# Patient Record
Sex: Male | Born: 1960 | Race: White | Hispanic: No | Smoking: Former smoker
Health system: Southern US, Community
[De-identification: ages and names within clinical notes are randomized; demographics above are authoritative.]

## PROBLEM LIST (undated history)

## (undated) DIAGNOSIS — N390 Urinary tract infection, site not specified: Secondary | ICD-10-CM

## (undated) DIAGNOSIS — I10 Essential (primary) hypertension: Secondary | ICD-10-CM

## (undated) DIAGNOSIS — E119 Type 2 diabetes mellitus without complications: Secondary | ICD-10-CM

## (undated) HISTORY — PX: FOOT AMPUTATION: SHX951

## (undated) HISTORY — PX: CORONARY ANGIOPLASTY WITH STENT PLACEMENT: SHX49

## (undated) HISTORY — PX: HERNIA REPAIR: SHX51

---

## 2007-07-24 ENCOUNTER — Emergency Department: Payer: Self-pay | Admitting: Emergency Medicine

## 2007-08-26 ENCOUNTER — Emergency Department: Payer: Self-pay | Admitting: Emergency Medicine

## 2008-01-19 ENCOUNTER — Emergency Department: Payer: Self-pay | Admitting: Emergency Medicine

## 2008-03-15 ENCOUNTER — Emergency Department: Payer: Self-pay | Admitting: Emergency Medicine

## 2009-04-25 ENCOUNTER — Emergency Department: Payer: Self-pay | Admitting: Emergency Medicine

## 2009-09-09 ENCOUNTER — Emergency Department: Payer: Self-pay

## 2010-02-23 ENCOUNTER — Emergency Department: Payer: Self-pay | Admitting: Emergency Medicine

## 2013-10-01 ENCOUNTER — Emergency Department: Payer: Self-pay | Admitting: Emergency Medicine

## 2013-10-01 LAB — URINALYSIS, COMPLETE
Bilirubin,UR: NEGATIVE
Glucose,UR: >=500 mg/dL (ref 0–75)
KETONE: NEGATIVE
Leukocyte Esterase: NEGATIVE
Nitrite: NEGATIVE
Ph: 5 (ref 4.5–8.0)
Protein: NEGATIVE
Specific Gravity: 1.032 (ref 1.003–1.030)
Squamous Epithelial: 1
WBC UR: <1 /HPF (ref 0–5)

## 2013-10-01 LAB — CBC WITH DIFFERENTIAL/PLATELET
BASOS ABS: 0 10*3/uL (ref 0.0–0.1)
Basophil %: 0.4 %
EOS PCT: 0.9 %
Eosinophil #: 0.1 10*3/uL (ref 0.0–0.7)
HCT: 43.9 % (ref 40.0–52.0)
HGB: 14.9 g/dL (ref 13.0–18.0)
Lymphocyte #: 1.9 10*3/uL (ref 1.0–3.6)
Lymphocyte %: 21 %
MCH: 33.1 pg (ref 26.0–34.0)
MCHC: 34.1 g/dL (ref 32.0–36.0)
MCV: 97 fL (ref 80–100)
MONO ABS: 0.6 x10 3/mm (ref 0.2–1.0)
Monocyte %: 7.1 %
Neutrophil #: 6.2 10*3/uL (ref 1.4–6.5)
Neutrophil %: 70.6 %
PLATELETS: 306 10*3/uL (ref 150–440)
RBC: 4.52 10*6/uL (ref 4.40–5.90)
RDW: 12.8 % (ref 11.5–14.5)
WBC: 8.8 10*3/uL (ref 3.8–10.6)

## 2013-10-01 LAB — COMPREHENSIVE METABOLIC PANEL
ALT: 23 U/L (ref 12–78)
AST: 14 U/L — AB (ref 15–37)
Albumin: 3.3 g/dL — ABNORMAL LOW (ref 3.4–5.0)
Alkaline Phosphatase: 129 U/L — ABNORMAL HIGH
Anion Gap: 4 — ABNORMAL LOW (ref 7–16)
BILIRUBIN TOTAL: 0.4 mg/dL (ref 0.2–1.0)
BUN: 13 mg/dL (ref 7–18)
Calcium, Total: 8.3 mg/dL — ABNORMAL LOW (ref 8.5–10.1)
Chloride: 100 mmol/L (ref 98–107)
Co2: 30 mmol/L (ref 21–32)
Creatinine: 0.84 mg/dL (ref 0.60–1.30)
EGFR (African American): >60
GLUCOSE: 490 mg/dL — AB (ref 65–99)
Osmolality: 290 (ref 275–301)
Potassium: 4 mmol/L (ref 3.5–5.1)
SODIUM: 134 mmol/L — AB (ref 136–145)
TOTAL PROTEIN: 7.3 g/dL (ref 6.4–8.2)

## 2013-10-01 LAB — D-DIMER(ARMC): D-Dimer: 231 ng/ml

## 2013-10-01 LAB — TROPONIN I: Troponin-I: <0.02 ng/mL

## 2019-07-16 ENCOUNTER — Encounter: Payer: Self-pay | Admitting: Emergency Medicine

## 2019-07-16 ENCOUNTER — Emergency Department
Admission: EM | Admit: 2019-07-16 | Discharge: 2019-07-16 | Disposition: A | Discharge status: Home / Self Care | Payer: Medicaid Other | Attending: Student | Admitting: Student

## 2019-07-16 ENCOUNTER — Other Ambulatory Visit: Payer: Self-pay

## 2019-07-16 DIAGNOSIS — Z760 Encounter for issue of repeat prescription: Secondary | ICD-10-CM | POA: Diagnosis not present

## 2019-07-16 DIAGNOSIS — Z794 Long term (current) use of insulin: Secondary | ICD-10-CM | POA: Insufficient documentation

## 2019-07-16 DIAGNOSIS — Z79899 Other long term (current) drug therapy: Secondary | ICD-10-CM | POA: Insufficient documentation

## 2019-07-16 DIAGNOSIS — Z7901 Long term (current) use of anticoagulants: Secondary | ICD-10-CM | POA: Insufficient documentation

## 2019-07-16 DIAGNOSIS — I10 Essential (primary) hypertension: Secondary | ICD-10-CM | POA: Insufficient documentation

## 2019-07-16 HISTORY — DX: Essential (primary) hypertension: I10

## 2019-07-16 MED ORDER — CLOPIDOGREL BISULFATE 75 MG PO TABS: 75.0000 mg | ORAL_TABLET | Freq: Every day | ORAL | 0 refills | Status: DC

## 2019-07-16 MED ORDER — FAMOTIDINE 20 MG PO TABS: 20.0000 mg | ORAL_TABLET | Freq: Every day | ORAL | 0 refills | Status: DC

## 2019-07-16 MED ORDER — TAMSULOSIN HCL 0.4 MG PO CAPS: 0.4000 mg | ORAL_CAPSULE | Freq: Once | ORAL | 0 refills | Status: AC

## 2019-07-16 NOTE — ED Notes (Signed)
See triage note  States he just moved he recently moved here   States he needs refills on couple of his meds   Did not bring the bottles  States he is on Flomax and Plavix

## 2019-07-16 NOTE — ED Provider Notes (Signed)
[facility] Emergency Department Provider Note  ____________________________________________  Time seen: Approximately 3:30 PM  I have reviewed the triage vital signs and the nursing notes.   HISTORY  Chief Complaint Medication Refill   HPI Samuel [name] is a 59 y.o. male presents to the emergency department for medication refill. He has been out of Plavix, Flomax, and stomach medicine for the past 2 weeks. He moved here from [place] and does not have a PCP. He denies new complaints today.   Past Medical History:  Diagnosis Date  . Hypertension     There are no problems to display for this patient.   History reviewed. No pertinent surgical history.  Prior to Admission medications   Medication Sig Start Date End Date Taking? Authorizing Provider  insulin aspart protamine- aspart (NOVOLOG MIX 70/30) (70-30) 100 UNIT/ML injection Inject into the skin.   Yes [provider]  clopidogrel (PLAVIX) 75 MG tablet Take 1 tablet (75 mg total) by mouth daily. 07/16/19   Batu Cassin, Rulon Eisenmenger B, FNP  famotidine (PEPCID) 20 MG tablet Take 1 tablet (20 mg total) by mouth daily. 07/16/19 07/15/20  Quentin Strebel, Rulon Eisenmenger B, FNP  tamsulosin (FLOMAX) 0.4 MG CAPS capsule Take 1 capsule (0.4 mg total) by mouth once for 1 dose. 07/16/19 07/16/19  Chinita Pester, FNP    Allergies Patient has no allergy information on record.  No family history on file.  Social History Social History   Tobacco Use  . Smoking status: Not on file  Substance Use Topics  . Alcohol use: Not on file  . Drug use: Not on file    Review of Systems Constitutional: Negative for fever. ENT: Negative for sore throat. Respiratory: Negative for shortness of breath. Gastrointestinal: No abdominal pain.  No nausea, no vomiting.  No diarrhea.  Musculoskeletal: Negative for generalized body aches. Skin: negative for rash/lesion/wound. Neurological: Negative for headaches, focal weakness or  numbness.  ____________________________________________   PHYSICAL EXAM:  VITAL SIGNS: ED Triage Vitals  Enc Vitals Group     BP 07/16/19 1518 (!) 142/94     Pulse Rate 07/16/19 1518 (!) 108     Resp 07/16/19 1518 20     Temp 07/16/19 1518 98.2 F (36.8 C)     Temp Source 07/16/19 1518 Oral     SpO2 07/16/19 1518 98 %     Weight 07/16/19 1516 205 lb (93 kg)     Height 07/16/19 1516 5\' 10"  (1.778 m)     Head Circumference --      Peak Flow --      Pain Score 07/16/19 1516 0     Pain Loc --      Pain Edu? --      Excl. in [place]? --     Constitutional: Alert and oriented. Well appearing and in no acute distress. Eyes: Conjunctivae are normal. PERRL. EOMI. Head: Atraumatic. Nose: No congestion/rhinnorhea. Mouth/Throat: Mucous membranes are moist. Neck: No stridor.  Cardiovascular: Normal rate, regular rhythm. Good peripheral circulation. Respiratory: Normal respiratory effort. Musculoskeletal: Full ROM throughout.  Neurologic:  Normal speech and language. No gross focal neurologic deficits are appreciated. Speech is normal. No gait instability. Skin:  Skin is warm, dry and intact. No rash noted. Psychiatric: Mood and affect are normal. Speech and behavior are normal.  ____________________________________________   LABS (all labs ordered are listed, but only abnormal results are displayed)  Labs Reviewed - No data to display ____________________________________________  EKG  Not indicated. ____________________________________________  RADIOLOGY  Not  indicated. ____________________________________________   PROCEDURES  None ____________________________________________   INITIAL IMPRESSION / ASSESSMENT AND PLAN / ED COURSE   Prescriptions submitted. Patient was provided information on community resources and medication management. He was advised that he will need to establish primary care as soon as possible.   Pertinent labs & imaging results that were  available during my care of the patient were reviewed by me and considered in my medical decision making (see chart for details).  ____________________________________________   FINAL CLINICAL IMPRESSION(S) / ED DIAGNOSES  Final diagnoses:  Encounter for medication refill       Victorino Dike, FNP 07/16/19 2005    Lilia Pro., MD 07/17/19 [phone]

## 2019-07-16 NOTE — ED Triage Notes (Signed)
Pt reports needs 4 meds refilled including topamax, 2 meds for HTN and one for thinning blood.

## 2019-07-16 NOTE — ED Triage Notes (Signed)
Called without answer.

## 2019-09-19 ENCOUNTER — Emergency Department
Admission: EM | Admit: 2019-09-19 | Discharge: 2019-09-19 | Disposition: A | Discharge status: Home / Self Care | Payer: Medicaid Other | Attending: Emergency Medicine | Admitting: Emergency Medicine

## 2019-09-19 ENCOUNTER — Encounter: Payer: Self-pay | Admitting: Emergency Medicine

## 2019-09-19 ENCOUNTER — Other Ambulatory Visit: Payer: Self-pay

## 2019-09-19 DIAGNOSIS — E1165 Type 2 diabetes mellitus with hyperglycemia: Secondary | ICD-10-CM

## 2019-09-19 DIAGNOSIS — I10 Essential (primary) hypertension: Secondary | ICD-10-CM | POA: Diagnosis not present

## 2019-09-19 DIAGNOSIS — Z955 Presence of coronary angioplasty implant and graft: Secondary | ICD-10-CM | POA: Insufficient documentation

## 2019-09-19 DIAGNOSIS — K6289 Other specified diseases of anus and rectum: Secondary | ICD-10-CM

## 2019-09-19 DIAGNOSIS — Z79899 Other long term (current) drug therapy: Secondary | ICD-10-CM | POA: Diagnosis not present

## 2019-09-19 DIAGNOSIS — E119 Type 2 diabetes mellitus without complications: Secondary | ICD-10-CM | POA: Diagnosis not present

## 2019-09-19 HISTORY — DX: Urinary tract infection, site not specified: N39.0

## 2019-09-19 HISTORY — DX: Type 2 diabetes mellitus without complications: E11.9

## 2019-09-19 LAB — GLUCOSE, CAPILLARY: Glucose-Capillary: 543 mg/dL (ref 70–99)

## 2019-09-19 MED ORDER — OXYCODONE-ACETAMINOPHEN 7.5-325 MG PO TABS: 1.0000 | ORAL_TABLET | Freq: Four times a day (QID) | ORAL | 0 refills | Status: DC | PRN

## 2019-09-19 MED ORDER — CIPROFLOXACIN HCL 500 MG PO TABS: 500.0000 mg | ORAL_TABLET | Freq: Two times a day (BID) | ORAL | 0 refills | Status: DC

## 2019-09-19 MED ORDER — METRONIDAZOLE 500 MG PO TABS: 500.0000 mg | ORAL_TABLET | Freq: Two times a day (BID) | ORAL | 0 refills | Status: DC

## 2019-09-19 MED ORDER — OXYCODONE HCL 5 MG PO TABS
5.0000 mg | ORAL_TABLET | Freq: Once | ORAL | Status: AC
  Administered 2019-09-19: 5 mg via ORAL
  Filled 2019-09-19: qty 1

## 2019-09-19 NOTE — ED Notes (Signed)
See triage note  Presents with possible abscess area to buttocks  States he felt a raised area in the "crack" of his butt

## 2019-09-19 NOTE — ED Triage Notes (Signed)
Patient presents to the ED and states he believes he has an abscess near his rectum.  Patient states it is at a place where he is having difficulty seeing it.  Patient states yesterday when he wiped he noticed puss.  Patient reports history of diabetes.

## 2019-09-19 NOTE — Discharge Instructions (Addendum)
Call make an appointment with Dr. Marvis Moeller to get your blood sugar under better control.  Also begin taking the antibiotics that were prescribed for you today.  You may want to obtain a probiotic to avoid diarrhea from the antibiotics.  Also begin using sits baths frequently which will also help with pain and swelling.  Take your insulin when you get home and pay close attention to what you are eating.  If there is any severe worsening of your symptoms return to the emergency department over the weekend.  A general surgeon was also listed on your discharge papers to make an appointment should you wish to have this area completely excised.

## 2019-09-19 NOTE — ED Provider Notes (Signed)
[facility] Emergency Department Provider Note  ____________________________________________   First MD Initiated Contact with Patient 09/19/19 [phone]     (approximate)  I have reviewed the triage vital signs and the nursing notes.   HISTORY  Chief Complaint Rectal Pain   HPI Samuel [name] is a 59 y.o. male presents to the ED with complaint of possible abscess near his rectum.  Patient states that there is a place that has been tender.  He states that he has had it lanced once before years ago.  He denies any fever or chills.  There is been no vomiting, diarrhea, change in stools.  Patient states when he is touching it that his pain is a 10/10.  Also patient is diabetic and states he has not taken his insulin today.  In talking with him he apparently has very little control of his diabetes.  He states is not unusual for his glucose to read 400-500 range.  He states he is seeing his PCP once in the last 3 months.       Past Medical History:  Diagnosis Date  . Diabetes mellitus without complication (HCC)   . Hypertension   . UTI (urinary tract infection)     There are no problems to display for this patient.   Past Surgical History:  Procedure Laterality Date  . CORONARY ANGIOPLASTY WITH STENT PLACEMENT    . FOOT AMPUTATION    . HERNIA REPAIR      Prior to Admission medications   Medication Sig Start Date End Date Taking? Authorizing Provider  amLODipine (NORVASC) 10 MG tablet Take 10 mg by mouth daily.   Yes [provider]  gabapentin (NEURONTIN) 300 MG capsule Take 300 mg by mouth 2 (two) times daily.   Yes [provider]  hydrochlorothiazide (HYDRODIURIL) 25 MG tablet Take 25 mg by mouth daily.   Yes [provider]  ciprofloxacin (CIPRO) 500 MG tablet Take 1 tablet (500 mg total) by mouth 2 (two) times daily. 09/19/19   Tommi Rumps, PA-C  clopidogrel (PLAVIX) 75 MG tablet Take 1 tablet (75 mg total) by mouth  daily. 07/16/19   Triplett, Rulon Eisenmenger B, FNP  famotidine (PEPCID) 20 MG tablet Take 1 tablet (20 mg total) by mouth daily. 07/16/19 07/15/20  Triplett, Rulon Eisenmenger B, FNP  insulin aspart protamine- aspart (NOVOLOG MIX 70/30) (70-30) 100 UNIT/ML injection Inject into the skin.    [provider]  metroNIDAZOLE (FLAGYL) 500 MG tablet Take 1 tablet (500 mg total) by mouth 2 (two) times daily. 09/19/19   Tommi Rumps, PA-C  oxyCODONE-acetaminophen (PERCOCET) 7.5-325 MG tablet Take 1 tablet by mouth every 6 (six) hours as needed for severe pain. 09/19/19 09/18/20  Tommi Rumps, PA-C    Allergies Patient has no known allergies.  No family history on file.  Social History Social History   Tobacco Use  . Smoking status: Never Smoker  . Smokeless tobacco: Never Used  Substance Use Topics  . Alcohol use: Not Currently  . Drug use: Not on file    Review of Systems Constitutional: No fever/chills Cardiovascular: Denies chest pain. Respiratory: Denies shortness of breath. Gastrointestinal: No abdominal pain.  No nausea, no vomiting.  No diarrhea.  No constipation.  Possible rectal abscess.  Positive history of rectal abscesses. Genitourinary: Negative for dysuria. Musculoskeletal: Negative for back pain. Skin: Negative for rash. Neurological: Negative for headaches, focal weakness or numbness. ____________________________________________   PHYSICAL EXAM:  VITAL SIGNS: ED Triage Vitals [09/19/19  0936]  Enc Vitals Group     BP      Pulse      Resp      Temp      Temp src      SpO2      Weight 210 lb (95.3 kg)     Height 5\' 9"  (1.753 m)     Head Circumference      Peak Flow      Pain Score 10     Pain Loc      Pain Edu?      Excl. in [place]?    Constitutional: Alert and oriented. Well appearing and in no acute distress. Eyes: Conjunctivae are normal.  Head: Atraumatic. Neck: No stridor.   Cardiovascular: Normal rate, regular rhythm. Grossly normal heart sounds.  Good peripheral  circulation. Respiratory: Normal respiratory effort.  No retractions. Lungs CTAB. Gastrointestinal: Soft and nontender. No distention.  Examination of the rectal area there is no gross deformity.  Patient points to an area that he states is very tender.  There is no erythema or warmth.  There is some tenderness on palpation at approximately 12 o'clock position but no abscess formation is noted.  Skin is intact. Musculoskeletal: No lower extremity tenderness nor edema.  No joint effusions. Neurologic:  Normal speech and language. No gross focal neurologic deficits are appreciated. No gait instability. Skin:  Skin is warm, dry and intact. No discoloration or warmth in the area. Psychiatric: Mood and affect are normal. Speech and behavior are normal.  ____________________________________________   LABS (all labs ordered are listed, but only abnormal results are displayed)  Labs Reviewed  GLUCOSE, CAPILLARY - Abnormal; Notable for the following components:      Result Value   Glucose-Capillary 543 (*)    All other components within normal limits  CBG MONITORING, ED     PROCEDURES  Procedure(s) performed (including Critical Care):  Procedures   ____________________________________________   INITIAL IMPRESSION / ASSESSMENT AND PLAN / ED COURSE  As part of my medical decision making, I reviewed the following data within the electronic MEDICAL RECORD NUMBER Notes from prior ED visits and [place] Controlled Substance Database  59 year old male presents to the ED with complaint of possible abscess near his rectum.  Patient has a history of the same.  He states that he just recently started with some discomfort.  He denies any fever or chills.  He states he is also diabetic and has not seen his doctor in last 3 months.  He states it is not unusual for his glucose levels to run between 400-500.  After a glucose fingerstick it was discussed that we would need to do a complete panel blood at which time  he said that he would not be able to stay as his right is getting ready to leave.  Patient was encouraged to follow-up with a surgeon especially if this continues to give him problems or return to the emergency department.  Glucose while in the ED fingerstick was 543.  Patient was afebrile.  A prescription for Cipro and Flagyl was sent to his pharmacy along with some pain medication.  He is encouraged to begin doing sitz bath's and warm compresses.  He is to return to the emergency department if any severe worsening of his symptoms or urgent concerns.   ____________________________________________   FINAL CLINICAL IMPRESSION(S) / ED DIAGNOSES  Final diagnoses:  Rectal pain  Hyperglycemia due to diabetes mellitus [facility])     ED Discharge Orders  Ordered    ciprofloxacin (CIPRO) 500 MG tablet  2 times daily     09/19/19 1035    metroNIDAZOLE (FLAGYL) 500 MG tablet  2 times daily     09/19/19 1035    oxyCODONE-acetaminophen (PERCOCET) 7.5-325 MG tablet  Every 6 hours PRN     09/19/19 1035           Note:  This document was prepared using Dragon voice recognition software and may include unintentional dictation errors.    Tommi Rumps, PA-C 09/19/19 1616    Minna Antis, MD 09/20/19 2050

## 2019-09-20 ENCOUNTER — Ambulatory Visit: Payer: Medicaid Other | Admitting: Anesthesiology

## 2019-09-20 ENCOUNTER — Other Ambulatory Visit
Admission: RE | Admit: 2019-09-20 | Discharge: 2019-09-20 | Disposition: A | Discharge status: Home / Self Care | Payer: Medicaid Other | Source: Ambulatory Visit | Attending: General Surgery | Admitting: General Surgery

## 2019-09-20 ENCOUNTER — Other Ambulatory Visit: Payer: Self-pay

## 2019-09-20 ENCOUNTER — Encounter
Admission: RE | Disposition: A | Discharge status: Home / Self Care | Payer: Self-pay | Source: Ambulatory Visit | Attending: General Surgery

## 2019-09-20 ENCOUNTER — Ambulatory Visit
Admission: RE | Admit: 2019-09-20 | Discharge: 2019-09-20 | Disposition: A | Discharge status: Home / Self Care | Payer: Medicaid Other | Source: Ambulatory Visit | Attending: General Surgery | Admitting: General Surgery

## 2019-09-20 ENCOUNTER — Encounter: Payer: Self-pay | Admitting: General Surgery

## 2019-09-20 ENCOUNTER — Ambulatory Visit: Payer: Self-pay | Admitting: General Surgery

## 2019-09-20 DIAGNOSIS — Z833 Family history of diabetes mellitus: Secondary | ICD-10-CM | POA: Diagnosis not present

## 2019-09-20 DIAGNOSIS — Z20822 Contact with and (suspected) exposure to covid-19: Secondary | ICD-10-CM | POA: Insufficient documentation

## 2019-09-20 DIAGNOSIS — Z955 Presence of coronary angioplasty implant and graft: Secondary | ICD-10-CM | POA: Insufficient documentation

## 2019-09-20 DIAGNOSIS — I10 Essential (primary) hypertension: Secondary | ICD-10-CM | POA: Diagnosis not present

## 2019-09-20 DIAGNOSIS — Z79899 Other long term (current) drug therapy: Secondary | ICD-10-CM | POA: Diagnosis not present

## 2019-09-20 DIAGNOSIS — K611 Rectal abscess: Secondary | ICD-10-CM | POA: Insufficient documentation

## 2019-09-20 DIAGNOSIS — Z7982 Long term (current) use of aspirin: Secondary | ICD-10-CM | POA: Insufficient documentation

## 2019-09-20 DIAGNOSIS — Z794 Long term (current) use of insulin: Secondary | ICD-10-CM | POA: Diagnosis not present

## 2019-09-20 DIAGNOSIS — E119 Type 2 diabetes mellitus without complications: Secondary | ICD-10-CM | POA: Diagnosis not present

## 2019-09-20 DIAGNOSIS — Z8249 Family history of ischemic heart disease and other diseases of the circulatory system: Secondary | ICD-10-CM | POA: Insufficient documentation

## 2019-09-20 DIAGNOSIS — Z791 Long term (current) use of non-steroidal anti-inflammatories (NSAID): Secondary | ICD-10-CM | POA: Insufficient documentation

## 2019-09-20 DIAGNOSIS — F17210 Nicotine dependence, cigarettes, uncomplicated: Secondary | ICD-10-CM | POA: Insufficient documentation

## 2019-09-20 DIAGNOSIS — Z7902 Long term (current) use of antithrombotics/antiplatelets: Secondary | ICD-10-CM | POA: Insufficient documentation

## 2019-09-20 HISTORY — PX: INCISION AND DRAINAGE PERIRECTAL ABSCESS: SHX1804

## 2019-09-20 LAB — COMPREHENSIVE METABOLIC PANEL
ALT: 14 U/L (ref 0–44)
AST: 13 U/L — ABNORMAL LOW (ref 15–41)
Albumin: 3.8 g/dL (ref 3.5–5.0)
Alkaline Phosphatase: 124 U/L (ref 38–126)
Anion gap: 9 (ref 5–15)
BUN: 16 mg/dL (ref 6–20)
CO2: 28 mmol/L (ref 22–32)
Calcium: 8.6 mg/dL — ABNORMAL LOW (ref 8.9–10.3)
Chloride: 97 mmol/L — ABNORMAL LOW (ref 98–111)
Creatinine, Ser: 1.07 mg/dL (ref 0.61–1.24)
GFR calc Af Amer: >60 mL/min (ref >60)
GFR calc non Af Amer: >60 mL/min (ref >60)
Glucose, Bld: 269 mg/dL — ABNORMAL HIGH (ref 70–99)
Potassium: 3.7 mmol/L (ref 3.5–5.1)
Sodium: 134 mmol/L — ABNORMAL LOW (ref 135–145)
Total Bilirubin: 0.3 mg/dL (ref 0.3–1.2)
Total Protein: 7.5 g/dL (ref 6.5–8.1)

## 2019-09-20 LAB — CBC WITH DIFFERENTIAL/PLATELET
Abs Immature Granulocytes: 0.04 10*3/uL (ref 0.00–0.07)
Basophils Absolute: 0.1 10*3/uL (ref 0.0–0.1)
Basophils Relative: 0 %
Eosinophils Absolute: 0.3 10*3/uL (ref 0.0–0.5)
Eosinophils Relative: 3 %
HCT: 40.9 % (ref 39.0–52.0)
Hemoglobin: 13.5 g/dL (ref 13.0–17.0)
Immature Granulocytes: 0 %
Lymphocytes Relative: 19 %
Lymphs Abs: 2.4 10*3/uL (ref 0.7–4.0)
MCH: 31.5 pg (ref 26.0–34.0)
MCHC: 33 g/dL (ref 30.0–36.0)
MCV: 95.3 fL (ref 80.0–100.0)
Monocytes Absolute: 0.9 10*3/uL (ref 0.1–1.0)
Monocytes Relative: 7 %
Neutro Abs: 9.1 10*3/uL — ABNORMAL HIGH (ref 1.7–7.7)
Neutrophils Relative %: 71 %
Platelets: 269 10*3/uL (ref 150–400)
RBC: 4.29 MIL/uL (ref 4.22–5.81)
RDW: 12 % (ref 11.5–15.5)
WBC: 12.9 10*3/uL — ABNORMAL HIGH (ref 4.0–10.5)
nRBC: 0 % (ref 0.0–0.2)

## 2019-09-20 LAB — GLUCOSE, CAPILLARY
Glucose-Capillary: 264 mg/dL — ABNORMAL HIGH (ref 70–99)
Glucose-Capillary: 228 mg/dL — ABNORMAL HIGH (ref 70–99)

## 2019-09-20 LAB — RESPIRATORY PANEL BY RT PCR (FLU A&B, COVID)
Influenza A by PCR: NEGATIVE
Influenza B by PCR: NEGATIVE
SARS Coronavirus 2 by RT PCR: NEGATIVE

## 2019-09-20 SURGERY — INCISION AND DRAINAGE, ABSCESS, PERIRECTAL
Anesthesia: General | Site: Rectum

## 2019-09-20 MED ORDER — FENTANYL CITRATE (PF) 100 MCG/2ML IJ SOLN
INTRAMUSCULAR | Status: AC
  Filled 2019-09-20: qty 2

## 2019-09-20 MED ORDER — PHENYLEPHRINE HCL (PRESSORS) 10 MG/ML IV SOLN
INTRAVENOUS | Status: AC
  Filled 2019-09-20: qty 1

## 2019-09-20 MED ORDER — CIPROFLOXACIN IN D5W 400 MG/200ML IV SOLN
INTRAVENOUS | Status: AC
  Filled 2019-09-20: qty 200

## 2019-09-20 MED ORDER — INSULIN ASPART 100 UNIT/ML ~~LOC~~ SOLN
SUBCUTANEOUS | Status: AC
  Administered 2019-09-20: 4 [IU] via SUBCUTANEOUS
  Filled 2019-09-20: qty 1

## 2019-09-20 MED ORDER — HYDROCODONE-ACETAMINOPHEN 5-325 MG PO TABS
1.0000 | ORAL_TABLET | Freq: Once | ORAL | Status: AC
  Administered 2019-09-20: 1 via ORAL

## 2019-09-20 MED ORDER — SODIUM CHLORIDE 0.9 % IV SOLN: INTRAVENOUS | Status: DC

## 2019-09-20 MED ORDER — MIDAZOLAM HCL 2 MG/2ML IJ SOLN
INTRAMUSCULAR | Status: AC
  Filled 2019-09-20: qty 2

## 2019-09-20 MED ORDER — KETOROLAC TROMETHAMINE 30 MG/ML IJ SOLN
INTRAMUSCULAR | Status: DC | PRN
  Administered 2019-09-20: 30 mg via INTRAVENOUS

## 2019-09-20 MED ORDER — FAMOTIDINE 20 MG PO TABS
ORAL_TABLET | ORAL | Status: AC
  Filled 2019-09-20: qty 1

## 2019-09-20 MED ORDER — SUGAMMADEX SODIUM 200 MG/2ML IV SOLN
INTRAVENOUS | Status: DC | PRN
  Administered 2019-09-20: 200 mg via INTRAVENOUS

## 2019-09-20 MED ORDER — BUPIVACAINE HCL (PF) 0.25 % IJ SOLN
INTRAMUSCULAR | Status: AC
  Filled 2019-09-20: qty 30

## 2019-09-20 MED ORDER — BUPIVACAINE-EPINEPHRINE 0.25% -1:200000 IJ SOLN
INTRAMUSCULAR | Status: DC | PRN
  Administered 2019-09-20: 30 mL

## 2019-09-20 MED ORDER — ONDANSETRON HCL 4 MG/2ML IJ SOLN: 4.0000 mg | Freq: Once | INTRAMUSCULAR | Status: AC

## 2019-09-20 MED ORDER — FAMOTIDINE 20 MG PO TABS
20.0000 mg | ORAL_TABLET | Freq: Once | ORAL | Status: AC
  Administered 2019-09-20: 20 mg via ORAL

## 2019-09-20 MED ORDER — PROPOFOL 10 MG/ML IV BOLUS
INTRAVENOUS | Status: DC | PRN
  Administered 2019-09-20: 170 mg via INTRAVENOUS

## 2019-09-20 MED ORDER — MEPERIDINE HCL 50 MG/ML IJ SOLN
INTRAMUSCULAR | Status: AC
  Administered 2019-09-20: 25 mg
  Filled 2019-09-20: qty 1

## 2019-09-20 MED ORDER — EPINEPHRINE PF 1 MG/ML IJ SOLN
INTRAMUSCULAR | Status: AC
  Filled 2019-09-20: qty 1

## 2019-09-20 MED ORDER — FENTANYL CITRATE (PF) 100 MCG/2ML IJ SOLN
25.0000 ug | INTRAMUSCULAR | Status: DC | PRN
  Administered 2019-09-20 (×3): 25 ug via INTRAVENOUS

## 2019-09-20 MED ORDER — ONDANSETRON HCL 4 MG/2ML IJ SOLN: 4.0000 mg | Freq: Once | INTRAMUSCULAR | Status: DC | PRN

## 2019-09-20 MED ORDER — HYDROCODONE-ACETAMINOPHEN 5-325 MG PO TABS
ORAL_TABLET | ORAL | Status: AC
  Filled 2019-09-20: qty 1

## 2019-09-20 MED ORDER — ACETAMINOPHEN 10 MG/ML IV SOLN
INTRAVENOUS | Status: AC
  Filled 2019-09-20: qty 100

## 2019-09-20 MED ORDER — ONDANSETRON HCL 4 MG/2ML IJ SOLN
INTRAMUSCULAR | Status: DC | PRN
  Administered 2019-09-20: 4 mg via INTRAVENOUS

## 2019-09-20 MED ORDER — FENTANYL CITRATE (PF) 100 MCG/2ML IJ SOLN
INTRAMUSCULAR | Status: DC | PRN
  Administered 2019-09-20: 100 ug via INTRAVENOUS

## 2019-09-20 MED ORDER — LIDOCAINE HCL (CARDIAC) PF 100 MG/5ML IV SOSY
PREFILLED_SYRINGE | INTRAVENOUS | Status: DC | PRN
  Administered 2019-09-20: 30 mg via INTRAVENOUS
  Administered 2019-09-20: 70 mg via INTRAVENOUS

## 2019-09-20 MED ORDER — INSULIN ASPART 100 UNIT/ML ~~LOC~~ SOLN: 4.0000 [IU] | Freq: Once | SUBCUTANEOUS | Status: AC

## 2019-09-20 MED ORDER — DEXAMETHASONE SODIUM PHOSPHATE 10 MG/ML IJ SOLN
INTRAMUSCULAR | Status: AC
  Filled 2019-09-20: qty 1

## 2019-09-20 MED ORDER — ONDANSETRON HCL 4 MG/2ML IJ SOLN
INTRAMUSCULAR | Status: AC
  Administered 2019-09-20: 4 mg via INTRAVENOUS
  Filled 2019-09-20: qty 2

## 2019-09-20 MED ORDER — HYDROGEN PEROXIDE 30 % SOLN
Status: DC | PRN
  Administered 2019-09-20: 14:00:00 100 mL

## 2019-09-20 MED ORDER — ACETAMINOPHEN 10 MG/ML IV SOLN
INTRAVENOUS | Status: DC | PRN
  Administered 2019-09-20: 1000 mg via INTRAVENOUS

## 2019-09-20 MED ORDER — KETOROLAC TROMETHAMINE 30 MG/ML IJ SOLN
INTRAMUSCULAR | Status: AC
  Filled 2019-09-20: qty 1

## 2019-09-20 MED ORDER — PROPOFOL 10 MG/ML IV BOLUS
INTRAVENOUS | Status: AC
  Filled 2019-09-20: qty 20

## 2019-09-20 MED ORDER — ONDANSETRON HCL 4 MG/2ML IJ SOLN
INTRAMUSCULAR | Status: AC
  Filled 2019-09-20: qty 2

## 2019-09-20 MED ORDER — METRONIDAZOLE IN NACL 5-0.79 MG/ML-% IV SOLN
500.0000 mg | INTRAVENOUS | Status: AC
  Administered 2019-09-20: 500 mg via INTRAVENOUS
  Filled 2019-09-20 (×2): qty 100

## 2019-09-20 MED ORDER — MEPERIDINE HCL 25 MG/ML IJ SOLN: 25.0000 mg | Freq: Once | INTRAMUSCULAR | Status: AC

## 2019-09-20 MED ORDER — MIDAZOLAM HCL 2 MG/2ML IJ SOLN
INTRAMUSCULAR | Status: DC | PRN
  Administered 2019-09-20: 2 mg via INTRAVENOUS

## 2019-09-20 MED ORDER — DEXAMETHASONE SODIUM PHOSPHATE 10 MG/ML IJ SOLN
INTRAMUSCULAR | Status: DC | PRN
  Administered 2019-09-20: 5 mg via INTRAVENOUS

## 2019-09-20 MED ORDER — PHENYLEPHRINE HCL (PRESSORS) 10 MG/ML IV SOLN
INTRAVENOUS | Status: DC | PRN
  Administered 2019-09-20: 100 ug via INTRAVENOUS

## 2019-09-20 MED ORDER — FENTANYL CITRATE (PF) 100 MCG/2ML IJ SOLN
INTRAMUSCULAR | Status: AC
  Administered 2019-09-20: 25 ug via INTRAVENOUS
  Filled 2019-09-20: qty 2

## 2019-09-20 MED ORDER — ROCURONIUM BROMIDE 100 MG/10ML IV SOLN
INTRAVENOUS | Status: DC | PRN
  Administered 2019-09-20: 40 mg via INTRAVENOUS

## 2019-09-20 MED ORDER — HYDROCODONE-ACETAMINOPHEN 5-325 MG PO TABS: 1.0000 | ORAL_TABLET | ORAL | 0 refills | Status: AC | PRN

## 2019-09-20 MED ORDER — CIPROFLOXACIN IN D5W 400 MG/200ML IV SOLN
400.0000 mg | INTRAVENOUS | Status: AC
  Administered 2019-09-20: 400 mg via INTRAVENOUS

## 2019-09-20 SURGICAL SUPPLY — 42 items
BLADE SURG SZ11 CARB STEEL (BLADE) ×2 IMPLANT
BRIEF STRETCH MATERNITY 2XLG (MISCELLANEOUS) ×2 IMPLANT
CANISTER SUCT 1200ML W/VALVE (MISCELLANEOUS) ×2 IMPLANT
COVER WAND RF STERILE (DRAPES) ×2 IMPLANT
DRAIN PENROSE 1/4X12 LTX STRL (WOUND CARE) IMPLANT
DRAPE LAPAROTOMY 100X77 ABD (DRAPES) ×2 IMPLANT
DRAPE LEGGINS SURG 28X43 STRL (DRAPES) ×2 IMPLANT
DRAPE UNDER BUTTOCK W/FLU (DRAPES) IMPLANT
DRSG AQUACEL ADVANTAGE 4X5 (GAUZE/BANDAGES/DRESSINGS) ×1 IMPLANT
ELECT REM PT RETURN 9FT ADLT (ELECTROSURGICAL) ×2
ELECTRODE REM PT RTRN 9FT ADLT (ELECTROSURGICAL) ×1 IMPLANT
GAUZE IODOFORM PACK 1/2 7832 (GAUZE/BANDAGES/DRESSINGS) ×2 IMPLANT
GLOVE BIO SURGEON STRL SZ 6.5 (GLOVE) ×2 IMPLANT
GLOVE BIOGEL PI IND STRL 6.5 (GLOVE) ×1 IMPLANT
GLOVE BIOGEL PI INDICATOR 6.5 (GLOVE) ×1
GOWN STRL REUS W/ TWL LRG LVL3 (GOWN DISPOSABLE) ×2 IMPLANT
GOWN STRL REUS W/TWL LRG LVL3 (GOWN DISPOSABLE) ×2
IV CATH ANGIO 12GX3 LT BLUE (NEEDLE) ×2 IMPLANT
KIT TURNOVER CYSTO (KITS) ×2 IMPLANT
LOOP RED MAXI  1X406MM (MISCELLANEOUS) ×1
LOOP VESSEL MAXI 1X406 RED (MISCELLANEOUS) ×1 IMPLANT
NDL HYPO 25X1 1.5 SAFETY (NEEDLE) ×1 IMPLANT
NDL SAFETY ECLIPSE 18X1.5 (NEEDLE) ×1 IMPLANT
NEEDLE HYPO 18GX1.5 SHARP (NEEDLE) ×1
NEEDLE HYPO 25X1 1.5 SAFETY (NEEDLE) ×2 IMPLANT
NS IRRIG 1000ML POUR BTL (IV SOLUTION) ×2 IMPLANT
PACK BASIN MINOR ARMC (MISCELLANEOUS) ×1 IMPLANT
PAD ABD DERMACEA PRESS 5X9 (GAUZE/BANDAGES/DRESSINGS) ×2 IMPLANT
PAD OB MATERNITY 4.3X12.25 (PERSONAL CARE ITEMS) ×2 IMPLANT
PAD PREP 24X41 OB/GYN DISP (PERSONAL CARE ITEMS) ×2 IMPLANT
SET YANKAUER POOLE SUCT (MISCELLANEOUS) ×1 IMPLANT
SOL PREP PVP 2OZ (MISCELLANEOUS) ×2
SOLUTION PREP PVP 2OZ (MISCELLANEOUS) ×1 IMPLANT
SURGILUBE 2OZ TUBE FLIPTOP (MISCELLANEOUS) ×2 IMPLANT
SUT ETHILON 3-0 FS-10 30 BLK (SUTURE)
SUTURE EHLN 3-0 FS-10 30 BLK (SUTURE) IMPLANT
SWAB DUAL CULTURE TRANS RED ST (MISCELLANEOUS) IMPLANT
SYR 10ML LL (SYRINGE) ×4 IMPLANT
SYR 20ML LL LF (SYRINGE) IMPLANT
SYR BULB IRRIG 60ML STRL (SYRINGE) ×2 IMPLANT
TOWEL OR 17X26 4PK STRL BLUE (TOWEL DISPOSABLE) ×2 IMPLANT
TUBING CONNECTING 10 (TUBING) ×1 IMPLANT

## 2019-09-20 NOTE — Anesthesia Procedure Notes (Signed)
Performed by: Rease Wence, CRNA       

## 2019-09-20 NOTE — H&P (Signed)
PATIENT PROFILE: Samuel [name] is a 59 y.o. male who presents to the Clinic for consultation at the request of Dr. Lenard Lance for evaluation of peri rectal abscess.  PCP:  [facility]  HISTORY OF PRESENT ILLNESS: Samuel [name] reports having pain in the perianal area since 3 days ago.  He reported that the pain continued to get worse.  The pain does not radiate to other part of the body.  There has been no alleviating factor.  Aggravating factor is applying pressure on the area.  Patient reports that he cannot sit down because of the pain.  She reports pain is 10 out of 10.  He denies fever or chills.  The patient went to the ED yesterday.  As per ED provider it was reported as tenderness on the perianal area without any skin changes.  Patient was discharged with oral antibiotic therapy and recommended to follow-up with surgeon.  The patient has been very concerned about the situation and he called right after discharge from the ED and made an appointment for today.   PROBLEM LIST: Diabetes Perianal abscess Hypertension  GENERAL REVIEW OF SYSTEMS:   General ROS: negative for - chills, fatigue, fever, weight gain or weight loss Allergy and Immunology ROS: negative for - hives  Hematological and Lymphatic ROS: negative for - bleeding problems or bruising, negative for palpable nodes Endocrine ROS: negative for - heat or cold intolerance, hair changes Respiratory ROS: negative for - cough, shortness of breath or wheezing Cardiovascular ROS: no chest pain or palpitations GI ROS: negative for nausea, vomiting, abdominal pain, diarrhea, constipation Musculoskeletal ROS: negative for - joint swelling or muscle pain Neurological ROS: negative for - confusion, syncope Dermatological ROS: negative for pruritus and rash Psychiatric: negative for anxiety, depression, difficulty sleeping and memory loss  MEDICATIONS: Current Medications        Current Outpatient Medications  Medication  Sig Dispense Refill  . amLODIPine (NORVASC) 10 MG tablet Take 10 mg by mouth once daily    . amlodipine-celecoxib 10-200 mg Tab Take by mouth once daily    . aspirin 81 MG EC tablet Take 81 mg by mouth once daily    . ciprofloxacin HCl (CIPRO) 500 MG tablet Take 500 mg by mouth 2 (two) times daily    . clopidogreL (PLAVIX) 75 mg tablet Take 75 mg by mouth once daily    . famotidine (PEPCID) 20 MG tablet Take 20 mg by mouth 2 (two) times daily    . gabapentin (NEURONTIN) 300 MG capsule Take 300 mg by mouth 2 (two) times daily    . hydroCHLOROthiazide (HYDRODIURIL) 25 MG tablet Take 25 mg by mouth once daily    . insulin ASPART PROTAMINE-ASPART (NOVOLOG MIX FLEXPEN 70/30) 100 unit/mL (70-30) pen injector Inject subcutaneously 2 (two) times daily before meals    . metroNIDAZOLE (FLAGYL) 500 MG tablet Take 500 mg by mouth 2 (two) times daily    . oxyCODONE-acetaminophen (PERCOCET) 7.5-325 mg tablet Take 1 tablet by mouth every 6 (six) hours as needed for Pain    . tamsulosin (FLOMAX) 0.4 mg capsule Take 0.4 mg by mouth once daily Take 30 minutes after same meal each day.     No current facility-administered medications for this visit.       ALLERGIES: Patient has no known allergies.  PAST MEDICAL HISTORY: No past medical history on file.  PAST SURGICAL HISTORY:      Past Surgical History:  Procedure Laterality Date  . heart stent N/A   .  HERNIA REPAIR    . partial foot amputation - right Right      FAMILY HISTORY:      Family History  Problem Relation Age of Onset  . Diabetes Mother   . High blood pressure (Hypertension) Mother   . Coronary Artery Disease (Blocked arteries around heart) Father   . Diabetes Father   . Osteoarthritis Father      SOCIAL HISTORY: Social History          Socioeconomic History  . Marital status: Divorced    Spouse name: Not on file  . Number of children: Not on file  . Years of education: Not on  file  . Highest education level: Not on file  Occupational History  . Not on file  Social Needs  . Financial resource strain: Not on file  . Food insecurity    Worry: Not on file    Inability: Not on file  . Transportation needs    Medical: Not on file    Non-medical: Not on file  Tobacco Use  . Smoking status: Current Every Day Smoker    Types: Cigarettes  . Smokeless tobacco: Never Used  Substance and Sexual Activity  . Alcohol use: Not Currently  . Drug use: Not Currently  . Sexual activity: Not on file  Other Topics Concern  . Not on file  Social History Narrative  . Not on file      PHYSICAL EXAM:    Vitals:   09/20/19 0806  BP: 157/85  Pulse: 97   Body mass index is 31.01 kg/m. Weight: 95.3 kg (210 lb)   GENERAL: Alert, active, oriented x3  HEENT: Pupils equal reactive to light. Extraocular movements are intact. Sclera clear. Palpebral conjunctiva normal red color.Pharynx clear.  NECK: Supple with no palpable mass and no adenopathy.  LUNGS: Sound clear with no rales rhonchi or wheezes.  HEART: Regular rhythm S1 and S2 without murmur.  ABDOMEN: Soft and depressible, nontender with no palpable mass, no hepatomegaly.  RECTAL: Very tender to palpation in the anterior and right perianal area.  Tender digital rectal exam.  There is cellulitis around the perianal and perineal area.  There is no drainage.  EXTREMITIES: Dry skin, no palpable pulses.  No active ulceration.  NEUROLOGICAL: Awake alert oriented, facial expression symmetrical, moving all extremities.  REVIEW OF DATA: I have reviewed the following data today: No results found for any previous visit.     ASSESSMENT: Samuel [name] is a 59 y.o. male presenting for consultation for perianal abscess.  Patient with a perianal extending perirectal abscess.  Patient very tender to palpation.  There are a significant cellulitis around the perianal area.  Area is warm to touch.  Due  to patient history of uncontrolled diabetes and grain perirectal abscess I think that this patient needs emergent surgical management of perianal abscess to avoid further progression to sepsis and Fournier gangrene.  We will do CBC and CMP and coordinate surgery soon as possible in the OR.  Patient understood the risks of surgery and agreed to proceed.  Peri-rectal abscess [K61.1]  PLAN: 1. Incision and drainage of perirectal abscess today (35573) 2. CBC, CMP stat  Patient verbalized understanding, all questions were answered, and were agreeable with the plan outlined above.     Herbert Pun, MD  Electronically signed by Herbert Pun, MD

## 2019-09-20 NOTE — Op Note (Signed)
Preoperative diagnosis: Perirectalabscess.    Postoperative diagnosis: Perirectal abscess.  Procedure: Anoscopy, drainage of perirectal abscess.    Surgeon: Dr. Hazle Quant  Anesthesia: Spinal  Indications: Patient is a 59 y.o. malewas found to have palpable perirectal abscess.   Findings: 1. Perirectal abscess at 10 o'clock 2. No necrotic tissue.  3. No fistula identified.   Description of procedure: The patient was brought to the operating room and general anesthesia was induced. The patient was then repositioned in lithotomy position. A time-out was completed verifying correct patient, procedure, site, positioning, and implant(s) and/or special equipment prior to beginning this procedure. A rectal examination was performed and the abscess was identified. The patient was prepped and draped in the usual sterile fashion. Local anesthetic was injected as a perianal block. Anoscopy was performed. The area of swelling was aspirated using an 18-gauge needle and the presence of pus confirmed.  The incision was made as closest as possible to the anal area and pus allowed to drain. Loculations were broken with a hemostat. The cavity was irrigated and left open. Cavity packed with Aquacel.    The patient tolerated the procedure well and was extubated and taken to the postanesthesia care unit in stable condition.   Specimen: None  Complications: None  Estimated Blood Loss: 5 mL

## 2019-09-20 NOTE — Progress Notes (Signed)
Inpatient Diabetes Program Recommendations  AACE/ADA: New Consensus Statement on Inpatient Glycemic Control (2015)  Target Ranges:  Prepandial:   less than 140 mg/dL      Peak postprandial:   less than 180 mg/dL (1-2 hours)      Critically ill patients:  140 - 180 mg/dL   Lab Results  Component Value Date   GLUCAP 264 (H) 09/20/2019    Review of Glycemic Control  Diabetes history: DM2 Outpatient Diabetes medications: Levemir 22 units bid Current orders for Inpatient glycemic control: None  Inpatient Diabetes Program Recommendations:    Patient currently in the OR. If patient admitted: Noted lab glucose 543 and today CBG 264. Levemir 10 units bid (50% home insulin dose) but may require more. -Novolog meal 3 units tid if eats 50% -Novolog moderate correction tid + hs 0-5 units  Thank you, Darel Hong E. Yaquelin Langelier, RN, MSN, CDE  Diabetes Coordinator Inpatient Glycemic Control Team Team Pager [phone] (8am-5pm) 09/20/2019 1:24 PM

## 2019-09-20 NOTE — Interval H&P Note (Signed)
History and Physical Interval Note:  09/20/2019 12:44 PM  Samuel [name]  has presented today for surgery, with the diagnosis of Perirectal Abscess.  The various methods of treatment have been discussed with the patient and family. After consideration of risks, benefits and other options for treatment, the patient has consented to  Procedure(s): IRRIGATION AND DEBRIDEMENT PERIRECTAL ABSCESS (N/A) as a surgical intervention.  The patient's history has been reviewed, patient examined, no change in status, stable for surgery.  I have reviewed the patient's chart and labs.  Questions were answered to the patient's satisfaction.     Carolan Shiver

## 2019-09-20 NOTE — Anesthesia Procedure Notes (Addendum)
Procedure Name: Intubation Date/Time: 09/20/2019 12:42 PM Performed by: Lynden Oxford, CRNA Pre-anesthesia Checklist: Patient identified, Emergency Drugs available, Suction available, Patient being monitored and Timeout performed Patient Re-evaluated:Patient Re-evaluated prior to induction Oxygen Delivery Method: Circle system utilized Preoxygenation: Pre-oxygenation with 100% oxygen Induction Type: IV induction Laryngoscope Size: McGraph and 4 Grade View: Grade I Tube type: Oral Tube size: 7.5 mm Number of attempts: 1 Airway Equipment and Method: Stylet Placement Confirmation: ETT inserted through vocal cords under direct vision Secured at: 23 cm Tube secured with: Tape Dental Injury: Teeth and Oropharynx as per pre-operative assessment

## 2019-09-20 NOTE — Discharge Instructions (Signed)
      AMBULATORY SURGERY  DISCHARGE INSTRUCTIONS   1) The drugs that you were given will stay in your system until tomorrow so for the next 24 hours you should not:  A) Drive an automobile B) Make any legal decisions C) Drink any alcoholic beverage   2) You may resume regular meals tomorrow.  Today it is better to start with liquids and gradually work up to solid foods.  You may eat anything you prefer, but it is better to start with liquids, then soup and crackers, and gradually work up to solid foods.   3) Please notify your doctor immediately if you have any unusual bleeding, trouble breathing, redness and pain at the surgery site, drainage, fever, or pain not relieved by medication.    4) Additional Instructions:        Please contact your physician with any problems or Same Day Surgery at [phone], Monday through Friday 6 am to 4 pm, or [place] at [facility] [place] number at [phone]. Diet: Resume home heart healthy regular diet.   Activity: Increase activity as tolerated, light activity and walking are encouraged. Do not drive or drink alcohol if taking narcotic pain medications.  Wound care: Remove packing tomorrow. Once packing removed removed, may shower with soapy water and pat dry (do not rub incisions), but no baths or submerging incision underwater until follow-up. (no swimming)   Medications: Resume all home medications. For mild to moderate pain: acetaminophen (Tylenol) or ibuprofen (if no kidney disease). Combining Tylenol with alcohol can substantially increase your risk of causing liver disease. Narcotic pain medications, if prescribed, can be used for severe pain, though may cause nausea, constipation, and drowsiness. Do not combine Tylenol and Norco within a 6 hour period as Norco contains Tylenol. If you do not need the narcotic pain medication, you do not need to fill the prescription.  Continue taking antibiotic as prescribed.   Call office  [phone]) at any time if any questions, worsening pain, fevers/chills, bleeding, drainage from incision site, or other concerns.

## 2019-09-20 NOTE — H&P (View-Only) (Signed)
PATIENT PROFILE: Samuel [name] is a 59 y.o. male who presents to the Clinic for consultation at the request of Dr. Lenard Lance for evaluation of peri rectal abscess.  PCP:  [facility]  HISTORY OF PRESENT ILLNESS: Samuel [name] reports having pain in the perianal area since 3 days ago.  He reported that the pain continued to get worse.  The pain does not radiate to other part of the body.  There has been no alleviating factor.  Aggravating factor is applying pressure on the area.  Patient reports that he cannot sit down because of the pain.  She reports pain is 10 out of 10.  He denies fever or chills.  The patient went to the ED yesterday.  As per ED provider it was reported as tenderness on the perianal area without any skin changes.  Patient was discharged with oral antibiotic therapy and recommended to follow-up with surgeon.  The patient has been very concerned about the situation and he called right after discharge from the ED and made an appointment for today.   PROBLEM LIST: Diabetes Perianal abscess Hypertension  GENERAL REVIEW OF SYSTEMS:   General ROS: negative for - chills, fatigue, fever, weight gain or weight loss Allergy and Immunology ROS: negative for - hives  Hematological and Lymphatic ROS: negative for - bleeding problems or bruising, negative for palpable nodes Endocrine ROS: negative for - heat or cold intolerance, hair changes Respiratory ROS: negative for - cough, shortness of breath or wheezing Cardiovascular ROS: no chest pain or palpitations GI ROS: negative for nausea, vomiting, abdominal pain, diarrhea, constipation Musculoskeletal ROS: negative for - joint swelling or muscle pain Neurological ROS: negative for - confusion, syncope Dermatological ROS: negative for pruritus and rash Psychiatric: negative for anxiety, depression, difficulty sleeping and memory loss  MEDICATIONS: Current Medications        Current Outpatient Medications  Medication  Sig Dispense Refill  . amLODIPine (NORVASC) 10 MG tablet Take 10 mg by mouth once daily    . amlodipine-celecoxib 10-200 mg Tab Take by mouth once daily    . aspirin 81 MG EC tablet Take 81 mg by mouth once daily    . ciprofloxacin HCl (CIPRO) 500 MG tablet Take 500 mg by mouth 2 (two) times daily    . clopidogreL (PLAVIX) 75 mg tablet Take 75 mg by mouth once daily    . famotidine (PEPCID) 20 MG tablet Take 20 mg by mouth 2 (two) times daily    . gabapentin (NEURONTIN) 300 MG capsule Take 300 mg by mouth 2 (two) times daily    . hydroCHLOROthiazide (HYDRODIURIL) 25 MG tablet Take 25 mg by mouth once daily    . insulin ASPART PROTAMINE-ASPART (NOVOLOG MIX FLEXPEN 70/30) 100 unit/mL (70-30) pen injector Inject subcutaneously 2 (two) times daily before meals    . metroNIDAZOLE (FLAGYL) 500 MG tablet Take 500 mg by mouth 2 (two) times daily    . oxyCODONE-acetaminophen (PERCOCET) 7.5-325 mg tablet Take 1 tablet by mouth every 6 (six) hours as needed for Pain    . tamsulosin (FLOMAX) 0.4 mg capsule Take 0.4 mg by mouth once daily Take 30 minutes after same meal each day.     No current facility-administered medications for this visit.       ALLERGIES: Patient has no known allergies.  PAST MEDICAL HISTORY: No past medical history on file.  PAST SURGICAL HISTORY:      Past Surgical History:  Procedure Laterality Date  . heart stent N/A   .  HERNIA REPAIR    . partial foot amputation - right Right      FAMILY HISTORY:      Family History  Problem Relation Age of Onset  . Diabetes Mother   . High blood pressure (Hypertension) Mother   . Coronary Artery Disease (Blocked arteries around heart) Father   . Diabetes Father   . Osteoarthritis Father      SOCIAL HISTORY: Social History          Socioeconomic History  . Marital status: Divorced    Spouse name: Not on file  . Number of children: Not on file  . Years of education: Not on  file  . Highest education level: Not on file  Occupational History  . Not on file  Social Needs  . Financial resource strain: Not on file  . Food insecurity    Worry: Not on file    Inability: Not on file  . Transportation needs    Medical: Not on file    Non-medical: Not on file  Tobacco Use  . Smoking status: Current Every Day Smoker    Types: Cigarettes  . Smokeless tobacco: Never Used  Substance and Sexual Activity  . Alcohol use: Not Currently  . Drug use: Not Currently  . Sexual activity: Not on file  Other Topics Concern  . Not on file  Social History Narrative  . Not on file      PHYSICAL EXAM:    Vitals:   09/20/19 0806  BP: 157/85  Pulse: 97   Body mass index is 31.01 kg/m. Weight: 95.3 kg (210 lb)   GENERAL: Alert, active, oriented x3  HEENT: Pupils equal reactive to light. Extraocular movements are intact. Sclera clear. Palpebral conjunctiva normal red color.Pharynx clear.  NECK: Supple with no palpable mass and no adenopathy.  LUNGS: Sound clear with no rales rhonchi or wheezes.  HEART: Regular rhythm S1 and S2 without murmur.  ABDOMEN: Soft and depressible, nontender with no palpable mass, no hepatomegaly.  RECTAL: Very tender to palpation in the anterior and right perianal area.  Tender digital rectal exam.  There is cellulitis around the perianal and perineal area.  There is no drainage.  EXTREMITIES: Dry skin, no palpable pulses.  No active ulceration.  NEUROLOGICAL: Awake alert oriented, facial expression symmetrical, moving all extremities.  REVIEW OF DATA: I have reviewed the following data today: No results found for any previous visit.     ASSESSMENT: Samuel [name] is a 59 y.o. male presenting for consultation for perianal abscess.  Patient with a perianal extending perirectal abscess.  Patient very tender to palpation.  There are a significant cellulitis around the perianal area.  Area is warm to touch.  Due  to patient history of uncontrolled diabetes and grain perirectal abscess I think that this patient needs emergent surgical management of perianal abscess to avoid further progression to sepsis and Fournier gangrene.  We will do CBC and CMP and coordinate surgery soon as possible in the OR.  Patient understood the risks of surgery and agreed to proceed.  Peri-rectal abscess [K61.1]  PLAN: 1. Incision and drainage of perirectal abscess today (46040) 2. CBC, CMP stat  Patient verbalized understanding, all questions were answered, and were agreeable with the plan outlined above.     Isaak Delmundo Cintron-Diaz, MD  Electronically signed by Daquisha Clermont Cintron-Diaz, MD  

## 2019-09-20 NOTE — Transfer of Care (Signed)
Immediate Anesthesia Transfer of Care Note  Patient: Samuel [name]  Procedure(s) Performed: IRRIGATION AND DEBRIDEMENT PERIRECTAL ABSCESS (N/A Rectum)  Patient Location: PACU  Anesthesia Type:General  Level of Consciousness: drowsy  Airway & Oxygen Therapy: Patient Spontanous Breathing and Patient connected to face mask oxygen  Post-op Assessment: Report given to RN and Post -op Vital signs reviewed and stable  Post vital signs: Reviewed and stable  Last Vitals:  Vitals Value Taken Time  BP 174/125 09/20/19 1353  Temp 36.4 C 09/20/19 1352  Pulse 92 09/20/19 1353  Resp 15 09/20/19 1353  SpO2 100 % 09/20/19 1353    Last Pain:  Vitals:   09/20/19 1014  TempSrc: Temporal  PainSc: 10-Worst pain ever         Complications: No apparent anesthesia complications

## 2019-09-20 NOTE — Anesthesia Preprocedure Evaluation (Signed)
Anesthesia Evaluation  Patient identified by MRN, date of birth, ID band Patient awake    Reviewed: Allergy & Precautions, NPO status , Patient's Chart, lab work & pertinent test results  Airway Mallampati: II  TM Distance: >3 FB     Dental  (+) Poor Dentition, Missing   Pulmonary neg pulmonary ROS, Patient abstained from smoking.,    Pulmonary exam normal        Cardiovascular hypertension, + CAD  Normal cardiovascular exam     Neuro/Psych negative neurological ROS  negative psych ROS   GI/Hepatic Neg liver ROS,   Endo/Other  diabetes  Renal/GU negative Renal ROS  negative genitourinary   Musculoskeletal negative musculoskeletal ROS (+)   Abdominal Normal abdominal exam  (+)   Peds negative pediatric ROS (+)  Hematology negative hematology ROS (+)   Anesthesia Other Findings Past Medical History: No date: Diabetes mellitus without complication (HCC) No date: Hypertension No date: UTI (urinary tract infection)  Reproductive/Obstetrics                             Anesthesia Physical Anesthesia Plan  ASA: II  Anesthesia Plan: General   Post-op Pain Management:    Induction: Intravenous  PONV Risk Score and Plan:   Airway Management Planned: Oral ETT  Additional Equipment:   Intra-op Plan:   Post-operative Plan: Extubation in OR  Informed Consent: I have reviewed the patients History and Physical, chart, labs and discussed the procedure including the risks, benefits and alternatives for the proposed anesthesia with the patient or authorized representative who has indicated his/her understanding and acceptance.     Dental advisory given  Plan Discussed with: CRNA and Surgeon  Anesthesia Plan Comments:         Anesthesia Quick Evaluation

## 2019-09-23 NOTE — Anesthesia Postprocedure Evaluation (Signed)
Anesthesia Post Note  Patient: Samuel [name]  Procedure(s) Performed: IRRIGATION AND DEBRIDEMENT PERIRECTAL ABSCESS (N/A Rectum)  Patient location during evaluation: PACU Anesthesia Type: General Level of consciousness: sedated Pain management: pain level controlled Vital Signs Assessment: post-procedure vital signs reviewed and stable Respiratory status: spontaneous breathing Cardiovascular status: blood pressure returned to baseline Anesthetic complications: no     Last Vitals:  Vitals:   09/20/19 1502 09/20/19 1525  BP: (!) 174/91 (!) 167/82  Pulse: 91 88  Resp: 16 16  Temp: (!) 36.2 C (!) 36.3 C  SpO2: 97% 98%    Last Pain:  Vitals:   09/20/19 1525  TempSrc: Temporal  PainSc: 3                  Lanise Mergen

## 2019-09-24 LAB — AEROBIC/ANAEROBIC CULTURE W GRAM STAIN (SURGICAL/DEEP WOUND)

## 2019-10-18 ENCOUNTER — Encounter: Payer: Self-pay | Admitting: *Deleted

## 2019-10-18 ENCOUNTER — Other Ambulatory Visit: Payer: Self-pay

## 2019-10-18 ENCOUNTER — Emergency Department: Payer: Medicaid Other

## 2019-10-18 ENCOUNTER — Inpatient Hospital Stay
Admission: EM | Admit: 2019-10-18 | Discharge: 2019-10-26 | DRG: 659 | Disposition: A | Discharge status: Other Acute Inpatient Hospital | Payer: Medicaid Other | Attending: Internal Medicine | Admitting: Internal Medicine

## 2019-10-18 DIAGNOSIS — Z794 Long term (current) use of insulin: Secondary | ICD-10-CM

## 2019-10-18 DIAGNOSIS — N10 Acute tubulo-interstitial nephritis: Secondary | ICD-10-CM | POA: Diagnosis not present

## 2019-10-18 DIAGNOSIS — I169 Hypertensive crisis, unspecified: Secondary | ICD-10-CM | POA: Diagnosis present

## 2019-10-18 DIAGNOSIS — Z7902 Long term (current) use of antithrombotics/antiplatelets: Secondary | ICD-10-CM

## 2019-10-18 DIAGNOSIS — E559 Vitamin D deficiency, unspecified: Secondary | ICD-10-CM | POA: Diagnosis present

## 2019-10-18 DIAGNOSIS — K319 Disease of stomach and duodenum, unspecified: Secondary | ICD-10-CM | POA: Diagnosis present

## 2019-10-18 DIAGNOSIS — E1142 Type 2 diabetes mellitus with diabetic polyneuropathy: Secondary | ICD-10-CM | POA: Diagnosis present

## 2019-10-18 DIAGNOSIS — E1165 Type 2 diabetes mellitus with hyperglycemia: Secondary | ICD-10-CM | POA: Diagnosis present

## 2019-10-18 DIAGNOSIS — R339 Retention of urine, unspecified: Secondary | ICD-10-CM | POA: Diagnosis not present

## 2019-10-18 DIAGNOSIS — K5900 Constipation, unspecified: Secondary | ICD-10-CM | POA: Diagnosis present

## 2019-10-18 DIAGNOSIS — N179 Acute kidney failure, unspecified: Secondary | ICD-10-CM

## 2019-10-18 DIAGNOSIS — K2210 Ulcer of esophagus without bleeding: Secondary | ICD-10-CM | POA: Diagnosis present

## 2019-10-18 DIAGNOSIS — I2510 Atherosclerotic heart disease of native coronary artery without angina pectoris: Secondary | ICD-10-CM | POA: Diagnosis present

## 2019-10-18 DIAGNOSIS — E1100 Type 2 diabetes mellitus with hyperosmolarity without nonketotic hyperglycemic-hyperosmolar coma (NKHHC): Secondary | ICD-10-CM | POA: Diagnosis present

## 2019-10-18 DIAGNOSIS — Z87442 Personal history of urinary calculi: Secondary | ICD-10-CM

## 2019-10-18 DIAGNOSIS — F22 Delusional disorders: Secondary | ICD-10-CM | POA: Diagnosis not present

## 2019-10-18 DIAGNOSIS — K2970 Gastritis, unspecified, without bleeding: Secondary | ICD-10-CM | POA: Diagnosis present

## 2019-10-18 DIAGNOSIS — F419 Anxiety disorder, unspecified: Secondary | ICD-10-CM | POA: Diagnosis present

## 2019-10-18 DIAGNOSIS — F329 Major depressive disorder, single episode, unspecified: Secondary | ICD-10-CM | POA: Diagnosis present

## 2019-10-18 DIAGNOSIS — R45851 Suicidal ideations: Secondary | ICD-10-CM | POA: Diagnosis present

## 2019-10-18 DIAGNOSIS — N202 Calculus of kidney with calculus of ureter: Principal | ICD-10-CM | POA: Diagnosis present

## 2019-10-18 DIAGNOSIS — Z89439 Acquired absence of unspecified foot: Secondary | ICD-10-CM

## 2019-10-18 DIAGNOSIS — E1110 Type 2 diabetes mellitus with ketoacidosis without coma: Secondary | ICD-10-CM

## 2019-10-18 DIAGNOSIS — Z87891 Personal history of nicotine dependence: Secondary | ICD-10-CM

## 2019-10-18 DIAGNOSIS — Z20822 Contact with and (suspected) exposure to covid-19: Secondary | ICD-10-CM | POA: Diagnosis present

## 2019-10-18 DIAGNOSIS — Z79899 Other long term (current) drug therapy: Secondary | ICD-10-CM

## 2019-10-18 DIAGNOSIS — N201 Calculus of ureter: Secondary | ICD-10-CM

## 2019-10-18 DIAGNOSIS — E11649 Type 2 diabetes mellitus with hypoglycemia without coma: Secondary | ICD-10-CM | POA: Diagnosis not present

## 2019-10-18 DIAGNOSIS — F29 Unspecified psychosis not due to a substance or known physiological condition: Secondary | ICD-10-CM

## 2019-10-18 DIAGNOSIS — N200 Calculus of kidney: Secondary | ICD-10-CM

## 2019-10-18 DIAGNOSIS — K2100 Gastro-esophageal reflux disease with esophagitis, without bleeding: Secondary | ICD-10-CM | POA: Diagnosis present

## 2019-10-18 DIAGNOSIS — E860 Dehydration: Secondary | ICD-10-CM | POA: Diagnosis present

## 2019-10-18 DIAGNOSIS — Z955 Presence of coronary angioplasty implant and graft: Secondary | ICD-10-CM

## 2019-10-18 DIAGNOSIS — B370 Candidal stomatitis: Secondary | ICD-10-CM | POA: Diagnosis present

## 2019-10-18 DIAGNOSIS — Z9114 Patient's other noncompliance with medication regimen: Secondary | ICD-10-CM | POA: Diagnosis not present

## 2019-10-18 DIAGNOSIS — R112 Nausea with vomiting, unspecified: Secondary | ICD-10-CM | POA: Diagnosis present

## 2019-10-18 DIAGNOSIS — Z7982 Long term (current) use of aspirin: Secondary | ICD-10-CM

## 2019-10-18 DIAGNOSIS — I10 Essential (primary) hypertension: Secondary | ICD-10-CM | POA: Diagnosis present

## 2019-10-18 LAB — BLOOD GAS, VENOUS
Acid-Base Excess: 5 mmol/L — ABNORMAL HIGH (ref 0.0–2.0)
Bicarbonate: 29.9 mmol/L — ABNORMAL HIGH (ref 20.0–28.0)
O2 Saturation: 77.2 %
Patient temperature: 37
pCO2, Ven: 44 mmHg (ref 44.0–60.0)
pH, Ven: 7.44 — ABNORMAL HIGH (ref 7.250–7.430)
pO2, Ven: 40 mmHg (ref 32.0–45.0)

## 2019-10-18 LAB — URINALYSIS, COMPLETE (UACMP) WITH MICROSCOPIC
Bacteria, UA: NONE SEEN
Bilirubin Urine: NEGATIVE
Glucose, UA: >=500 mg/dL — AB
Hgb urine dipstick: NEGATIVE
Ketones, ur: 20 mg/dL — AB
Leukocytes,Ua: NEGATIVE
Nitrite: NEGATIVE
Protein, ur: 100 mg/dL — AB
Specific Gravity, Urine: 1.028 (ref 1.005–1.030)
Squamous Epithelial / HPF: NONE SEEN (ref 0–5)
pH: 6 (ref 5.0–8.0)

## 2019-10-18 LAB — CBC
HCT: 45.4 % (ref 39.0–52.0)
Hemoglobin: 15.9 g/dL (ref 13.0–17.0)
MCH: 31.9 pg (ref 26.0–34.0)
MCHC: 35 g/dL (ref 30.0–36.0)
MCV: 91.2 fL (ref 80.0–100.0)
Platelets: 374 10*3/uL (ref 150–400)
RBC: 4.98 MIL/uL (ref 4.22–5.81)
RDW: 11.9 % (ref 11.5–15.5)
WBC: 18.6 10*3/uL — ABNORMAL HIGH (ref 4.0–10.5)
nRBC: 0 % (ref 0.0–0.2)

## 2019-10-18 LAB — COMPREHENSIVE METABOLIC PANEL
ALT: 16 U/L (ref 0–44)
AST: 22 U/L (ref 15–41)
Albumin: 4 g/dL (ref 3.5–5.0)
Alkaline Phosphatase: 143 U/L — ABNORMAL HIGH (ref 38–126)
Anion gap: 24 — ABNORMAL HIGH (ref 5–15)
BUN: 22 mg/dL — ABNORMAL HIGH (ref 6–20)
CO2: 24 mmol/L (ref 22–32)
Calcium: 9.5 mg/dL (ref 8.9–10.3)
Chloride: 86 mmol/L — ABNORMAL LOW (ref 98–111)
Creatinine, Ser: 1.41 mg/dL — ABNORMAL HIGH (ref 0.61–1.24)
GFR calc Af Amer: >60 mL/min (ref >60)
GFR calc non Af Amer: 55 mL/min — ABNORMAL LOW (ref >60)
Glucose, Bld: 972 mg/dL (ref 70–99)
Potassium: 4.5 mmol/L (ref 3.5–5.1)
Sodium: 134 mmol/L — ABNORMAL LOW (ref 135–145)
Total Bilirubin: 1.5 mg/dL — ABNORMAL HIGH (ref 0.3–1.2)
Total Protein: 8.3 g/dL — ABNORMAL HIGH (ref 6.5–8.1)

## 2019-10-18 LAB — RESPIRATORY PANEL BY RT PCR (FLU A&B, COVID)
Influenza A by PCR: NEGATIVE
Influenza B by PCR: NEGATIVE
SARS Coronavirus 2 by RT PCR: NEGATIVE

## 2019-10-18 LAB — GLUCOSE, CAPILLARY
Glucose-Capillary: 493 mg/dL — ABNORMAL HIGH (ref 70–99)
Glucose-Capillary: 500 mg/dL — ABNORMAL HIGH (ref 70–99)
Glucose-Capillary: >600 mg/dL (ref 70–99)
Glucose-Capillary: >600 mg/dL (ref 70–99)
Glucose-Capillary: >600 mg/dL (ref 70–99)

## 2019-10-18 LAB — LIPASE, BLOOD: Lipase: 18 U/L (ref 11–51)

## 2019-10-18 MED ORDER — DEXTROSE-NACL 5-0.45 % IV SOLN: INTRAVENOUS | Status: DC

## 2019-10-18 MED ORDER — DEXTROSE 50 % IV SOLN: 0.0000 mL | INTRAVENOUS | Status: DC | PRN

## 2019-10-18 MED ORDER — HYDROMORPHONE HCL 1 MG/ML IJ SOLN
1.0000 mg | Freq: Once | INTRAMUSCULAR | Status: AC
  Administered 2019-10-18: 1 mg via INTRAVENOUS
  Filled 2019-10-18: qty 1

## 2019-10-18 MED ORDER — INSULIN REGULAR(HUMAN) IN NACL 100-0.9 UT/100ML-% IV SOLN
INTRAVENOUS | Status: DC
  Administered 2019-10-18 – 2019-10-19 (×2): 15 [IU]/h via INTRAVENOUS
  Filled 2019-10-18 (×2): qty 100

## 2019-10-18 MED ORDER — SODIUM CHLORIDE 0.9 % IV BOLUS
1000.0000 mL | INTRAVENOUS | Status: AC
  Administered 2019-10-18: 19:00:00 1000 mL via INTRAVENOUS

## 2019-10-18 MED ORDER — ONDANSETRON HCL 4 MG/2ML IJ SOLN
4.0000 mg | Freq: Once | INTRAMUSCULAR | Status: AC | PRN
  Administered 2019-10-18: 17:00:00 4 mg via INTRAVENOUS
  Filled 2019-10-18: qty 2

## 2019-10-18 MED ORDER — POTASSIUM CHLORIDE 10 MEQ/100ML IV SOLN
10.0000 meq | INTRAVENOUS | Status: AC
  Administered 2019-10-18 (×2): 10 meq via INTRAVENOUS
  Filled 2019-10-18 (×2): qty 100

## 2019-10-18 MED ORDER — SODIUM CHLORIDE 0.9 % IV SOLN: INTRAVENOUS | Status: DC

## 2019-10-18 MED ORDER — ONDANSETRON HCL 4 MG/2ML IJ SOLN
4.0000 mg | Freq: Once | INTRAMUSCULAR | Status: AC
  Administered 2019-10-18: 4 mg via INTRAVENOUS
  Filled 2019-10-18: qty 2

## 2019-10-18 MED ORDER — TAMSULOSIN HCL 0.4 MG PO CAPS
0.4000 mg | ORAL_CAPSULE | Freq: Once | ORAL | Status: AC
  Administered 2019-10-18: 20:00:00 0.4 mg via ORAL
  Filled 2019-10-18: qty 1

## 2019-10-18 MED ORDER — MORPHINE SULFATE (PF) 2 MG/ML IV SOLN
2.0000 mg | INTRAVENOUS | Status: DC | PRN
  Administered 2019-10-19: 04:00:00 2 mg via INTRAVENOUS
  Filled 2019-10-18: qty 1

## 2019-10-18 MED ORDER — ONDANSETRON HCL 4 MG/2ML IJ SOLN
4.0000 mg | Freq: Four times a day (QID) | INTRAMUSCULAR | Status: DC | PRN
  Administered 2019-10-19 – 2019-10-20 (×4): 4 mg via INTRAVENOUS
  Filled 2019-10-18 (×4): qty 2

## 2019-10-18 MED ORDER — HYDROMORPHONE HCL 1 MG/ML IJ SOLN
0.5000 mg | Freq: Once | INTRAMUSCULAR | Status: AC
  Administered 2019-10-18: 0.5 mg via INTRAVENOUS
  Filled 2019-10-18: qty 1

## 2019-10-18 NOTE — ED Provider Notes (Signed)
Samuel [name]  ____________________________________________   First MD Initiated Contact with Patient 10/18/19 1816     (approximate)  I have reviewed the triage vital signs and the nursing notes.   HISTORY  Chief Complaint Emesis    HPI Samuel [name] is a 59 y.o. male with history of diabetes, recurrent UTIs, here with abdominal pain, nausea, vomiting.  The patient states that over the last week or so, he has had progressive worsening epigastric and right flank pain.  He said associated nausea, vomiting, and difficulty tolerating p.o.  He states he has a history of recurrent kidney stones with similar symptoms.  He also has had to cut back on his insulin because he is from [place] and does not have anyone to prescribe it.  He states his blood sugars been increasingly elevated.  He has been persistently and uncontrollably vomiting, along with nausea.  He has had no fevers or chills.  No dysuria.  No other acute medical complaints.  Of [name], he has required multiple stents in the past on the right.  Denies any known fevers.  No cough.        Past Medical History:  Diagnosis Date  . Diabetes mellitus without complication ([place])   . Hypertension   . UTI (urinary tract infection)     Patient Active Problem List   Diagnosis Date Noted  . Hyperglycemic crisis in diabetes mellitus ([place]) 10/18/2019    Past Surgical History:  Procedure Laterality Date  . CORONARY ANGIOPLASTY WITH STENT PLACEMENT    . FOOT AMPUTATION    . HERNIA REPAIR    . INCISION AND DRAINAGE PERIRECTAL ABSCESS N/A 09/20/2019   Procedure: IRRIGATION AND DEBRIDEMENT PERIRECTAL ABSCESS;  Surgeon: Herbert Pun, MD;  Location: [facility] ORS;  Service: General;  Laterality: N/A;    Prior to Admission medications   Medication Sig Start Date End Date Taking? Authorizing Provider  amLODipine (NORVASC) 10 MG tablet Take 10 mg by mouth daily.   Yes [provider]  aspirin EC 81 MG tablet Take 81 mg by mouth daily.   Yes [provider]  clopidogrel (PLAVIX) 75 MG tablet Take 1 tablet (75 mg total) by mouth daily. 07/16/19  Yes Triplett, Cari B, FNP  famotidine (PEPCID) 20 MG tablet Take 1 tablet (20 mg total) by mouth daily. 07/16/19 07/15/20 Yes Triplett, Cari B, FNP  gabapentin (NEURONTIN) 300 MG capsule Take 300 mg by mouth 2 (two) times daily.   Yes [provider]  hydrochlorothiazide (HYDRODIURIL) 25 MG tablet Take 25 mg by mouth daily.   Yes [provider]  insulin detemir (LEVEMIR) 100 UNIT/ML injection Inject 25 Units into the skin 2 (two) times daily.    Yes [provider]    Allergies Patient has no known allergies.  No family history on file.  Social History Social History   Tobacco Use  . Smoking status: Former Research scientist (life sciences)  . Smokeless tobacco: Never Used  Substance Use Topics  . Alcohol use: Not Currently  . Drug use: Not on file    Review of Systems  Review of Systems  Constitutional: Positive for fatigue. Negative for chills and fever.  HENT: Negative for sore throat.   Respiratory: Negative for shortness of breath.   Cardiovascular: Negative for chest pain.  Gastrointestinal: Positive for abdominal pain and nausea.  Genitourinary: Positive for flank pain.  Musculoskeletal: Negative for neck pain.  Skin: Negative for rash and wound.  Allergic/Immunologic: Negative for immunocompromised  state.  Neurological: Positive for weakness. Negative for numbness.  Hematological: Does not bruise/bleed easily.  All other systems reviewed and are negative.    ____________________________________________  PHYSICAL EXAM:      VITAL SIGNS: ED Triage Vitals  Enc Vitals Group     BP 10/18/19 1646 (!) 146/82     Pulse Rate 10/18/19 1646 (!) 124     Resp 10/18/19 1646 18     Temp 10/18/19 1646 (!) 97.1 F (36.2 C)     Temp Source 10/18/19 1646 Axillary     SpO2 10/18/19 1646 96 %      Weight 10/18/19 1652 210 lb (95.3 kg)     Height 10/18/19 1652 5\' 9"  (1.753 m)     Head Circumference --      Peak Flow --      Pain Score 10/18/19 1651 10     Pain Loc --      Pain Edu? --      Excl. in GC? --      Physical Exam Vitals and nursing [name] reviewed.  Constitutional:      General: He is not in acute distress.    Appearance: He is well-developed.  HENT:     Head: Normocephalic and atraumatic.     Mouth/Throat:     Mouth: Mucous membranes are dry.  Eyes:     Conjunctiva/sclera: Conjunctivae normal.  Cardiovascular:     Rate and Rhythm: Regular rhythm. Tachycardia present.     Heart sounds: Normal heart sounds. No murmur. No friction rub.  Pulmonary:     Effort: Pulmonary effort is normal. No respiratory distress.     Breath sounds: Normal breath sounds. No wheezing or rales.  Abdominal:     General: Abdomen is flat. There is no distension.     Palpations: Abdomen is soft.     Tenderness: There is no abdominal tenderness (R flank).  Musculoskeletal:     Cervical back: Neck supple.  Skin:    General: Skin is warm.     Capillary Refill: Capillary refill takes less than 2 seconds.  Neurological:     Mental Status: He is alert and oriented to person, place, and time.     Motor: No abnormal muscle tone.       ____________________________________________   LABS (all labs ordered are listed, but only abnormal results are displayed)  Labs Reviewed  COMPREHENSIVE METABOLIC PANEL - Abnormal; Notable for the following components:      Result Value   Sodium 134 (*)    Chloride 86 (*)    Glucose, Bld 972 (*)    BUN 22 (*)    Creatinine, Ser 1.41 (*)    Total Protein 8.3 (*)    Alkaline Phosphatase 143 (*)    Total Bilirubin 1.5 (*)    GFR calc non Af Amer 55 (*)    Anion gap 24 (*)    All other components within normal limits  CBC - Abnormal; Notable for the following components:   WBC 18.6 (*)    All other components within normal limits  URINALYSIS,  COMPLETE (UACMP) WITH MICROSCOPIC - Abnormal; Notable for the following components:   Color, Urine STRAW (*)    APPearance CLEAR (*)    Glucose, UA >=500 (*)    Ketones, ur 20 (*)    Protein, ur 100 (*)    All other components within normal limits  BLOOD GAS, VENOUS - Abnormal; Notable for the following components:   pH, Ven  7.44 (*)    Bicarbonate 29.9 (*)    Acid-Base Excess 5.0 (*)    All other components within normal limits  GLUCOSE, CAPILLARY - Abnormal; Notable for the following components:   Glucose-Capillary >600 (*)    All other components within normal limits  GLUCOSE, CAPILLARY - Abnormal; Notable for the following components:   Glucose-Capillary >600 (*)    All other components within normal limits  GLUCOSE, CAPILLARY - Abnormal; Notable for the following components:   Glucose-Capillary >600 (*)    All other components within normal limits  GLUCOSE, CAPILLARY - Abnormal; Notable for the following components:   Glucose-Capillary 493 (*)    All other components within normal limits  GLUCOSE, CAPILLARY - Abnormal; Notable for the following components:   Glucose-Capillary 500 (*)    All other components within normal limits  GLUCOSE, CAPILLARY - Abnormal; Notable for the following components:   Glucose-Capillary 384 (*)    All other components within normal limits  RESPIRATORY PANEL BY RT PCR (FLU A&B, COVID)  LIPASE, BLOOD  BASIC METABOLIC PANEL  OSMOLALITY  LACTIC ACID, PLASMA  LACTIC ACID, PLASMA    ____________________________________________  EKG: Sinus tachycardia, ventricular rate 121.  PR 144, QRS 80, QTc 479.  No acute ST elevations or depressions. ________________________________________  RADIOLOGY All imaging, including plain films, CT scans, and ultrasounds, independently reviewed by me, and interpretations confirmed via formal radiology reads.  ED MD interpretation:   CT renal stone: 5 mm stone on the right, mild hydro-  Official radiology  report(s): CT Renal Stone Study  Result Date: 10/18/2019 CLINICAL DATA:  59 year old male with flank pain. Concern for kidney stone. EXAM: CT ABDOMEN AND PELVIS WITHOUT CONTRAST TECHNIQUE: Multidetector CT imaging of the abdomen and pelvis was performed following the standard protocol without IV contrast. COMPARISON:  CT abdomen pelvis dated 10/01/2013. FINDINGS: Evaluation of this exam is limited in the absence of intravenous contrast. Lower chest: The visualized lung bases are clear. No intra-abdominal free air or free fluid. Hepatobiliary: The liver is unremarkable. No intrahepatic biliary ductal dilatation. The gallbladder is unremarkable. Pancreas: Unremarkable. No pancreatic ductal dilatation or surrounding inflammatory changes. Spleen: Normal in size without focal abnormality. Adrenals/Urinary Tract: The adrenal glands are unremarkable. There is a 3 cm left renal inferior pole cyst. There is a 5 mm stone in the distal right ureter. There is minimal right hydronephrosis. No stone identified within the right kidney. There is no hydronephrosis or nephrolithiasis on the left. The left ureter and urinary bladder appear unremarkable. Stomach/Bowel: There is minimal circumferential thickening of the distal esophagus which may be partly related to underdistention or represent mild esophagitis. Clinical correlation is recommended. There is moderate stool throughout the colon. There is no bowel obstruction or active inflammation. Normal appendix. Vascular/Lymphatic: Minimal aortoiliac atherosclerotic disease. The IVC is unremarkable. No portal venous gas. There is no adenopathy. Reproductive: The prostate and seminal vesicles are grossly unremarkable. No pelvic mass. Scattered calcification of the peripheral corpus cavernosum of the penis noted, progressed since the prior CT. Other: None Musculoskeletal: Osteopenia with degenerative changes of the spine. No acute osseous pathology. IMPRESSION: 1. A 5 mm distal right  ureteral stone with minimal right hydronephrosis. 2. No bowel obstruction. Normal appendix. 3. Mild circumferential thickening of the distal esophagus may be related to underdistention or represent mild esophagitis. Clinical correlation is recommended. 4. Aortic Atherosclerosis (ICD10-I70.0). Electronically Signed   By: Elgie Collard M.D.   On: 10/18/2019 19:51    ____________________________________________  PROCEDURES  Procedure(s) performed (including Critical Care):  .Critical Care Performed by: Samuel Pollack, MD Authorized by: Samuel Pollack, MD   Critical care provider statement:    Critical care time (minutes):  35   Critical care time was exclusive of:  Separately billable procedures and treating other patients and teaching time   Critical care was necessary to treat or prevent imminent or life-threatening deterioration of the following conditions:  Cardiac failure, respiratory failure, circulatory failure and metabolic crisis   Critical care was time spent personally by me on the following activities:  Development of treatment plan with patient or surrogate, discussions with consultants, evaluation of patient's response to treatment, examination of patient, obtaining history from patient or surrogate, ordering and performing treatments and interventions, ordering and review of laboratory studies, ordering and review of radiographic studies, pulse oximetry, re-evaluation of patient's condition and review of old charts   I assumed direction of critical care for this patient from another provider in my specialty: no   .1-3 Lead EKG Interpretation Performed by: Samuel Pollack, MD Authorized by: Samuel Pollack, MD     Interpretation: non-specific     ECG rate:  90-120   ECG rate assessment: tachycardic     Rhythm: sinus tachycardia     Ectopy: PVCs     Conduction: normal   Comments:     Indication: Tachycardia,  DKA    ____________________________________________  INITIAL IMPRESSION / MDM / ASSESSMENT AND PLAN / ED COURSE  As part of my medical decision making, I reviewed the following data within the electronic MEDICAL RECORD NUMBER Nursing notes reviewed and incorporated, Old chart reviewed, Notes from prior ED visits, and [place] Controlled Substance Database       *Kmari Halter was evaluated in Emergency Department on 10/19/2019 for the symptoms described in the history of present illness. He was evaluated in the context of the global COVID-19 pandemic, which necessitated consideration that the patient might be at risk for infection with the SARS-CoV-2 virus that causes COVID-19. Institutional protocols and algorithms that pertain to the evaluation of patients at risk for COVID-19 are in a state of rapid change based on information released by regulatory bodies including the CDC and federal and state organizations. These policies and algorithms were followed during the patient's care in the ED.  Some ED evaluations and interventions may be delayed as a result of limited staffing during the pandemic.*     Medical Decision Making:  59 yo M here with nausea, vomiting, flank pain. Suspect early DKA vs HHS secondary to combination of medication nonadherence as well as concomitant right ureterolithiasis. On arrival, pt dehydrated, vomiting in mild distress. IVF, insulin gtt started along with analgesia and antiemetics. CT stone study shows 5 mm R ureteral stone with mild hydro, which is also likely contributing to his nausea. He has no pyuria, no fever, no signs to suggest infection. Will treat with fluids, flomax. No emergent indication for urological intervention. Admit to medicine.  ____________________________________________  FINAL CLINICAL IMPRESSION(S) / ED DIAGNOSES  Final diagnoses:  Diabetic ketoacidosis without coma associated with type 2 diabetes mellitus ([facility]CC)  Ureteral stone     MEDICATIONS  GIVEN DURING THIS VISIT:  Medications  insulin regular, human (MYXREDLIN) 100 units/ 100 mL infusion (15 Units/hr Intravenous Rate/Dose Change 10/18/19 2342)  0.9 %  sodium chloride infusion ( Intravenous New Bag/Given 10/18/19 2017)  dextrose 5 %-0.45 % sodium chloride infusion ( Intravenous New Bag/Given 10/18/19 2017)  dextrose 50 % solution 0-50 mL (has no administration in  time range)  sodium chloride 0.9 % bolus 1,000 mL (0 mLs Intravenous Stopped 10/18/19 2028)  ondansetron (ZOFRAN) injection 4 mg (has no administration in time range)  morphine 2 MG/ML injection 2 mg (has no administration in time range)  0.9 %  sodium chloride infusion ( Intravenous Not Given 10/18/19 2311)  ondansetron (ZOFRAN) injection 4 mg (4 mg Intravenous Given 10/18/19 1712)  potassium chloride 10 mEq in 100 mL IVPB (0 mEq Intravenous Stopped 10/18/19 2147)  HYDROmorphone (DILAUDID) injection 1 mg (1 mg Intravenous Given 10/18/19 1922)  ondansetron (ZOFRAN) injection 4 mg (4 mg Intravenous Given 10/18/19 1922)  tamsulosin (FLOMAX) capsule 0.4 mg (0.4 mg Oral Given 10/18/19 2018)  HYDROmorphone (DILAUDID) injection 1 mg (1 mg Intravenous Given 10/18/19 2029)  HYDROmorphone (DILAUDID) injection 0.5 mg (0.5 mg Intravenous Given 10/18/19 2228)     ED Discharge Orders    None       [name]:  This document was prepared using Dragon voice recognition software and may include unintentional dictation errors.   Samuel Pollack, MD 10/19/19 [id number]

## 2019-10-18 NOTE — H&P (Signed)
Chief Complaint: Patient presents with tractable nausea vomiting since 1 AM last night. HPI: Samuel [name] is an 59 y.o. male with known history of coronary disease status post stent x1 in 2019, diabetes mellitus type 2 on insulin therapy complicated by DM neuropathy, chronic essential hypertension and history of right-sided nephrolithiasis status post stent in the past.  Patient is a resident of [place], and relocated to [place] area.  He presents on account of intractable nausea and vomiting, onset of symptoms since 1 AM last night.  Symptoms have been associated with the right flank pain with no specific radiation.  No known aggravating or relieving factors.  On presentation, patient was found to have markedly elevated serum blood glucose of 900. Patient admitted he has been noncompliant with his regular dosing of Levemir of 25 units twice daily.  He is currently on 25 units daily.  Imaging studies also did reveal evidence of right-sided nephrolithiasis with a 9mm distal ureteral stone.  Patient was referred to hospitalist service for admission and further management.  Past Medical History:  Diagnosis Date  . Diabetes mellitus without complication (HCC)   . Hypertension   . UTI (urinary tract infection)     Past Surgical History:  Procedure Laterality Date  . CORONARY ANGIOPLASTY WITH STENT PLACEMENT    . FOOT AMPUTATION    . HERNIA REPAIR    . INCISION AND DRAINAGE PERIRECTAL ABSCESS N/A 09/20/2019   Procedure: IRRIGATION AND DEBRIDEMENT PERIRECTAL ABSCESS;  Surgeon: Carolan Shiver, MD;  Location: [facility] ORS;  Service: General;  Laterality: N/A;    No family history on file. Social History:  reports that he has quit smoking. He has never used smokeless tobacco. He reports previous alcohol use. No history on file for drug.  Allergies: No Known Allergies  (Not in a hospital admission)   Results for orders placed or performed during the hospital encounter of 10/18/19 (from  the past 48 hour(s))  Lipase, blood     Status: None   Collection Time: 10/18/19  5:07 PM  Result Value Ref Range   Lipase 18 11 - 51 U/L    Comment: Performed at [facility], [address] [place], [place] [address]  Comprehensive metabolic panel     Status: Abnormal   Collection Time: 10/18/19  5:07 PM  Result Value Ref Range   Sodium 134 (L) 135 - 145 mmol/L    Comment: ELECTROLYTES REPEATED.PMF   Potassium 4.5 3.5 - 5.1 mmol/L    Comment: HEMOLYSIS AT THIS LEVEL MAY AFFECT RESULT   Chloride 86 (L) 98 - 111 mmol/L   CO2 24 22 - 32 mmol/L   Glucose, Bld 972 (HH) 70 - 99 mg/dL    Comment: CRITICAL RESULT CALLED TO, READ BACK BY AND VERIFIED WITH BRANDY DAVIS AT 3614 10/18/19.PMF Glucose reference range applies only to samples taken after fasting for at least 8 hours.    BUN 22 (H) 6 - 20 mg/dL   Creatinine, Ser 4.31 (H) 0.61 - 1.24 mg/dL   Calcium 9.5 8.9 - 54.0 mg/dL   Total Protein 8.3 (H) 6.5 - 8.1 g/dL   Albumin 4.0 3.5 - 5.0 g/dL   AST 22 15 - 41 U/L   ALT 16 0 - 44 U/L   Alkaline Phosphatase 143 (H) 38 - 126 U/L   Total Bilirubin 1.5 (H) 0.3 - 1.2 mg/dL   GFR calc non Af Amer 55 (L) >60 mL/min   GFR calc Af Amer >60 >60 mL/min  Anion gap 24 (H) 5 - 15    Comment: Performed at [facility], [address] [place], [place] [address]  CBC     Status: Abnormal   Collection Time: 10/18/19  5:07 PM  Result Value Ref Range   WBC 18.6 (H) 4.0 - 10.5 K/uL   RBC 4.98 4.22 - 5.81 MIL/uL   Hemoglobin 15.9 13.0 - 17.0 g/dL   HCT 82.945.4 56.239.0 - 13.052.0 %   MCV 91.2 80.0 - 100.0 fL   MCH 31.9 26.0 - 34.0 pg   MCHC 35.0 30.0 - 36.0 g/dL   RDW 86.511.9 78.411.5 - 69.615.5 %   Platelets 374 150 - 400 K/uL   nRBC 0.0 0.0 - 0.2 %    Comment: Performed at [facility], [address] [place], [place] [address]  Urinalysis, Complete w Microscopic     Status: Abnormal   Collection Time: 10/18/19  6:31 PM  Result Value Ref Range   Color, Urine STRAW (A) YELLOW    APPearance CLEAR (A) CLEAR   Specific Gravity, Urine 1.028 1.005 - 1.030   pH 6.0 5.0 - 8.0   Glucose, UA >=500 (A) NEGATIVE mg/dL   Hgb urine dipstick NEGATIVE NEGATIVE   Bilirubin Urine NEGATIVE NEGATIVE   Ketones, ur 20 (A) NEGATIVE mg/dL   Protein, ur 413100 (A) NEGATIVE mg/dL   Nitrite NEGATIVE NEGATIVE   Leukocytes,Ua NEGATIVE NEGATIVE   RBC / HPF 0-5 0 - 5 RBC/hpf   WBC, UA 0-5 0 - 5 WBC/hpf   Bacteria, UA NONE SEEN NONE SEEN   Squamous Epithelial / LPF NONE SEEN 0 - 5    Comment: Performed at [facility], [address] [place], [place] [address]  Respiratory Panel by RT PCR (Flu A&B, Covid) - Nasopharyngeal Swab     Status: None   Collection Time: 10/18/19  6:31 PM   Specimen: Nasopharyngeal Swab  Result Value Ref Range   SARS Coronavirus 2 by RT PCR NEGATIVE NEGATIVE    Comment: (NOTE) SARS-CoV-2 target nucleic acids are NOT DETECTED. The SARS-CoV-2 RNA is generally detectable in upper respiratoy specimens during the acute phase of infection. The lowest concentration of SARS-CoV-2 viral copies this assay can detect is 131 copies/mL. A negative result does not preclude SARS-Cov-2 infection and should not be used as the sole basis for treatment or other patient management decisions. A negative result may occur with  improper specimen collection/handling, submission of specimen other than nasopharyngeal swab, presence of viral mutation(s) within the areas targeted by this assay, and inadequate number of viral copies (<131 copies/mL). A negative result must be combined with clinical observations, patient history, and epidemiological information. The expected result is Negative. Fact Sheet for Patients:  https://www.moore.com/https://www.fda.gov/media/142436/download Fact Sheet for Healthcare Providers:  https://www.young.biz/https://www.fda.gov/media/142435/download This test is not yet ap proved or cleared by the [place] FDA and  has been authorized for detection and/or diagnosis of SARS-CoV-2 by  FDA under an Emergency Use Authorization (EUA). This EUA will remain  in effect (meaning this test can be used) for the duration of the COVID-19 declaration under Section 564(b)(1) of the Act, 21 U.S.C. section 360bbb-3(b)(1), unless the authorization is terminated or revoked sooner.    Influenza A by PCR NEGATIVE NEGATIVE   Influenza B by PCR NEGATIVE NEGATIVE    Comment: (NOTE) The Xpert Xpress SARS-CoV-2/FLU/RSV assay is intended as an aid in  the diagnosis of influenza from Nasopharyngeal swab specimens and  should not be used as a sole basis  for treatment. Nasal washings and  aspirates are unacceptable for Xpert Xpress SARS-CoV-2/FLU/RSV  testing. Fact Sheet for Patients: https://www.moore.com/ Fact Sheet for Healthcare Providers: https://www.young.biz/ This test is not yet approved or cleared by the [place] FDA and  has been authorized for detection and/or diagnosis of SARS-CoV-2 by  FDA under an Emergency Use Authorization (EUA). This EUA will remain  in effect (meaning this test can be used) for the duration of the  Covid-19 declaration under Section 564(b)(1) of the Act, 21  U.S.C. section 360bbb-3(b)(1), unless the authorization is  terminated or revoked. Performed at [facility], [address] [place], [place] [address]   Blood gas, venous     Status: Abnormal   Collection Time: 10/18/19  6:33 PM  Result Value Ref Range   pH, Ven 7.44 (H) 7.250 - 7.430   pCO2, Ven 44 44.0 - 60.0 mmHg   pO2, Ven 40.0 32.0 - 45.0 mmHg   Bicarbonate 29.9 (H) 20.0 - 28.0 mmol/L   Acid-Base Excess 5.0 (H) 0.0 - 2.0 mmol/L   O2 Saturation 77.2 %   Patient temperature 37.0    Collection site VEIN    Sample type VENOUS     Comment: Performed at [facility], [address] [place], [place] [address]  Glucose, capillary     Status: Abnormal   Collection Time: 10/18/19  7:14 PM  Result Value Ref Range   Glucose-Capillary  >600 (HH) 70 - 99 mg/dL    Comment: Glucose reference range applies only to samples taken after fasting for at least 8 hours.  Glucose, capillary     Status: Abnormal   Collection Time: 10/18/19  8:34 PM  Result Value Ref Range   Glucose-Capillary >600 (HH) 70 - 99 mg/dL    Comment: Glucose reference range applies only to samples taken after fasting for at least 8 hours.  Glucose, capillary     Status: Abnormal   Collection Time: 10/18/19  9:46 PM  Result Value Ref Range   Glucose-Capillary >600 (HH) 70 - 99 mg/dL    Comment: Glucose reference range applies only to samples taken after fasting for at least 8 hours.   Comment 1 Document in Chart   Glucose, capillary     Status: Abnormal   Collection Time: 10/18/19 10:31 PM  Result Value Ref Range   Glucose-Capillary 493 (H) 70 - 99 mg/dL    Comment: Glucose reference range applies only to samples taken after fasting for at least 8 hours.   CT Renal Stone Study  Result Date: 10/18/2019 CLINICAL DATA:  59 year old male with flank pain. Concern for kidney stone. EXAM: CT ABDOMEN AND PELVIS WITHOUT CONTRAST TECHNIQUE: Multidetector CT imaging of the abdomen and pelvis was performed following the standard protocol without IV contrast. COMPARISON:  CT abdomen pelvis dated 10/01/2013. FINDINGS: Evaluation of this exam is limited in the absence of intravenous contrast. Lower chest: The visualized lung bases are clear. No intra-abdominal free air or free fluid. Hepatobiliary: The liver is unremarkable. No intrahepatic biliary ductal dilatation. The gallbladder is unremarkable. Pancreas: Unremarkable. No pancreatic ductal dilatation or surrounding inflammatory changes. Spleen: Normal in size without focal abnormality. Adrenals/Urinary Tract: The adrenal glands are unremarkable. There is a 3 cm left renal inferior pole cyst. There is a 5 mm stone in the distal right ureter. There is minimal right hydronephrosis. No stone identified within the right kidney.  There is no hydronephrosis or nephrolithiasis on the left. The left ureter and urinary bladder appear unremarkable.  Stomach/Bowel: There is minimal circumferential thickening of the distal esophagus which may be partly related to underdistention or represent mild esophagitis. Clinical correlation is recommended. There is moderate stool throughout the colon. There is no bowel obstruction or active inflammation. Normal appendix. Vascular/Lymphatic: Minimal aortoiliac atherosclerotic disease. The IVC is unremarkable. No portal venous gas. There is no adenopathy. Reproductive: The prostate and seminal vesicles are grossly unremarkable. No pelvic mass. Scattered calcification of the peripheral corpus cavernosum of the penis noted, progressed since the prior CT. Other: None Musculoskeletal: Osteopenia with degenerative changes of the spine. No acute osseous pathology. IMPRESSION: 1. A 5 mm distal right ureteral stone with minimal right hydronephrosis. 2. No bowel obstruction. Normal appendix. 3. Mild circumferential thickening of the distal esophagus may be related to underdistention or represent mild esophagitis. Clinical correlation is recommended. 4. Aortic Atherosclerosis (ICD10-I70.0). Electronically Signed   By: Elgie Collard M.D.   On: 10/18/2019 19:51    Review of Systems  Constitutional: Positive for appetite change.  HENT: Negative.   Eyes: Negative.   Gastrointestinal: Positive for nausea and vomiting.  Endocrine: Negative for polydipsia and polyphagia.  Genitourinary: Positive for flank pain and frequency.  Psychiatric/Behavioral: Negative.     Blood pressure (!) 167/100, pulse (!) 116, temperature (!) 97.1 F (36.2 C), temperature source Axillary, resp. rate 17, height 5\' 9"  (1.753 m), weight 95.3 kg, SpO2 96 %. Physical Exam  Constitutional: He is oriented to person, place, and time. He appears well-developed and well-nourished.  HENT:  Head: Normocephalic and atraumatic.  Eyes: Pupils  are equal, round, and reactive to light.  Cardiovascular: Normal rate and regular rhythm.  Respiratory: Effort normal and breath sounds normal.  GI: Bowel sounds are normal. There is abdominal tenderness in the right lower quadrant. There is guarding.    Musculoskeletal:        General: Deformity present. Normal range of motion.     Cervical back: Normal range of motion and neck supple.       Feet:     Comments: S/P right TMA  Neurological: He is alert and oriented to person, place, and time. No cranial nerve deficit or sensory deficit. GCS eye subscore is 4. GCS verbal subscore is 5. GCS motor subscore is 6.  Psychiatric: His speech is normal and behavior is normal. His mood appears anxious.     Assessment/Plan  #1.  Hyperglycemic crisis in the context of noncompliance in a patient with history of type 2 diabetes mellitus.  No evidence of DKA.  Serum osmolality pending to rule out HHS.  Patient was initiated on insulin gtt in the ED.  We will optimize blood glucose control and transition to long-acting insulin at recommended dose of 25 units twice daily.  #2.  Distal right ureteral kidney stone with pain.  No evidence of pyelonephritis.  We will optimize pain management with morphine as tolerated by patient.  IV fluids and Flomax has been initiated.  We will strain urine as appropriate.  Urology was consulted in the ED and will follow up.  Conservative management recommended.  Stone will likely fall out.  #3.  Hypertensive crisis, present on admission.  Likely due to noncompliance with medication.  We will optimize with IV hydralazine.  Resume home medications except for hydrochlorothiazide.  Optimize BP as tolerated.  #4.  Acute kidney injury, present admission likely prerenal etiology secondary to dehydration from hyperglycemia crisis.  #5.  Pseudohyponatremia likely due to severe hyperglycemic crisis.  Monitor Chem-7 daily.  #6.  Intractable  nausea and vomiting: Likely related to  diabetic gastroparesis versus nephrolithiasis.  Optimize with Zofran as needed.  We will keep patient n.p.o. except for sips of ice and water.  Advance diet as tolerated by patient.  #7.  DVT prophylaxis with Lovenox: Renally dosed.  Caution with hematuria.  #8.  History of transmetatarsal amputation.  Podiatry follow-up as outpatient.  Wound care eval prior to discharge.  #9.  History of coronary disease status post PCI and stent.  Asymptomatic at this time. Artist Beach, MD 10/18/2019, 10:54 PM

## 2019-10-18 NOTE — ED Notes (Signed)
Pt provided low carb snack at this time.

## 2019-10-18 NOTE — ED Notes (Signed)
Pt did not provide UA sample, states he did not know one was needed. Pt informed of urinal and use for UA. Cardiac leads placed back on pt. Blanket adjusted per pt request.

## 2019-10-18 NOTE — ED Notes (Signed)
Pt educated on monitoring cords and purpose of vital assessment. Cardiac leads replaced. Pt informed of plan of care and hourly BGL checks for insulin drip. Pt verbalizes understanding.

## 2019-10-18 NOTE — ED Triage Notes (Signed)
Per patient's report, patient has been vomiting since 0100 am last night. Patient denies having diarrhea. Patient is able to sip water, but still feels nauseous. Patient states he had two pieces of orange and 3 grapes today and couldn't eat anymore. Patient state he was not able to take any of his meds today due to the nausea, including his insulin.

## 2019-10-18 NOTE — ED Notes (Signed)
Admitting provider at bedside speaking with pt about plan of care at this time.

## 2019-10-18 NOTE — ED Notes (Signed)
Pt taken to CT.

## 2019-10-18 NOTE — ED Notes (Addendum)
Pt c/o n/v that's been going on for last week along with right flank pain from kidney stone. Pt states he's in a lot of pain and wants right kidney looked at. See also triage note.

## 2019-10-18 NOTE — ED Notes (Signed)
Pt provided urinal and instructed on use for UA sample. Pt verbalizes understanding. Cardiac leads placed back on pt.

## 2019-10-18 NOTE — ED Notes (Signed)
Pt pulled off all monitoring equipment and pulled cords and call bell out of monitor and took off gown partially, states he needed to poop. Pt educated on equipment and cautioned against damaging hospital equipment and risk of pulling out IV. Pt states he was waiting for 20 minutes for help. Pt informed that there are other patients in section that also require attending to. Pt apologized and verbalizes understanding. Cardiac leads left off at this time per pt request.

## 2019-10-18 NOTE — ED Notes (Signed)
Pt provided additional blanket and ice chips per his request. Pt provided clean emesis bag and urinal emptied.

## 2019-10-19 ENCOUNTER — Inpatient Hospital Stay: Payer: Medicaid Other

## 2019-10-19 ENCOUNTER — Encounter: Payer: Self-pay | Admitting: Internal Medicine

## 2019-10-19 DIAGNOSIS — N201 Calculus of ureter: Secondary | ICD-10-CM

## 2019-10-19 LAB — BASIC METABOLIC PANEL
Anion gap: 10 (ref 5–15)
BUN: 24 mg/dL — ABNORMAL HIGH (ref 6–20)
CO2: 29 mmol/L (ref 22–32)
Calcium: 8.5 mg/dL — ABNORMAL LOW (ref 8.9–10.3)
Chloride: 98 mmol/L (ref 98–111)
Creatinine, Ser: 1.23 mg/dL (ref 0.61–1.24)
GFR calc Af Amer: >60 mL/min (ref >60)
GFR calc non Af Amer: >60 mL/min (ref >60)
Glucose, Bld: 190 mg/dL — ABNORMAL HIGH (ref 70–99)
Potassium: 3.3 mmol/L — ABNORMAL LOW (ref 3.5–5.1)
Sodium: 137 mmol/L (ref 135–145)

## 2019-10-19 LAB — LACTIC ACID, PLASMA
Lactic Acid, Venous: 5.7 mmol/L (ref 0.5–1.9)
Lactic Acid, Venous: 4.6 mmol/L (ref 0.5–1.9)

## 2019-10-19 LAB — GLUCOSE, CAPILLARY
Glucose-Capillary: 107 mg/dL — ABNORMAL HIGH (ref 70–99)
Glucose-Capillary: 117 mg/dL — ABNORMAL HIGH (ref 70–99)
Glucose-Capillary: 144 mg/dL — ABNORMAL HIGH (ref 70–99)
Glucose-Capillary: 154 mg/dL — ABNORMAL HIGH (ref 70–99)
Glucose-Capillary: 177 mg/dL — ABNORMAL HIGH (ref 70–99)
Glucose-Capillary: 179 mg/dL — ABNORMAL HIGH (ref 70–99)
Glucose-Capillary: 180 mg/dL — ABNORMAL HIGH (ref 70–99)
Glucose-Capillary: 182 mg/dL — ABNORMAL HIGH (ref 70–99)
Glucose-Capillary: 317 mg/dL — ABNORMAL HIGH (ref 70–99)
Glucose-Capillary: 381 mg/dL — ABNORMAL HIGH (ref 70–99)
Glucose-Capillary: 384 mg/dL — ABNORMAL HIGH (ref 70–99)
Glucose-Capillary: 94 mg/dL (ref 70–99)
Glucose-Capillary: 95 mg/dL (ref 70–99)

## 2019-10-19 LAB — OSMOLALITY: Osmolality: 300 mOsm/kg — ABNORMAL HIGH (ref 275–295)

## 2019-10-19 LAB — HIV ANTIBODY (ROUTINE TESTING W REFLEX): HIV Screen 4th Generation wRfx: NONREACTIVE

## 2019-10-19 MED ORDER — TAMSULOSIN HCL 0.4 MG PO CAPS
0.4000 mg | ORAL_CAPSULE | Freq: Every day | ORAL | Status: DC
  Administered 2019-10-19 – 2019-10-26 (×7): 0.4 mg via ORAL
  Filled 2019-10-19 (×7): qty 1

## 2019-10-19 MED ORDER — NICOTINE 21 MG/24HR TD PT24
21.0000 mg | MEDICATED_PATCH | Freq: Every day | TRANSDERMAL | Status: DC
  Administered 2019-10-19 – 2019-10-26 (×8): 21 mg via TRANSDERMAL
  Filled 2019-10-19 (×8): qty 1

## 2019-10-19 MED ORDER — INSULIN ASPART 100 UNIT/ML ~~LOC~~ SOLN
0.0000 [IU] | Freq: Three times a day (TID) | SUBCUTANEOUS | Status: DC
  Administered 2019-10-20: 3 [IU] via SUBCUTANEOUS
  Administered 2019-10-21: 09:00:00 8 [IU] via SUBCUTANEOUS
  Administered 2019-10-21: 5 [IU] via SUBCUTANEOUS
  Administered 2019-10-22: 09:00:00 3 [IU] via SUBCUTANEOUS
  Administered 2019-10-23: 13:00:00 5 [IU] via SUBCUTANEOUS
  Filled 2019-10-19 (×8): qty 1

## 2019-10-19 MED ORDER — ENOXAPARIN SODIUM 40 MG/0.4ML ~~LOC~~ SOLN
40.0000 mg | SUBCUTANEOUS | Status: DC
  Administered 2019-10-19 – 2019-10-23 (×5): 40 mg via SUBCUTANEOUS
  Filled 2019-10-19 (×6): qty 0.4

## 2019-10-19 MED ORDER — SODIUM CHLORIDE 0.9 % IV SOLN: INTRAVENOUS | Status: DC

## 2019-10-19 MED ORDER — POTASSIUM CHLORIDE CRYS ER 20 MEQ PO TBCR
40.0000 meq | EXTENDED_RELEASE_TABLET | Freq: Once | ORAL | Status: AC
  Administered 2019-10-19: 40 meq via ORAL
  Filled 2019-10-19: qty 2

## 2019-10-19 MED ORDER — MAGIC MOUTHWASH
10.0000 mL | Freq: Four times a day (QID) | ORAL | Status: DC
  Administered 2019-10-19 – 2019-10-26 (×26): 10 mL via ORAL
  Filled 2019-10-19 (×33): qty 10

## 2019-10-19 MED ORDER — OXYCODONE-ACETAMINOPHEN 7.5-325 MG PO TABS
1.0000 | ORAL_TABLET | ORAL | Status: DC | PRN
  Administered 2019-10-19 – 2019-10-20 (×2): 1 via ORAL
  Filled 2019-10-19 (×2): qty 1

## 2019-10-19 MED ORDER — INSULIN REGULAR(HUMAN) IN NACL 100-0.9 UT/100ML-% IV SOLN: INTRAVENOUS | Status: DC

## 2019-10-19 MED ORDER — MAGIC MOUTHWASH W/LIDOCAINE: 15.0000 mL | Freq: Four times a day (QID) | ORAL | Status: DC

## 2019-10-19 MED ORDER — INSULIN ASPART 100 UNIT/ML ~~LOC~~ SOLN
0.0000 [IU] | Freq: Every day | SUBCUTANEOUS | Status: DC
  Administered 2019-10-21: 3 [IU] via SUBCUTANEOUS
  Filled 2019-10-19: qty 1

## 2019-10-19 MED ORDER — FAMOTIDINE 20 MG PO TABS
20.0000 mg | ORAL_TABLET | Freq: Every day | ORAL | Status: DC
  Administered 2019-10-19 – 2019-10-21 (×2): 20 mg via ORAL
  Filled 2019-10-19 (×2): qty 1

## 2019-10-19 MED ORDER — CLOPIDOGREL BISULFATE 75 MG PO TABS
75.0000 mg | ORAL_TABLET | Freq: Every day | ORAL | Status: DC
  Administered 2019-10-19 – 2019-10-26 (×7): 75 mg via ORAL
  Filled 2019-10-19 (×7): qty 1

## 2019-10-19 MED ORDER — MORPHINE SULFATE (PF) 4 MG/ML IV SOLN
4.0000 mg | INTRAVENOUS | Status: DC | PRN
  Administered 2019-10-20 – 2019-10-21 (×4): 4 mg via INTRAVENOUS
  Filled 2019-10-19 (×4): qty 1

## 2019-10-19 MED ORDER — DEXTROSE-NACL 5-0.45 % IV SOLN: INTRAVENOUS | Status: DC

## 2019-10-19 MED ORDER — MAGIC MOUTHWASH
5.0000 mL | Freq: Once | ORAL | Status: AC
  Administered 2019-10-19: 5 mL via ORAL
  Filled 2019-10-19: qty 5

## 2019-10-19 MED ORDER — INSULIN DETEMIR 100 UNIT/ML ~~LOC~~ SOLN
25.0000 [IU] | Freq: Two times a day (BID) | SUBCUTANEOUS | Status: DC
  Administered 2019-10-19 – 2019-10-23 (×8): 25 [IU] via SUBCUTANEOUS
  Filled 2019-10-19 (×12): qty 0.25

## 2019-10-19 MED ORDER — LIDOCAINE VISCOUS HCL 2 % MT SOLN
5.0000 mL | Freq: Four times a day (QID) | OROMUCOSAL | Status: DC
  Administered 2019-10-19 – 2019-10-26 (×23): 5 mL via OROMUCOSAL
  Filled 2019-10-19 (×17): qty 15

## 2019-10-19 MED ORDER — INSULIN ASPART 100 UNIT/ML ~~LOC~~ SOLN
0.0000 [IU] | Freq: Three times a day (TID) | SUBCUTANEOUS | Status: DC
  Administered 2019-10-19: 3 [IU] via SUBCUTANEOUS
  Filled 2019-10-19: qty 1

## 2019-10-19 MED ORDER — DEXTROSE 50 % IV SOLN: 0.0000 mL | INTRAVENOUS | Status: DC | PRN

## 2019-10-19 MED ORDER — AMLODIPINE BESYLATE 10 MG PO TABS
10.0000 mg | ORAL_TABLET | Freq: Every day | ORAL | Status: DC
  Administered 2019-10-19 – 2019-10-26 (×7): 10 mg via ORAL
  Filled 2019-10-19 (×6): qty 1
  Filled 2019-10-19: qty 2

## 2019-10-19 MED ORDER — TRAMADOL HCL 50 MG PO TABS
100.0000 mg | ORAL_TABLET | Freq: Four times a day (QID) | ORAL | Status: DC | PRN
  Administered 2019-10-22 – 2019-10-23 (×3): 100 mg via ORAL
  Filled 2019-10-19 (×4): qty 2

## 2019-10-19 MED ORDER — GABAPENTIN 300 MG PO CAPS
300.0000 mg | ORAL_CAPSULE | Freq: Two times a day (BID) | ORAL | Status: DC
  Administered 2019-10-19 – 2019-10-26 (×15): 300 mg via ORAL
  Filled 2019-10-19 (×15): qty 1

## 2019-10-19 MED ORDER — ACETAMINOPHEN 325 MG PO TABS: 650.0000 mg | ORAL_TABLET | Freq: Four times a day (QID) | ORAL | Status: DC | PRN

## 2019-10-19 NOTE — Progress Notes (Signed)
Inpatient Diabetes Program Recommendations  AACE/ADA: New Consensus Statement on Inpatient Glycemic Control (2015)  Target Ranges:  Prepandial:   less than 140 mg/dL      Peak postprandial:   less than 180 mg/dL (1-2 hours)      Critically ill patients:  140 - 180 mg/dL   Lab Results  Component Value Date   GLUCAP 177 (H) 10/19/2019    Review of Glycemic Control  Diabetes history: DM 2 Outpatient Diabetes medications: Levemir 25 units bid Current orders for Inpatient glycemic control:  IV insulin transitioning to Levemir 25 units bid Novolog 0-15 units tid + hs  A1c pending  Inpatient Diabetes Program Recommendations:    Noted pt with DKA, noncompliant with insulin. From [place] relocating to [facility]. Will need TOC assistance with meds and follow up, have placed order.   Will follow pt and will touch based with pt prior to d/c.  Thanks,  Christena Deem RN, MSN, BC-ADM Inpatient Diabetes Coordinator Team Pager [phone] (8a-5p)

## 2019-10-19 NOTE — ED Notes (Signed)
Pt AOx4, NAD noted. Pt c/o right flank pain. Pt provided ice water, TV remote, and urinal.

## 2019-10-19 NOTE — ED Notes (Signed)
Pt given warm blanket and diet sprite

## 2019-10-19 NOTE — ED Notes (Signed)
Bladder scan > 563cc. Pt attempting to void however unsuccessful at this time. No urine output since before 0700 this am, Admitting MD aware. Pt c/o RLQ pain, Admitting MD aware.

## 2019-10-19 NOTE — Progress Notes (Signed)
PROGRESS NOTE    Samuel [name]   UVO:[id number]  DOB: Jul 21, 1960  PCP: [facility]    DOA: 10/18/2019 LOS: 1   Brief Narrative   Samuel [name] is an 59 y.o. male with history of coronary disease status post stent x1 in 2019, diabetes mellitus type 2 on insulin therapy complicated by DM neuropathy, essential hypertension and history of right-sided nephrolithiasis status post stent in the past. Patient is a resident of [place], and relocated to [place] area.  Presented on 4/9 with intractable nausea and vomiting, right flank pain.  In the ED, glucose 900.  Patient had missed some of his insulin since moving.    Imaging studies did reveal right-sided nephrolithiasis with a 58mm distal ureteral stone.  Admitted to hospitalist service for admission and further management.   Assessment & Plan   Principal Problem:   Ureterolithiasis Active Problems:   Hyperglycemic crisis in diabetes mellitus ([place])   Ureterolithiasis, right-sided -patient with severe right flank pain with now radiating to the groin. --Urology consulted --Continue IV fluids and Flomax --Pain control per orders  Hyperglycemic crisis -resolved.  Patient transitioned off insulin drip and on subcutaneous insulin. --Resume Levemir 25 units twice daily --Sliding scale NovoLog  Insulin-dependent type 2 diabetes complicated by peripheral neuropathy --Insulin as above --Continue gabapentin  Mouth pain, suspect oral thrush  --Magic mouthwash with lidocaine as needed  Coronary artery disease, with prior stent.  Stable and no active chest pain.  Continue Plavix.  Patient BMI: Body mass index is 31.01 kg/m.   DVT prophylaxis: Lovenox Diet:  Diet Orders (From admission, onward)    Start     Ordered   10/20/19 0001  Diet NPO time specified  Diet effective midnight     10/19/19 1940   10/19/19 0827  Diet Carb Modified Fluid consistency: Thin; Room service appropriate? Yes  Diet effective now      Question Answer Comment  Diet-HS Snack? Nothing   Calorie Level Medium 1600-2000   Fluid consistency: Thin   Room service appropriate? Yes      10/19/19 0826            Code Status: Full Code    Subjective 10/19/19    Patient seen at bedside in the ED today on hold for bed.  He was boarding severe right flank and groin pain.  States has not had pain medicine for about 4 hours.  Nausea vomiting is improved.  He complains of severe mouth pain mostly on the side of his tongue.  He denies fevers or chills.   Disposition Plan & Communication   Dispo & Barriers: Pending clinical improvement and clearance by urology Coming from: Home Exp d/c date: Expect couple days Medically stable for d/c?  No  Family Communication: None at bedside during encounter, patient to update   Consults, Procedures, Significant Events   Consultants:   Urology  Procedures:   None  Antimicrobials:   None    Objective   Vitals:   10/19/19 2000 10/19/19 2030 10/19/19 2145 10/19/19 2158  BP:    (!) 158/88  Pulse: (!) 112 (!) 109 (!) 123 (!) 106  Resp: 19 15 16 16   Temp:      TempSrc:      SpO2: 97% 93% 95% 95%  Weight:      Height:        Intake/Output Summary (Last 24 hours) at 10/19/2019 2243 Last data filed at 10/19/2019 0515 Gross per 24 hour  Intake 888.59  ml  Output 125 ml  Net 763.59 ml   Filed Weights   10/18/19 1652  Weight: 95.3 kg    Physical Exam:  General exam: awake, alert, appears uncomfortable in pain HEENT: Left-sided tongue with ulcer, posterior oropharynx with few scattered white exudates, moist mucus membranes, hearing grossly normal  Respiratory system: CTAB, no wheezes, rales or rhonchi, normal respiratory effort. Cardiovascular system: normal S1/S2, RRR, no JVD, murmurs, rubs, gallops, no pedal edema.   Gastrointestinal system: soft, right lower quadrant tender, ND, no HSM felt, +bowel sounds. Central nervous system: A&O x4. no gross focal neurologic  deficits, normal speech Extremities: moves all, no edema, normal tone Skin: dry, intact, normal temperature, normal color Psychiatry: Anxious mood, congruent affect, judgement and insight appear normal  Labs   Data Reviewed: I have personally reviewed following labs and imaging studies  CBC: Recent Labs  Lab 10/18/19 1707  WBC 18.6*  HGB 15.9  HCT 45.4  MCV 91.2  PLT 374   Basic Metabolic Panel: Recent Labs  Lab 10/18/19 1707 10/19/19 0742  NA 134* 137  K 4.5 3.3*  CL 86* 98  CO2 24 29  GLUCOSE 972* 190*  BUN 22* 24*  CREATININE 1.41* 1.23  CALCIUM 9.5 8.5*   GFR: Estimated Creatinine Clearance: 76.4 mL/min (by C-G formula based on SCr of 1.23 mg/dL). Liver Function Tests: Recent Labs  Lab 10/18/19 1707  AST 22  ALT 16  ALKPHOS 143*  BILITOT 1.5*  PROT 8.3*  ALBUMIN 4.0   Recent Labs  Lab 10/18/19 1707  LIPASE 18   No results for input(s): AMMONIA in the last 168 hours. Coagulation Profile: No results for input(s): INR, PROTIME in the last 168 hours. Cardiac Enzymes: No results for input(s): CKTOTAL, CKMB, CKMBINDEX, TROPONINI in the last 168 hours. BNP (last 3 results) No results for input(s): PROBNP in the last 8760 hours. HbA1C: No results for input(s): HGBA1C in the last 72 hours. CBG: Recent Labs  Lab 10/19/19 0940 10/19/19 1057 10/19/19 1216 10/19/19 1737 10/19/19 2145  GLUCAP 182* 117* 144* 95 94   Lipid Profile: No results for input(s): CHOL, HDL, LDLCALC, TRIG, CHOLHDL, LDLDIRECT in the last 72 hours. Thyroid Function Tests: No results for input(s): TSH, T4TOTAL, FREET4, T3FREE, THYROIDAB in the last 72 hours. Anemia Panel: No results for input(s): VITAMINB12, FOLATE, FERRITIN, TIBC, IRON, RETICCTPCT in the last 72 hours. Sepsis Labs: Recent Labs  Lab 10/18/19 1831 10/19/19 0244  LATICACIDVEN 5.7* 4.6*    Recent Results (from the past 240 hour(s))  Respiratory Panel by RT PCR (Flu A&B, Covid) - Nasopharyngeal Swab      Status: None   Collection Time: 10/18/19  6:31 PM   Specimen: Nasopharyngeal Swab  Result Value Ref Range Status   SARS Coronavirus 2 by RT PCR NEGATIVE NEGATIVE Final    Comment: (NOTE) SARS-CoV-2 target nucleic acids are NOT DETECTED. The SARS-CoV-2 RNA is generally detectable in upper respiratoy specimens during the acute phase of infection. The lowest concentration of SARS-CoV-2 viral copies this assay can detect is 131 copies/mL. A negative result does not preclude SARS-Cov-2 infection and should not be used as the sole basis for treatment or other patient management decisions. A negative result may occur with  improper specimen collection/handling, submission of specimen other than nasopharyngeal swab, presence of viral mutation(s) within the areas targeted by this assay, and inadequate number of viral copies (<131 copies/mL). A negative result must be combined with clinical observations, patient history, and epidemiological information. The expected result  is Negative. Fact Sheet for Patients:  https://www.moore.com/ Fact Sheet for Healthcare Providers:  https://www.young.biz/ This test is not yet ap proved or cleared by the [place] FDA and  has been authorized for detection and/or diagnosis of SARS-CoV-2 by FDA under an Emergency Use Authorization (EUA). This EUA will remain  in effect (meaning this test can be used) for the duration of the COVID-19 declaration under Section 564(b)(1) of the Act, 21 U.S.C. section 360bbb-3(b)(1), unless the authorization is terminated or revoked sooner.    Influenza A by PCR NEGATIVE NEGATIVE Final   Influenza B by PCR NEGATIVE NEGATIVE Final    Comment: (NOTE) The Xpert Xpress SARS-CoV-2/FLU/RSV assay is intended as an aid in  the diagnosis of influenza from Nasopharyngeal swab specimens and  should not be used as a sole basis for treatment. Nasal washings and  aspirates are unacceptable for  Xpert Xpress SARS-CoV-2/FLU/RSV  testing. Fact Sheet for Patients: https://www.moore.com/ Fact Sheet for Healthcare Providers: https://www.young.biz/ This test is not yet approved or cleared by the [place] FDA and  has been authorized for detection and/or diagnosis of SARS-CoV-2 by  FDA under an Emergency Use Authorization (EUA). This EUA will remain  in effect (meaning this test can be used) for the duration of the  Covid-19 declaration under Section 564(b)(1) of the Act, 21  U.S.C. section 360bbb-3(b)(1), unless the authorization is  terminated or revoked. Performed at [facility], [address], [place], [place] [address]       Imaging Studies   DG Abd 1 View  Result Date: 10/19/2019 CLINICAL DATA:  Right flank pain. EXAM: ABDOMEN - 1 VIEW COMPARISON:  None. FINDINGS: The bowel gas pattern is normal. A moderate amount of stool is seen within the ascending and transverse colon. No radio-opaque calculi or other significant radiographic abnormality are seen. Multiple radiopaque surgical coils are seen overlying the left hemipelvis. IMPRESSION: Negative. Electronically Signed   By: Aram Candela M.D.   On: 10/19/2019 20:05   CT Renal Stone Study  Result Date: 10/18/2019 CLINICAL DATA:  59 year old male with flank pain. Concern for kidney stone. EXAM: CT ABDOMEN AND PELVIS WITHOUT CONTRAST TECHNIQUE: Multidetector CT imaging of the abdomen and pelvis was performed following the standard protocol without IV contrast. COMPARISON:  CT abdomen pelvis dated 10/01/2013. FINDINGS: Evaluation of this exam is limited in the absence of intravenous contrast. Lower chest: The visualized lung bases are clear. No intra-abdominal free air or free fluid. Hepatobiliary: The liver is unremarkable. No intrahepatic biliary ductal dilatation. The gallbladder is unremarkable. Pancreas: Unremarkable. No pancreatic ductal dilatation or surrounding  inflammatory changes. Spleen: Normal in size without focal abnormality. Adrenals/Urinary Tract: The adrenal glands are unremarkable. There is a 3 cm left renal inferior pole cyst. There is a 5 mm stone in the distal right ureter. There is minimal right hydronephrosis. No stone identified within the right kidney. There is no hydronephrosis or nephrolithiasis on the left. The left ureter and urinary bladder appear unremarkable. Stomach/Bowel: There is minimal circumferential thickening of the distal esophagus which may be partly related to underdistention or represent mild esophagitis. Clinical correlation is recommended. There is moderate stool throughout the colon. There is no bowel obstruction or active inflammation. Normal appendix. Vascular/Lymphatic: Minimal aortoiliac atherosclerotic disease. The IVC is unremarkable. No portal venous gas. There is no adenopathy. Reproductive: The prostate and seminal vesicles are grossly unremarkable. No pelvic mass. Scattered calcification of the peripheral corpus cavernosum of the penis noted, progressed since the prior CT. Other: None  Musculoskeletal: Osteopenia with degenerative changes of the spine. No acute osseous pathology. IMPRESSION: 1. A 5 mm distal right ureteral stone with minimal right hydronephrosis. 2. No bowel obstruction. Normal appendix. 3. Mild circumferential thickening of the distal esophagus may be related to underdistention or represent mild esophagitis. Clinical correlation is recommended. 4. Aortic Atherosclerosis (ICD10-I70.0). Electronically Signed   By: Elgie Collard M.D.   On: 10/18/2019 19:51     Medications   Scheduled Meds: . amLODipine  10 mg Oral Daily  . clopidogrel  75 mg Oral Daily  . enoxaparin (LOVENOX) injection  40 mg Subcutaneous Q24H  . famotidine  20 mg Oral Daily  . gabapentin  300 mg Oral BID  . insulin aspart  0-15 Units Subcutaneous TID WC  . insulin aspart  0-5 Units Subcutaneous QHS  . insulin detemir  25 Units  Subcutaneous BID  . magic mouthwash  10 mL Oral QID   And  . lidocaine  5 mL Mouth/Throat QID  . nicotine  21 mg Transdermal Daily  . tamsulosin  0.4 mg Oral Daily   Continuous Infusions: . sodium chloride 75 mL/hr at 10/19/19 1747  . sodium chloride Stopped (10/19/19 0205)  . sodium chloride    . dextrose 5 % and 0.45% NaCl Stopped (10/19/19 1544)  . dextrose 5 % and 0.45% NaCl Stopped (10/19/19 0204)  . insulin Stopped (10/19/19 1101)       LOS: 1 day    Time spent: 35 MINUTES    Pennie Banter, DO [facility]   If 7PM-7AM, please contact night-coverage [email or web address] 10/19/2019, 10:43 PM

## 2019-10-19 NOTE — ED Notes (Signed)
Report given to Lexi RN.

## 2019-10-19 NOTE — ED Notes (Signed)
Meal tray provided. Pt c/o mouth pain. Apple sauce given. Pending Levemir from pharmacy. Will transition to SQ insulin as appropriate.

## 2019-10-19 NOTE — Progress Notes (Signed)
I was called by Dr. Denton Lank regarding Samuel [name].   I have reviewed his chart, imaging and labs and discussed the case with Dr. Denton Lank.   He pain was controlled and he had no evidence of infection.    I have put in an order for a KUB for tomorrow morning and will make him NPO p MN.   I have asked Dr. Richardo Hanks him on rounds in the morning to assess the need for intervention.

## 2019-10-19 NOTE — ED Notes (Signed)
Pt transported to D pod by [employer]

## 2019-10-19 NOTE — ED Notes (Signed)
Unable to give meds, pending verification by pharmacy.

## 2019-10-19 NOTE — ED Notes (Addendum)
Downgrade requested of Acheampong, MD. Awaiting response.

## 2019-10-19 NOTE — ED Notes (Signed)
Pt provided ice water per his request. No further needs identified/verbalized by pt.

## 2019-10-19 NOTE — ED Notes (Signed)
Requested down-grade of NP Jon Billings per charge RN request, awaiting response.

## 2019-10-20 ENCOUNTER — Inpatient Hospital Stay: Payer: Medicaid Other

## 2019-10-20 ENCOUNTER — Inpatient Hospital Stay: Payer: Medicaid Other | Admitting: Anesthesiology

## 2019-10-20 ENCOUNTER — Encounter
Admission: EM | Disposition: A | Discharge status: Other Acute Inpatient Hospital | Payer: Self-pay | Source: Home / Self Care | Attending: Internal Medicine

## 2019-10-20 ENCOUNTER — Encounter: Payer: Self-pay | Admitting: Internal Medicine

## 2019-10-20 DIAGNOSIS — N201 Calculus of ureter: Secondary | ICD-10-CM | POA: Diagnosis not present

## 2019-10-20 HISTORY — PX: CYSTOSCOPY/URETEROSCOPY/HOLMIUM LASER/STENT PLACEMENT: SHX6546

## 2019-10-20 LAB — CBC WITH DIFFERENTIAL/PLATELET
Abs Immature Granulocytes: 0.06 10*3/uL (ref 0.00–0.07)
Basophils Absolute: 0 10*3/uL (ref 0.0–0.1)
Basophils Relative: 0 %
Eosinophils Absolute: 0 10*3/uL (ref 0.0–0.5)
Eosinophils Relative: 0 %
HCT: 40.3 % (ref 39.0–52.0)
Hemoglobin: 13.7 g/dL (ref 13.0–17.0)
Immature Granulocytes: 0 %
Lymphocytes Relative: 15 %
Lymphs Abs: 2.6 10*3/uL (ref 0.7–4.0)
MCH: 32.1 pg (ref 26.0–34.0)
MCHC: 34 g/dL (ref 30.0–36.0)
MCV: 94.4 fL (ref 80.0–100.0)
Monocytes Absolute: 0.8 10*3/uL (ref 0.1–1.0)
Monocytes Relative: 5 %
Neutro Abs: 14.3 10*3/uL — ABNORMAL HIGH (ref 1.7–7.7)
Neutrophils Relative %: 80 %
Platelets: 287 10*3/uL (ref 150–400)
RBC: 4.27 MIL/uL (ref 4.22–5.81)
RDW: 12.2 % (ref 11.5–15.5)
WBC: 17.8 10*3/uL — ABNORMAL HIGH (ref 4.0–10.5)
nRBC: 0 % (ref 0.0–0.2)

## 2019-10-20 LAB — BASIC METABOLIC PANEL
Anion gap: 9 (ref 5–15)
BUN: 17 mg/dL (ref 6–20)
CO2: 29 mmol/L (ref 22–32)
Calcium: 8.3 mg/dL — ABNORMAL LOW (ref 8.9–10.3)
Chloride: 99 mmol/L (ref 98–111)
Creatinine, Ser: 0.97 mg/dL (ref 0.61–1.24)
GFR calc Af Amer: >60 mL/min (ref >60)
GFR calc non Af Amer: >60 mL/min (ref >60)
Glucose, Bld: 156 mg/dL — ABNORMAL HIGH (ref 70–99)
Potassium: 3 mmol/L — ABNORMAL LOW (ref 3.5–5.1)
Sodium: 137 mmol/L (ref 135–145)

## 2019-10-20 LAB — GLUCOSE, CAPILLARY
Glucose-Capillary: 153 mg/dL — ABNORMAL HIGH (ref 70–99)
Glucose-Capillary: 117 mg/dL — ABNORMAL HIGH (ref 70–99)
Glucose-Capillary: 121 mg/dL — ABNORMAL HIGH (ref 70–99)
Glucose-Capillary: 193 mg/dL — ABNORMAL HIGH (ref 70–99)

## 2019-10-20 LAB — LACTIC ACID, PLASMA
Lactic Acid, Venous: 0.9 mmol/L (ref 0.5–1.9)
Lactic Acid, Venous: 1.2 mmol/L (ref 0.5–1.9)

## 2019-10-20 SURGERY — CYSTOSCOPY/URETEROSCOPY/HOLMIUM LASER/STENT PLACEMENT
Anesthesia: General | Laterality: Right

## 2019-10-20 MED ORDER — LACTATED RINGERS IV SOLN: INTRAVENOUS | Status: DC | PRN

## 2019-10-20 MED ORDER — MIDAZOLAM HCL 2 MG/2ML IJ SOLN
INTRAMUSCULAR | Status: AC
  Filled 2019-10-20: qty 2

## 2019-10-20 MED ORDER — LIDOCAINE HCL (CARDIAC) PF 100 MG/5ML IV SOSY
PREFILLED_SYRINGE | INTRAVENOUS | Status: DC | PRN
  Administered 2019-10-20: 100 mg via INTRAVENOUS

## 2019-10-20 MED ORDER — ONDANSETRON HCL 4 MG/2ML IJ SOLN
INTRAMUSCULAR | Status: DC | PRN
  Administered 2019-10-20: 4 mg via INTRAVENOUS

## 2019-10-20 MED ORDER — ROCURONIUM BROMIDE 100 MG/10ML IV SOLN
INTRAVENOUS | Status: DC | PRN
  Administered 2019-10-20: 30 mg via INTRAVENOUS

## 2019-10-20 MED ORDER — DEXAMETHASONE SODIUM PHOSPHATE 10 MG/ML IJ SOLN
INTRAMUSCULAR | Status: DC | PRN
  Administered 2019-10-20: 5 mg via INTRAVENOUS

## 2019-10-20 MED ORDER — CEFAZOLIN SODIUM-DEXTROSE 2-3 GM-%(50ML) IV SOLR
INTRAVENOUS | Status: DC | PRN
  Administered 2019-10-20: 2 g via INTRAVENOUS

## 2019-10-20 MED ORDER — HYDRALAZINE HCL 20 MG/ML IJ SOLN
10.0000 mg | INTRAMUSCULAR | Status: DC | PRN
  Administered 2019-10-20: 10 mg via INTRAVENOUS
  Filled 2019-10-20 (×2): qty 0.5

## 2019-10-20 MED ORDER — GLUCERNA SHAKE PO LIQD
237.0000 mL | Freq: Three times a day (TID) | ORAL | Status: DC
  Administered 2019-10-20 – 2019-10-25 (×8): 237 mL via ORAL

## 2019-10-20 MED ORDER — INSULIN DETEMIR 100 UNIT/ML ~~LOC~~ SOLN
10.0000 [IU] | Freq: Once | SUBCUTANEOUS | Status: DC
  Filled 2019-10-20: qty 0.1

## 2019-10-20 MED ORDER — PHENYLEPHRINE HCL (PRESSORS) 10 MG/ML IV SOLN
INTRAVENOUS | Status: DC | PRN
  Administered 2019-10-20 (×2): 200 ug via INTRAVENOUS
  Administered 2019-10-20: 100 ug via INTRAVENOUS

## 2019-10-20 MED ORDER — ONDANSETRON HCL 4 MG/2ML IJ SOLN
INTRAMUSCULAR | Status: AC
  Filled 2019-10-20: qty 2

## 2019-10-20 MED ORDER — MIDAZOLAM HCL 2 MG/2ML IJ SOLN
INTRAMUSCULAR | Status: DC | PRN
  Administered 2019-10-20: 2 mg via INTRAVENOUS

## 2019-10-20 MED ORDER — PROPOFOL 10 MG/ML IV BOLUS
INTRAVENOUS | Status: DC | PRN
  Administered 2019-10-20: 200 mg via INTRAVENOUS

## 2019-10-20 MED ORDER — SUCCINYLCHOLINE CHLORIDE 20 MG/ML IJ SOLN
INTRAMUSCULAR | Status: DC | PRN
  Administered 2019-10-20: 100 mg via INTRAVENOUS

## 2019-10-20 MED ORDER — FENTANYL CITRATE (PF) 100 MCG/2ML IJ SOLN
INTRAMUSCULAR | Status: AC
  Filled 2019-10-20: qty 2

## 2019-10-20 MED ORDER — CEFAZOLIN SODIUM-DEXTROSE 1-4 GM/50ML-% IV SOLN
1.0000 g | Freq: Once | INTRAVENOUS | Status: AC
  Administered 2019-10-20: 1 g via INTRAVENOUS
  Filled 2019-10-20: qty 50

## 2019-10-20 MED ORDER — FENTANYL CITRATE (PF) 100 MCG/2ML IJ SOLN
INTRAMUSCULAR | Status: DC | PRN
  Administered 2019-10-20: 50 ug via INTRAVENOUS

## 2019-10-20 MED ORDER — PROMETHAZINE HCL 25 MG/ML IJ SOLN: 6.2500 mg | INTRAMUSCULAR | Status: DC | PRN

## 2019-10-20 MED ORDER — SUGAMMADEX SODIUM 500 MG/5ML IV SOLN
INTRAVENOUS | Status: DC | PRN
  Administered 2019-10-20: 200 mg via INTRAVENOUS

## 2019-10-20 MED ORDER — IOHEXOL 180 MG/ML  SOLN
INTRAMUSCULAR | Status: DC | PRN
  Administered 2019-10-20: 20 mL

## 2019-10-20 MED ORDER — KETOROLAC TROMETHAMINE 30 MG/ML IJ SOLN
INTRAMUSCULAR | Status: DC | PRN
  Administered 2019-10-20: 15 mg via INTRAVENOUS

## 2019-10-20 MED ORDER — PROPOFOL 10 MG/ML IV BOLUS
INTRAVENOUS | Status: AC
  Filled 2019-10-20: qty 20

## 2019-10-20 MED ORDER — ADULT MULTIVITAMIN W/MINERALS CH
1.0000 | ORAL_TABLET | Freq: Every day | ORAL | Status: DC
  Administered 2019-10-21 – 2019-10-26 (×6): 1 via ORAL
  Filled 2019-10-20 (×6): qty 1

## 2019-10-20 MED ORDER — FENTANYL CITRATE (PF) 100 MCG/2ML IJ SOLN: 25.0000 ug | INTRAMUSCULAR | Status: DC | PRN

## 2019-10-20 SURGICAL SUPPLY — 30 items
BAG DRAIN CYSTO-URO LG1000N (MISCELLANEOUS) ×3 IMPLANT
BASKET ZERO TIP 1.9FR (BASKET) ×3 IMPLANT
BRUSH SCRUB EZ 1% IODOPHOR (MISCELLANEOUS) ×3 IMPLANT
CATH URETL 5X70 OPEN END (CATHETERS) IMPLANT
CNTNR SPEC 2.5X3XGRAD LEK (MISCELLANEOUS)
CONT SPEC 4OZ STER OR WHT (MISCELLANEOUS)
CONTAINER SPEC 2.5X3XGRAD LEK (MISCELLANEOUS) IMPLANT
DRAPE UTILITY 15X26 TOWEL STRL (DRAPES) ×3 IMPLANT
FIBER LASER TRAC TIP (UROLOGICAL SUPPLIES) ×3 IMPLANT
GLOVE BIOGEL PI IND STRL 7.5 (GLOVE) ×1 IMPLANT
GLOVE BIOGEL PI INDICATOR 7.5 (GLOVE) ×2
GOWN STRL REUS W/ TWL LRG LVL3 (GOWN DISPOSABLE) ×1 IMPLANT
GOWN STRL REUS W/ TWL XL LVL3 (GOWN DISPOSABLE) ×1 IMPLANT
GOWN STRL REUS W/TWL LRG LVL3 (GOWN DISPOSABLE) ×2
GOWN STRL REUS W/TWL XL LVL3 (GOWN DISPOSABLE) ×2
GUIDEWIRE STR DUAL SENSOR (WIRE) ×3 IMPLANT
INFUSOR MANOMETER BAG 3000ML (MISCELLANEOUS) ×3 IMPLANT
INTRODUCER DILATOR DOUBLE (INTRODUCER) IMPLANT
KIT TURNOVER CYSTO (KITS) ×3 IMPLANT
PACK CYSTO AR (MISCELLANEOUS) ×3 IMPLANT
SET CYSTO W/LG BORE CLAMP LF (SET/KITS/TRAYS/PACK) ×3 IMPLANT
SHEATH URETERAL 12FRX35CM (MISCELLANEOUS) IMPLANT
SOL .9 NS 3000ML IRR  AL (IV SOLUTION) ×2
SOL .9 NS 3000ML IRR UROMATIC (IV SOLUTION) ×1 IMPLANT
STENT URET 6FRX24 CONTOUR (STENTS) IMPLANT
STENT URET 6FRX26 CONTOUR (STENTS) ×3 IMPLANT
SURGILUBE 2OZ TUBE FLIPTOP (MISCELLANEOUS) ×3 IMPLANT
SYR 10ML LL (SYRINGE) ×3 IMPLANT
VALVE UROSEAL ADJ ENDO (VALVE) IMPLANT
WATER STERILE IRR 1000ML POUR (IV SOLUTION) ×3 IMPLANT

## 2019-10-20 NOTE — Anesthesia Preprocedure Evaluation (Signed)
Anesthesia Evaluation  Patient identified by MRN, date of birth, ID band Patient awake    Reviewed: Allergy & Precautions, NPO status , Patient's Chart, lab work & pertinent test results  History of Anesthesia Complications Negative for: history of anesthetic complications  Airway Mallampati: II  TM Distance: >3 FB     Dental  (+) Poor Dentition, Missing, Dental Advidsory Given   Pulmonary neg pulmonary ROS, Patient abstained from smoking., former smoker,    Pulmonary exam normal        Cardiovascular Exercise Tolerance: Good hypertension, (-) angina+ CAD and + Cardiac Stents  (-) Past MI Normal cardiovascular exam(-) dysrhythmias (-) Valvular Problems/Murmurs     Neuro/Psych negative neurological ROS  negative psych ROS   GI/Hepatic Neg liver ROS, GERD  ,  Endo/Other  diabetes  Renal/GU negative Renal ROS  negative genitourinary   Musculoskeletal negative musculoskeletal ROS (+)   Abdominal Normal abdominal exam  (+)   Peds negative pediatric ROS (+)  Hematology negative hematology ROS (+)   Anesthesia Other Findings Past Medical History: No date: Diabetes mellitus without complication (HCC) No date: Hypertension No date: UTI (urinary tract infection)  Reproductive/Obstetrics                             Anesthesia Physical  Anesthesia Plan  ASA: II  Anesthesia Plan: General   Post-op Pain Management:    Induction: Intravenous, Rapid sequence and Cricoid pressure planned  PONV Risk Score and Plan: 2 and Ondansetron, Dexamethasone, Midazolam, Promethazine and Treatment may vary due to age or medical condition  Airway Management Planned: Oral ETT  Additional Equipment:   Intra-op Plan:   Post-operative Plan: Extubation in OR  Informed Consent: I have reviewed the patients History and Physical, chart, labs and discussed the procedure including the risks, benefits and  alternatives for the proposed anesthesia with the patient or authorized representative who has indicated his/her understanding and acceptance.     Dental advisory given  Plan Discussed with: CRNA and Surgeon  Anesthesia Plan Comments:         Anesthesia Quick Evaluation

## 2019-10-20 NOTE — Op Note (Signed)
Date of procedure: 10/20/19  Preoperative diagnosis:  1. Right 5 mm mid ureteral stone  Postoperative diagnosis:  1. Same  Procedure: 1. Cystoscopy, right retrograde pyelogram with intraoperative interpretation, right ureteroscopy and laser lithotripsy, basket extraction of stone fragment, right ureteral stent placement  Surgeon: Legrand Rams, MD  Anesthesia: General  Complications: None  Intraoperative findings:  1.  Normal cystoscopy, moderate size prostate, normal urethra 2.  Uncomplicated dusting of 5 mm mid ureteral stone 3.  Uncomplicated right ureteral stent placement  EBL: 0  Specimens: Stone for analysis  Drains: Right 6 French by 26 cm ureteral stent with Dangler attached  Indication: Samuel [name] is a 59 y.o. patient with a 5 mm right mid to distal ureteral stone and intractable pain with nausea and vomiting.  Urinalysis was benign and he elected for definitive management with ureteroscopy and laser lithotripsy.  After reviewing the management options for treatment, they elected to proceed with the above surgical procedure(s). We have discussed the potential benefits and risks of the procedure, side effects of the proposed treatment, the likelihood of the patient achieving the goals of the procedure, and any potential problems that might occur during the procedure or recuperation. Informed consent has been obtained.  Description of procedure:  The patient was taken to the operating room and general anesthesia was induced. SCDs were placed for DVT prophylaxis.The patient was placed in the dorsal lithotomy position, prepped and draped in the usual sterile fashion, and preoperative antibiotics(Ancef) were administered. A preoperative time-out was performed.   On exam, the patient's prior perirectal abscess managed a month ago by general surgery was well-healed with no evidence of induration or purulence.  A 21 French rigid cystoscope was used to intubate the urethra and  a normal-appearing urethra was followed proximally into the bladder.  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  Thorough cystoscopy was performed and the ureteral orifices were orthotopic bilaterally and there were no suspicious bladder lesions.  A retrograde pyelogram was performed on the right side and showed a filling defect in the mid to distal ureter consistent with the known stone with only minimal contrast that passed alongside the stone up into the collecting system.  A sensor wire passed easily alongside the stone up into the kidney and there was no evidence of any purulence.  A semirigid ureteroscope was advanced alongside the wire and a 5 mm yellow and black stone was identified in the mid ureter.  A 200 m laser fiber on settings of 0.5 J and 20 Hz was used to fragment the stone to <1 mm pieces.  A basket was used to extract 1 of these pieces to send for analysis.  The rigid cystoscope was then backloaded over the wire and a 6 [place] by 26 cm ureteral stent was uneventfully placed on the right side with an excellent curl proximally, as well as in the bladder under direct vision.  The Dangler was left attached.  The bladder was drained, and the Dangler was secured to the penis using Steri-Strips.  Disposition: Stable to PACU  Plan: Continue Flomax and Bactrim x5 days while stent in place Oxybutynin as needed for stent pain/bladder spasms Monitor overnight with history of leukocytosis and severe nausea/vomiting Patient can remove stent at home on Thursday 4/15  Legrand Rams, MD

## 2019-10-20 NOTE — ED Notes (Signed)
Pt transported by wheelchair by tech with cardiac monitor at this time. Belongings secured with pt.

## 2019-10-20 NOTE — Progress Notes (Signed)
Pt has an order for 1 tab percocet for severe pain and 4mg  morphine IV for breakthrough pain.  Dr. made aware, verbalized orders are fine, also ok to give 4mg  morphine.

## 2019-10-20 NOTE — Anesthesia Procedure Notes (Signed)
Procedure Name: Intubation Date/Time: 10/20/2019 5:28 PM Performed by: Almeta Monas, CRNA Pre-anesthesia Checklist: Patient identified, Patient being monitored, Timeout performed, Emergency Drugs available and Suction available Patient Re-evaluated:Patient Re-evaluated prior to induction Oxygen Delivery Method: Circle system utilized Preoxygenation: Pre-oxygenation with 100% oxygen Induction Type: IV induction, Rapid sequence and Cricoid Pressure applied Laryngoscope Size: 3 and McGraph Grade View: Grade I Tube type: Oral Tube size: 7.0 mm Number of attempts: 1 Airway Equipment and Method: Stylet,  Patient positioned with wedge pillow and Video-laryngoscopy Placement Confirmation: ETT inserted through vocal cords under direct vision,  positive ETCO2 and breath sounds checked- equal and bilateral Secured at: 21 cm Tube secured with: Tape Dental Injury: Teeth and Oropharynx as per pre-operative assessment

## 2019-10-20 NOTE — Progress Notes (Signed)
15 minute call to floor. 

## 2019-10-20 NOTE — Progress Notes (Addendum)
Urology Consult   I have been asked to see the patient by Dr. Phineas Inches, for evaluation and management of a 5 mm right distal ureteral stone.  Chief Complaint: Right-sided flank pain  HPI:  Samuel [name] is a 59 y.o. year old very comorbid male with history of poorly controlled diabetes, CAD with cardiac stent placement on Plavix, reported history of nontreated penile fracture as well as urethral stricture disease in the past, recurrent UTIs, right-sided nephrolithiasis, who is currently admitted with poorly controlled right-sided groin pain and nausea/vomiting secondary to a 5 mm right distal ureteral stone.  He reports he has been having pain on and off for the last 3 to 4 weeks on that right side.  He denies any fevers or chills.  Urinalysis on admission was completely benign with 0-5 RBCs, 0-5 WBCs, no bacteria, nitrite negative, no leukocytes.  He did have leukocytosis to 18 K which is persistent today.  Lactate was elevated to 5.7 on admission, however normalized to 0.9 with IV hydration.  He is a very poor historian, no outside records are available.  He reports that around 8 years ago he suffered a penile fracture and was never repaired and he has had fibrosis, penile scarring, and inability to achieve erections or ejaculation since that time.  He also reports about 1 year ago he had a traumatic Foley placement that required urology assistance at an outside hospital in [place], [place].  He denies any urinary symptoms of incontinence, weak stream, dribbling, urgency, or frequency.  He also reports that over a year ago he had a ureteral stent placed for a right-sided stone, however he is unsure if he ever had the stone removed.  Again, the outside records are unavailable to me, and it is difficult to elucidate a clear history from the patient.  He reports he has also had recurrent urinary tract infections in the past, but denies any UTIs over the last 6 months.  His history is also  notable for recent perirectal abscess drainage with Dr. Windell Moment on 09/20/2019.  He denies any complaints at this area today.    PMH: Past Medical History:  Diagnosis Date  . Diabetes mellitus without complication ([place])   . Hypertension   . UTI (urinary tract infection)     Surgical History: Past Surgical History:  Procedure Laterality Date  . CORONARY ANGIOPLASTY WITH STENT PLACEMENT    . FOOT AMPUTATION    . HERNIA REPAIR    . INCISION AND DRAINAGE PERIRECTAL ABSCESS N/A 09/20/2019   Procedure: IRRIGATION AND DEBRIDEMENT PERIRECTAL ABSCESS;  Surgeon: Herbert Pun, MD;  Location: [facility] ORS;  Service: General;  Laterality: N/A;   Allergies: No Known Allergies  Family History: History reviewed. No pertinent family history.  Social History:  reports that he has quit smoking. He has never used smokeless tobacco. He reports previous alcohol use. No history on file for drug.  ROS: Negative aside from those stated in the HPI.  Physical Exam: BP (!) 166/87 (BP Location: Right Arm)   Pulse (!) 105   Temp 98.3 F (36.8 C) (Oral)   Resp 15   Ht 5\' 10"  (1.778 m)   Wt 89.9 kg   SpO2 99%   BMI 28.44 kg/m    Constitutional:  Alert and oriented, No acute distress. Cardiovascular: Regular rate and rhythm Respiratory: Clear to auscultation bilaterally GI: Abdomen is soft, nontender, nondistended, no abdominal masses GU: Circumcised phallus with significant fibrosis of the corpora bilaterally, more on the right  than the left.  Widely patent meatus.  Laboratory Data: Reviewed, see HPI  Pertinent Imaging: I have personally reviewed the CT.  Notable for a 5 mm right mid to distal ureteral stone with mild upstream hydronephrosis.  No other nephrolithiasis.  Stone not clearly visible on KUB today.  Assessment & Plan:   In summary, is a very comorbid and complex 59 year old male admitted with uncontrolled right-sided groin pain and nausea vomiting secondary to a 5 mm right  mid to distal ureteral stone.  There is no evidence of infection on urinalysis, however he has persistent leukocytosis to 18 K.  Lactate has normalized with hydration.  He has been afebrile and normotensive with low-grade tachycardia.  We discussed treatment options at length for his 5 mm right distal ureteral stone.  He has failed a trial of medical expulsive therapy with uncontrolled pain and nausea/vomiting in greater than 48-hour hospitalization.  I recommended pursuing definitive treatment with ureteroscopy, laser lithotripsy and stent placement.  We discussed the possibility of upstream infection with his leukocytosis which would require staged treatment with temporary stent placement and return to the operating room in 2 to 3 weeks for definitive management of his stone after the infection is treated.  We also discussed possible inability to access the collecting system that would require nephrostomy tube.   We also discussed stent related symptoms including flank pain, urgency/frequency, and dysuria.  I also discussed possible need for temporary Foley placement if infection is found at the time of surgery to maximize drainage. He would like to pursue definitive management as discussed.  I also discussed the possibility of finding a urethral stricture at the time of surgery that would necessitate dilation/DVIU and possible Foley placement for 1 to 3 days and we discussed the risks of this procedure.  We specifically discussed the risks ureteroscopy including bleeding, infection/sepsis, stent related symptoms including flank pain/urgency/frequency/incontinence/dysuria, ureteral injury, inability to access stone, or need for staged or additional procedures.   Recommendations: -OR today for cystoscopy, right ureteroscopy, laser lithotripsy, stent placement. -Regarding his long-term erectile dysfunction after penile fracture, would recommend pursuing an inflatable penile prosthesis as an outpatient as  his best management option, partially in the setting of his poorly controlled diabetes -Please keep NPO until after surgery  [name], MD  Total time spent on the floor was 80 minutes, with greater than 50% spent in counseling and coordination of care with the patient regarding right ureteral stone, leukocytosis, erectile dysfunction.  [facility] [address] [place], [place] [address] [phone]

## 2019-10-20 NOTE — Anesthesia Postprocedure Evaluation (Signed)
Anesthesia Post Note  Patient: Samuel [name]  Procedure(s) Performed: CYSTOSCOPY/URETEROSCOPY/HOLMIUM LASER/STENT PLACEMENT (Right )  Patient location during evaluation: PACU Anesthesia Type: General Level of consciousness: awake and alert Pain management: pain level controlled Vital Signs Assessment: post-procedure vital signs reviewed and stable Respiratory status: spontaneous breathing, nonlabored ventilation, respiratory function stable and patient connected to nasal cannula oxygen Cardiovascular status: blood pressure returned to baseline and stable Postop Assessment: no apparent nausea or vomiting Anesthetic complications: no     Last Vitals:  Vitals:   10/20/19 1834 10/20/19 2046  BP: (!) 157/87 (!) 181/98  Pulse: (!) 113 (!) 120  Resp:  16  Temp:  37.2 C  SpO2: 97% 97%    Last Pain:  Vitals:   10/20/19 2046  TempSrc: Oral  PainSc:                  Lenard Simmer

## 2019-10-20 NOTE — Progress Notes (Addendum)
PROGRESS NOTE    Samuel [name]   TOI:[id number]  DOB: Jun 08, 1961  PCP: [facility]    DOA: 10/18/2019 LOS: 2   Brief Narrative   Samuel [name] is an 58 y.o. male with history of coronary disease status post stent x1 in 2019, diabetes mellitus type 2 on insulin therapy complicated by DM neuropathy, essential hypertension and history of right-sided nephrolithiasis status post stent in the past. Patient is a resident of [place], and relocated to [place] area.  Presented on 4/9 with intractable nausea and vomiting, right flank pain.  In the ED, glucose 900.  Patient had missed some of his insulin since moving.    Imaging studies did reveal right-sided nephrolithiasis with a 64mm distal ureteral stone.  Admitted to hospitalist service for admission and further management.   Assessment & Plan   Principal Problem:   Ureterolithiasis Active Problems:   Hyperglycemic crisis in diabetes mellitus (HCC)   Ureterolithiasis, right-sided -patient with severe right flank pain with now radiating to the groin. --Urology consulted, taking to surgery today --Continue IV fluids and Flomax --Pain control per orders  Leukocytosis -suspect this is reactive.  Patient is afebrile, UA was negative, no respiratory symptoms. --Check blood cultures --Monitor CBC --Monitor clinically for signs and symptoms of infection  Lactic acidosis -resolved.  Present on admission, most likely due to HHS, but also with leukocytosis, tachycardia.  He's afebrile, UA was negative, no respiratory symptoms.  --check blood cultures --follow lactate trend to normal  Hyperglycemic crisis -resolved.  Patient transitioned off insulin drip and on subcutaneous insulin. --Resume Levemir 25 units twice daily --Sliding scale NovoLog  Insulin-dependent type 2 diabetes complicated by peripheral neuropathy --Insulin as above --Continue gabapentin  Mouth pain, suspect oral thrush  --Magic mouthwash with  lidocaine qid  Coronary artery disease, with prior stent.  Stable and no active chest pain.  Continue Plavix.  Patient BMI: Body mass index is 28.44 kg/m.   DVT prophylaxis: Lovenox Diet:  Diet Orders (From admission, onward)    Start     Ordered   10/20/19 0001  Diet NPO time specified  Diet effective midnight     10/19/19 1940            Code Status: Full Code    Subjective 10/20/19    Patient seen at bedside this morning.  No acute events reported overnight.  He reports severe mouth pain and asking for Magic mouthwash.  Continues to have severe right groin and lower abdominal pain as well.  States unable to get comfortable.  Feels hot and cold but no fever.  No nausea vomiting.   Disposition Plan & Communication   Dispo & Barriers: Pending clinical improvement and clearance by urology.  Having surgery today. Coming from: Home Exp d/c date: Expect couple days Medically stable for d/c?  No  Family Communication: None at bedside during encounter, patient to update   Consults, Procedures, Significant Events   Consultants:   Urology  Procedures:   None  Antimicrobials:   None    Objective   Vitals:   10/19/19 2319 10/20/19 0040 10/20/19 0553 10/20/19 0729  BP:  (!) 174/98 (!) 186/89 (!) 166/87  Pulse:  (!) 113 (!) 112 (!) 105  Resp:  16 16   Temp: 98.2 F (36.8 C) 99.1 F (37.3 C) 97.6 F (36.4 C) 98.3 F (36.8 C)  TempSrc: Oral Oral Oral Oral  SpO2:  99% 100% 99%  Weight:  89.9 kg    Height:  5\' 10"  (1.778 m)      Intake/Output Summary (Last 24 hours) at 10/20/2019 0733 Last data filed at 10/19/2019 2332 Gross per 24 hour  Intake 600 ml  Output --  Net 600 ml   Filed Weights   10/18/19 1652 10/20/19 0040  Weight: 95.3 kg 89.9 kg    Physical Exam:  General exam: awake, alert, appears uncomfortable in pain HEENT: Left-sided tongue with ulcer, posterior oropharynx with few scattered white exudates, moist mucus membranes, hearing grossly  normal  Respiratory system: CTAB, no wheezes, rales or rhonchi, normal respiratory effort. Cardiovascular system: normal S1/S2, RRR, no JVD, murmurs, rubs, gallops, no pedal edema.   Gastrointestinal system: soft, right and mid lower abdomen tender with voluntary guarding, ND, no HSM felt, +bowel sounds. Central nervous system: A&O x4. no gross focal neurologic deficits, normal speech Extremities: moves all, no edema, normal tone Skin: dry, intact, normal temperature, normal color Psychiatry: Anxious mood, congruent affect, judgement and insight appear normal  Labs   Data Reviewed: I have personally reviewed following labs and imaging studies  CBC: Recent Labs  Lab 10/18/19 1707  WBC 18.6*  HGB 15.9  HCT 45.4  MCV 91.2  PLT 322   Basic Metabolic Panel: Recent Labs  Lab 10/18/19 1707 10/19/19 0742  NA 134* 137  K 4.5 3.3*  CL 86* 98  CO2 24 29  GLUCOSE 972* 190*  BUN 22* 24*  CREATININE 1.41* 1.23  CALCIUM 9.5 8.5*   GFR: Estimated Creatinine Clearance: 75.7 mL/min (by C-G formula based on SCr of 1.23 mg/dL). Liver Function Tests: Recent Labs  Lab 10/18/19 1707  AST 22  ALT 16  ALKPHOS 143*  BILITOT 1.5*  PROT 8.3*  ALBUMIN 4.0   Recent Labs  Lab 10/18/19 1707  LIPASE 18   No results for input(s): AMMONIA in the last 168 hours. Coagulation Profile: No results for input(s): INR, PROTIME in the last 168 hours. Cardiac Enzymes: No results for input(s): CKTOTAL, CKMB, CKMBINDEX, TROPONINI in the last 168 hours. BNP (last 3 results) No results for input(s): PROBNP in the last 8760 hours. HbA1C: No results for input(s): HGBA1C in the last 72 hours. CBG: Recent Labs  Lab 10/19/19 1057 10/19/19 1216 10/19/19 1737 10/19/19 2145 10/20/19 0731  GLUCAP 117* 144* 95 94 153*   Lipid Profile: No results for input(s): CHOL, HDL, LDLCALC, TRIG, CHOLHDL, LDLDIRECT in the last 72 hours. Thyroid Function Tests: No results for input(s): TSH, T4TOTAL, FREET4,  T3FREE, THYROIDAB in the last 72 hours. Anemia Panel: No results for input(s): VITAMINB12, FOLATE, FERRITIN, TIBC, IRON, RETICCTPCT in the last 72 hours. Sepsis Labs: Recent Labs  Lab 10/18/19 1831 10/19/19 0244  LATICACIDVEN 5.7* 4.6*    Recent Results (from the past 240 hour(s))  Respiratory Panel by RT PCR (Flu A&B, Covid) - Nasopharyngeal Swab     Status: None   Collection Time: 10/18/19  6:31 PM   Specimen: Nasopharyngeal Swab  Result Value Ref Range Status   SARS Coronavirus 2 by RT PCR NEGATIVE NEGATIVE Final    Comment: (NOTE) SARS-CoV-2 target nucleic acids are NOT DETECTED. The SARS-CoV-2 RNA is generally detectable in upper respiratoy specimens during the acute phase of infection. The lowest concentration of SARS-CoV-2 viral copies this assay can detect is 131 copies/mL. A negative result does not preclude SARS-Cov-2 infection and should not be used as the sole basis for treatment or other patient management decisions. A negative result may occur with  improper specimen collection/handling, submission of specimen other than  nasopharyngeal swab, presence of viral mutation(s) within the areas targeted by this assay, and inadequate number of viral copies (<131 copies/mL). A negative result must be combined with clinical observations, patient history, and epidemiological information. The expected result is Negative. Fact Sheet for Patients:  https://www.moore.com/ Fact Sheet for Healthcare Providers:  https://www.young.biz/ This test is not yet ap proved or cleared by the [place] FDA and  has been authorized for detection and/or diagnosis of SARS-CoV-2 by FDA under an Emergency Use Authorization (EUA). This EUA will remain  in effect (meaning this test can be used) for the duration of the COVID-19 declaration under Section 564(b)(1) of the Act, 21 U.S.C. section 360bbb-3(b)(1), unless the authorization is terminated  or revoked sooner.    Influenza A by PCR NEGATIVE NEGATIVE Final   Influenza B by PCR NEGATIVE NEGATIVE Final    Comment: (NOTE) The Xpert Xpress SARS-CoV-2/FLU/RSV assay is intended as an aid in  the diagnosis of influenza from Nasopharyngeal swab specimens and  should not be used as a sole basis for treatment. Nasal washings and  aspirates are unacceptable for Xpert Xpress SARS-CoV-2/FLU/RSV  testing. Fact Sheet for Patients: https://www.moore.com/ Fact Sheet for Healthcare Providers: https://www.young.biz/ This test is not yet approved or cleared by the [place] FDA and  has been authorized for detection and/or diagnosis of SARS-CoV-2 by  FDA under an Emergency Use Authorization (EUA). This EUA will remain  in effect (meaning this test can be used) for the duration of the  Covid-19 declaration under Section 564(b)(1) of the Act, 21  U.S.C. section 360bbb-3(b)(1), unless the authorization is  terminated or revoked. Performed at [facility], [address], [place], [place] [address]       Imaging Studies   DG Abd 1 View  Result Date: 10/19/2019 CLINICAL DATA:  Right flank pain. EXAM: ABDOMEN - 1 VIEW COMPARISON:  None. FINDINGS: The bowel gas pattern is normal. A moderate amount of stool is seen within the ascending and transverse colon. No radio-opaque calculi or other significant radiographic abnormality are seen. Multiple radiopaque surgical coils are seen overlying the left hemipelvis. IMPRESSION: Negative. Electronically Signed   By: Aram Candela M.D.   On: 10/19/2019 20:05   CT Renal Stone Study  Result Date: 10/18/2019 CLINICAL DATA:  59 year old male with flank pain. Concern for kidney stone. EXAM: CT ABDOMEN AND PELVIS WITHOUT CONTRAST TECHNIQUE: Multidetector CT imaging of the abdomen and pelvis was performed following the standard protocol without IV contrast. COMPARISON:  CT abdomen pelvis dated 10/01/2013.  FINDINGS: Evaluation of this exam is limited in the absence of intravenous contrast. Lower chest: The visualized lung bases are clear. No intra-abdominal free air or free fluid. Hepatobiliary: The liver is unremarkable. No intrahepatic biliary ductal dilatation. The gallbladder is unremarkable. Pancreas: Unremarkable. No pancreatic ductal dilatation or surrounding inflammatory changes. Spleen: Normal in size without focal abnormality. Adrenals/Urinary Tract: The adrenal glands are unremarkable. There is a 3 cm left renal inferior pole cyst. There is a 5 mm stone in the distal right ureter. There is minimal right hydronephrosis. No stone identified within the right kidney. There is no hydronephrosis or nephrolithiasis on the left. The left ureter and urinary bladder appear unremarkable. Stomach/Bowel: There is minimal circumferential thickening of the distal esophagus which may be partly related to underdistention or represent mild esophagitis. Clinical correlation is recommended. There is moderate stool throughout the colon. There is no bowel obstruction or active inflammation. Normal appendix. Vascular/Lymphatic: Minimal aortoiliac atherosclerotic disease. The IVC is unremarkable.  No portal venous gas. There is no adenopathy. Reproductive: The prostate and seminal vesicles are grossly unremarkable. No pelvic mass. Scattered calcification of the peripheral corpus cavernosum of the penis noted, progressed since the prior CT. Other: None Musculoskeletal: Osteopenia with degenerative changes of the spine. No acute osseous pathology. IMPRESSION: 1. A 5 mm distal right ureteral stone with minimal right hydronephrosis. 2. No bowel obstruction. Normal appendix. 3. Mild circumferential thickening of the distal esophagus may be related to underdistention or represent mild esophagitis. Clinical correlation is recommended. 4. Aortic Atherosclerosis (ICD10-I70.0). Electronically Signed   By: Elgie Collard M.D.   On:  10/18/2019 19:51     Medications   Scheduled Meds: . amLODipine  10 mg Oral Daily  . clopidogrel  75 mg Oral Daily  . enoxaparin (LOVENOX) injection  40 mg Subcutaneous Q24H  . famotidine  20 mg Oral Daily  . gabapentin  300 mg Oral BID  . insulin aspart  0-15 Units Subcutaneous TID WC  . insulin aspart  0-5 Units Subcutaneous QHS  . insulin detemir  25 Units Subcutaneous BID  . magic mouthwash  10 mL Oral QID   And  . lidocaine  5 mL Mouth/Throat QID  . nicotine  21 mg Transdermal Daily  . tamsulosin  0.4 mg Oral Daily   Continuous Infusions: . sodium chloride 125 mL/hr at 10/20/19 0244       LOS: 2 days    Time spent: 30 MINUTES    Pennie Banter, DO [facility]   If 7PM-7AM, please contact night-coverage [email or web address] 10/20/2019, 7:33 AM

## 2019-10-20 NOTE — Transfer of Care (Signed)
Immediate Anesthesia Transfer of Care Note  Patient: Samuel [name]  Procedure(s) Performed: CYSTOSCOPY/URETEROSCOPY/HOLMIUM LASER/STENT PLACEMENT (Right )  Patient Location: PACU  Anesthesia Type:General  Level of Consciousness: sedated  Airway & Oxygen Therapy: Patient Spontanous Breathing and Patient connected to face mask oxygen  Post-op Assessment: Report given to RN and Post -op Vital signs reviewed and stable  Post vital signs: Reviewed and stable  Last Vitals:  Vitals Value Taken Time  BP 113/78 10/20/19 1819  Temp 36.4 C 10/20/19 1819  Pulse 111 10/20/19 1823  Resp    SpO2 97 % 10/20/19 1823  Vitals shown include unvalidated device data.  Last Pain:  Vitals:   10/20/19 1545  TempSrc: Oral  PainSc:       Patients Stated Pain Goal: 0 (10/20/19 0224)  Complications: No apparent anesthesia complications

## 2019-10-20 NOTE — Plan of Care (Signed)
  Problem: Coping: Goal: Level of anxiety will decrease Outcome: Completed/Met

## 2019-10-20 NOTE — Progress Notes (Signed)
Initial Nutrition Assessment  DOCUMENTATION CODES:   Not applicable  INTERVENTION:    Glucerna Shake po TID, each supplement provides 220 kcal and 10 grams of protein  MVI daily   NUTRITION DIAGNOSIS:   Increased nutrient needs related to acute illness as evidenced by estimated needs.  GOAL:   Patient will meet greater than or equal to 90% of their needs  MONITOR:   PO intake, Supplement acceptance, Weight trends, Labs, I & O's  REASON FOR ASSESSMENT:   Consult Assessment of nutrition requirement/status  ASSESSMENT:   Patient with PMH significant for CAD s/p stenting, DM, neuropathy, s/p R TMA, essential HTN, and right-sided nephrolithiasis s/p stenting. Presents this admission with R-sided ureterolithiasis.   RD working remotely.  Attempt to call pt, no answer. No meal intake documented at this time. RD to provide supplementation to maximize kcal and protein this admission.   Records indicate pt weighed 93 kg on 1/5 and 89.9 kg this admission (3.3% wt loss in 4 months, insignificant loss for time frame).   Drips: NS @ 125 ml/hr  Medications: SS novolog, levemir Labs: K 3.0 (L) CBG 95-182  Diet Order:   Diet Order            Diet NPO time specified  Diet effective midnight              EDUCATION NEEDS:   Not appropriate for education at this time  Skin:  Skin Assessment: Reviewed RN Assessment  Last BM:  PTA  Height:   Ht Readings from Last 1 Encounters:  10/20/19 5\' 10"  (1.778 m)    Weight:   Wt Readings from Last 1 Encounters:  10/20/19 89.9 kg    BMI:  Body mass index is 28.44 kg/m.  Estimated Nutritional Needs:   Kcal:  2200-2400 kcal  Protein:  110-125 grams  Fluid:  >/= 2.2 L/day   12/20/19 RD, LDN Clinical Nutrition Pager listed in AMION

## 2019-10-21 ENCOUNTER — Inpatient Hospital Stay: Payer: Medicaid Other | Admitting: Anesthesiology

## 2019-10-21 ENCOUNTER — Encounter
Admission: EM | Disposition: A | Discharge status: Other Acute Inpatient Hospital | Payer: Self-pay | Source: Home / Self Care | Attending: Internal Medicine

## 2019-10-21 DIAGNOSIS — T18128A Food in esophagus causing other injury, initial encounter: Secondary | ICD-10-CM

## 2019-10-21 DIAGNOSIS — R1013 Epigastric pain: Secondary | ICD-10-CM

## 2019-10-21 HISTORY — PX: ESOPHAGOGASTRODUODENOSCOPY: SHX5428

## 2019-10-21 LAB — BASIC METABOLIC PANEL
Anion gap: 11 (ref 5–15)
BUN: 21 mg/dL — ABNORMAL HIGH (ref 6–20)
CO2: 25 mmol/L (ref 22–32)
Calcium: 8.3 mg/dL — ABNORMAL LOW (ref 8.9–10.3)
Chloride: 95 mmol/L — ABNORMAL LOW (ref 98–111)
Creatinine, Ser: 1.16 mg/dL (ref 0.61–1.24)
GFR calc Af Amer: >60 mL/min (ref >60)
GFR calc non Af Amer: >60 mL/min (ref >60)
Glucose, Bld: 321 mg/dL — ABNORMAL HIGH (ref 70–99)
Potassium: 4.3 mmol/L (ref 3.5–5.1)
Sodium: 131 mmol/L — ABNORMAL LOW (ref 135–145)

## 2019-10-21 LAB — GLUCOSE, CAPILLARY
Glucose-Capillary: 290 mg/dL — ABNORMAL HIGH (ref 70–99)
Glucose-Capillary: 171 mg/dL — ABNORMAL HIGH (ref 70–99)
Glucose-Capillary: 150 mg/dL — ABNORMAL HIGH (ref 70–99)
Glucose-Capillary: 213 mg/dL — ABNORMAL HIGH (ref 70–99)
Glucose-Capillary: 290 mg/dL — ABNORMAL HIGH (ref 70–99)

## 2019-10-21 LAB — CBC WITH DIFFERENTIAL/PLATELET
Abs Immature Granulocytes: 0.14 10*3/uL — ABNORMAL HIGH (ref 0.00–0.07)
Basophils Absolute: 0 10*3/uL (ref 0.0–0.1)
Basophils Relative: 0 %
Eosinophils Absolute: 0 10*3/uL (ref 0.0–0.5)
Eosinophils Relative: 0 %
HCT: 39 % (ref 39.0–52.0)
Hemoglobin: 14 g/dL (ref 13.0–17.0)
Immature Granulocytes: 1 %
Lymphocytes Relative: 10 %
Lymphs Abs: 1.5 10*3/uL (ref 0.7–4.0)
MCH: 32.3 pg (ref 26.0–34.0)
MCHC: 35.9 g/dL (ref 30.0–36.0)
MCV: 89.9 fL (ref 80.0–100.0)
Monocytes Absolute: 0.8 10*3/uL (ref 0.1–1.0)
Monocytes Relative: 6 %
Neutro Abs: 12 10*3/uL — ABNORMAL HIGH (ref 1.7–7.7)
Neutrophils Relative %: 83 %
Platelets: 319 10*3/uL (ref 150–400)
RBC: 4.34 MIL/uL (ref 4.22–5.81)
RDW: 11.9 % (ref 11.5–15.5)
WBC: 14.4 10*3/uL — ABNORMAL HIGH (ref 4.0–10.5)
nRBC: 0 % (ref 0.0–0.2)

## 2019-10-21 LAB — KOH PREP
KOH Prep: NONE SEEN
Special Requests: NORMAL

## 2019-10-21 LAB — LIPASE, BLOOD: Lipase: 11 U/L (ref 11–51)

## 2019-10-21 LAB — HEMOGLOBIN A1C
Hgb A1c MFr Bld: >15.5 % — ABNORMAL HIGH (ref 4.8–5.6)
Mean Plasma Glucose: >398 mg/dL

## 2019-10-21 SURGERY — EGD (ESOPHAGOGASTRODUODENOSCOPY)
Anesthesia: General

## 2019-10-21 MED ORDER — LIDOCAINE HCL (PF) 2 % IJ SOLN
INTRAMUSCULAR | Status: AC
  Filled 2019-10-21: qty 5

## 2019-10-21 MED ORDER — VASOPRESSIN 20 UNIT/ML IV SOLN
INTRAVENOUS | Status: DC | PRN
  Administered 2019-10-21 (×3): 2 [IU] via INTRAVENOUS

## 2019-10-21 MED ORDER — SODIUM CHLORIDE 0.9 % IV SOLN: INTRAVENOUS | Status: DC

## 2019-10-21 MED ORDER — FENTANYL CITRATE (PF) 100 MCG/2ML IJ SOLN
INTRAMUSCULAR | Status: AC
  Filled 2019-10-21: qty 2

## 2019-10-21 MED ORDER — MORPHINE SULFATE (PF) 2 MG/ML IV SOLN
2.0000 mg | INTRAVENOUS | Status: DC | PRN
  Administered 2019-10-21 – 2019-10-22 (×2): 2 mg via INTRAVENOUS
  Filled 2019-10-21 (×2): qty 1

## 2019-10-21 MED ORDER — FENTANYL CITRATE (PF) 100 MCG/2ML IJ SOLN
INTRAMUSCULAR | Status: DC | PRN
  Administered 2019-10-21 (×2): 50 ug via INTRAVENOUS

## 2019-10-21 MED ORDER — OXYCODONE-ACETAMINOPHEN 5-325 MG PO TABS
2.0000 | ORAL_TABLET | Freq: Four times a day (QID) | ORAL | Status: DC
  Administered 2019-10-21 – 2019-10-26 (×19): 2 via ORAL
  Filled 2019-10-21 (×19): qty 2

## 2019-10-21 MED ORDER — SUCRALFATE 1 GM/10ML PO SUSP
1.0000 g | Freq: Three times a day (TID) | ORAL | Status: DC
  Administered 2019-10-21 – 2019-10-26 (×19): 1 g via ORAL
  Filled 2019-10-21 (×23): qty 10

## 2019-10-21 MED ORDER — ACETAMINOPHEN 10 MG/ML IV SOLN: 1000.0000 mg | Freq: Once | INTRAVENOUS | Status: DC | PRN

## 2019-10-21 MED ORDER — LIVING WELL WITH DIABETES BOOK
Freq: Once | Status: AC
  Filled 2019-10-21: qty 1

## 2019-10-21 MED ORDER — SUCCINYLCHOLINE CHLORIDE 200 MG/10ML IV SOSY
PREFILLED_SYRINGE | INTRAVENOUS | Status: AC
  Filled 2019-10-21: qty 10

## 2019-10-21 MED ORDER — MIDAZOLAM HCL 2 MG/2ML IJ SOLN
INTRAMUSCULAR | Status: AC
  Filled 2019-10-21: qty 2

## 2019-10-21 MED ORDER — METOPROLOL TARTRATE 5 MG/5ML IV SOLN
INTRAVENOUS | Status: AC
  Filled 2019-10-21: qty 5

## 2019-10-21 MED ORDER — VASOPRESSIN 20 UNIT/ML IV SOLN
INTRAVENOUS | Status: AC
  Filled 2019-10-21: qty 1

## 2019-10-21 MED ORDER — IBUPROFEN 400 MG PO TABS
400.0000 mg | ORAL_TABLET | Freq: Three times a day (TID) | ORAL | Status: DC
  Administered 2019-10-21 – 2019-10-26 (×15): 400 mg via ORAL
  Filled 2019-10-21 (×17): qty 1

## 2019-10-21 MED ORDER — OXYCODONE HCL 5 MG/5ML PO SOLN
5.0000 mg | Freq: Once | ORAL | Status: DC | PRN
  Filled 2019-10-21: qty 5

## 2019-10-21 MED ORDER — DEXAMETHASONE SODIUM PHOSPHATE 10 MG/ML IJ SOLN
INTRAMUSCULAR | Status: AC
  Filled 2019-10-21: qty 1

## 2019-10-21 MED ORDER — LORAZEPAM 1 MG PO TABS: 1.0000 mg | ORAL_TABLET | Freq: Once | ORAL | Status: DC

## 2019-10-21 MED ORDER — FENTANYL CITRATE (PF) 100 MCG/2ML IJ SOLN: 25.0000 ug | INTRAMUSCULAR | Status: DC | PRN

## 2019-10-21 MED ORDER — INSULIN ASPART 100 UNIT/ML ~~LOC~~ SOLN
SUBCUTANEOUS | Status: AC
  Administered 2019-10-21: 16:00:00 2 [IU] via SUBCUTANEOUS
  Filled 2019-10-21: qty 1

## 2019-10-21 MED ORDER — PROPOFOL 10 MG/ML IV BOLUS
INTRAVENOUS | Status: AC
  Filled 2019-10-21: qty 40

## 2019-10-21 MED ORDER — EPHEDRINE SULFATE 50 MG/ML IJ SOLN
INTRAMUSCULAR | Status: DC | PRN
  Administered 2019-10-21: 10 mg via INTRAVENOUS

## 2019-10-21 MED ORDER — ONDANSETRON HCL 4 MG/2ML IJ SOLN: 4.0000 mg | Freq: Once | INTRAMUSCULAR | Status: DC | PRN

## 2019-10-21 MED ORDER — PHENYLEPHRINE HCL (PRESSORS) 10 MG/ML IV SOLN
INTRAVENOUS | Status: DC | PRN
  Administered 2019-10-21: 100 ug via INTRAVENOUS
  Administered 2019-10-21: 250 ug via INTRAVENOUS
  Administered 2019-10-21 (×2): 200 ug via INTRAVENOUS

## 2019-10-21 MED ORDER — MIDAZOLAM HCL 2 MG/2ML IJ SOLN
INTRAMUSCULAR | Status: DC | PRN
  Administered 2019-10-21: 2 mg via INTRAVENOUS

## 2019-10-21 MED ORDER — ONDANSETRON HCL 4 MG/2ML IJ SOLN
INTRAMUSCULAR | Status: AC
  Filled 2019-10-21: qty 2

## 2019-10-21 MED ORDER — PANTOPRAZOLE SODIUM 40 MG IV SOLR
40.0000 mg | Freq: Two times a day (BID) | INTRAVENOUS | Status: DC
  Administered 2019-10-21 – 2019-10-23 (×6): 40 mg via INTRAVENOUS
  Filled 2019-10-21 (×7): qty 40

## 2019-10-21 MED ORDER — ONDANSETRON HCL 4 MG/2ML IJ SOLN
INTRAMUSCULAR | Status: DC | PRN
  Administered 2019-10-21: 4 mg via INTRAVENOUS

## 2019-10-21 MED ORDER — DEXAMETHASONE SODIUM PHOSPHATE 10 MG/ML IJ SOLN
INTRAMUSCULAR | Status: DC | PRN
  Administered 2019-10-21: 5 mg via INTRAVENOUS

## 2019-10-21 MED ORDER — MAGNESIUM CITRATE PO SOLN
1.0000 | Freq: Once | ORAL | Status: AC | PRN
  Administered 2019-10-22: 11:00:00 1 via ORAL
  Filled 2019-10-21 (×2): qty 296

## 2019-10-21 MED ORDER — SUCCINYLCHOLINE CHLORIDE 20 MG/ML IJ SOLN
INTRAMUSCULAR | Status: DC | PRN
  Administered 2019-10-21: 120 mg via INTRAVENOUS

## 2019-10-21 MED ORDER — EPHEDRINE 5 MG/ML INJ
INTRAVENOUS | Status: AC
  Filled 2019-10-21: qty 10

## 2019-10-21 MED ORDER — LIDOCAINE HCL (CARDIAC) PF 100 MG/5ML IV SOSY
PREFILLED_SYRINGE | INTRAVENOUS | Status: DC | PRN
  Administered 2019-10-21: 100 mg via INTRAVENOUS

## 2019-10-21 MED ORDER — INSULIN ASPART 100 UNIT/ML ~~LOC~~ SOLN: 2.0000 [IU] | Freq: Once | SUBCUTANEOUS | Status: AC

## 2019-10-21 MED ORDER — PROPOFOL 10 MG/ML IV BOLUS
INTRAVENOUS | Status: DC | PRN
  Administered 2019-10-21: 180 mg via INTRAVENOUS

## 2019-10-21 MED ORDER — OXYCODONE HCL 5 MG PO TABS
5.0000 mg | ORAL_TABLET | Freq: Once | ORAL | Status: DC | PRN
  Filled 2019-10-21: qty 1

## 2019-10-21 NOTE — Transfer of Care (Addendum)
Immediate Anesthesia Transfer of Care Note  Patient: Connery Shiffler  Procedure(s) Performed: ESOPHAGOGASTRODUODENOSCOPY (EGD) (N/A )  Patient Location: PACU  Anesthesia Type:General  Level of Consciousness: drowsy  Airway & Oxygen Therapy: Patient Spontanous Breathing and Patient connected to face mask oxygen  Post-op Assessment: Report given to RN and Post -op Vital signs reviewed and stable  Post vital signs: Reviewed and stable  Last Vitals:  Vitals Value Taken Time  BP 167/97   Temp    Pulse 100   Resp 16   SpO2 100%     Last Pain:  Vitals:   10/21/19 1418  TempSrc: Temporal  PainSc: 8       Patients Stated Pain Goal: 0 (10/21/19 0406)  Complications: No apparent anesthesia complications

## 2019-10-21 NOTE — Consult Note (Signed)
Samuel Darby, MD [address]  [place]  [place], [place] [address]  Main: [phone]  Fax: [phone] Pager: [phone]   Consultation  Referring Provider:     No ref. provider found Primary Care Physician:  [facility] Primary Gastroenterologist: Althia Forts       Reason for Consultation:     Concern for food impaction, epigastric pain  Date of Admission:  10/18/2019 Date of Consultation:  10/21/2019         HPI:   Samuel [name] is a 59 y.o. male with history of coronary disease s/p PCI in 2019 on aspirin and Plavix, uncontrolled type 2 diabetes on insulin, noncompliance, hypertension, history of right-sided nephrolithiasis s/p stent placement in the past.  Patient recently relocated to [facility].  He was originally admitted on 4/9 secondary to symptomatic right-sided nephrolithiasis with 5 mm distal ureteral stone confirmed on imaging as well as hyperglycemia with blood sugars in 900s, AKI.  Patient underwent cystoscopy, ureteroscopy, laser lithotripsy with basket extraction of the stone fragment, right ureteral stent placement yesterday by urology.  Patient reports that he has been not been able to tolerate p.o. well in the last few days.  This morning, he had scrambled eggs with oatmeal and did not tolerate after few bites.  He had hamburger for lunch, felt severe epigastric discomfort after first bite, felt it got stuck in his lower chest.  He denies chest pain, shortness of breath.  He reports having had an upper endoscopy several years ago, no known stricture.  He denies dysphagia prior to this admission.  He is on ibuprofen 400 mg 3 times a day, along with Pepcid during this admission.  Patient denies alcohol use.  His leukocytosis is improving since admission, no evidence of anemia.  AKI is improving, no evidence of metabolic acidosis.  His blood glucose is 321 this morning. His last dose of Plavix was today at 8:30 AM Normal serum lactic acid  on admission, normal serum lipase yesterday EKG revealed sinus tachycardia.  Cardiac markers were not performed during this admission Patient is tearful about his ongoing pain and reported about several admissions in the past for several different problems  NSAIDs: Ibuprofen  Antiplts/Anticoagulants/Anti thrombotics: Aspirin and Plavix for history of coronary artery disease  GI Procedures: Denies having had a colonoscopy Upper endoscopy several years ago Denies family history of GI malignancy  Past Medical History:  Diagnosis Date  . Diabetes mellitus without complication ([place])   . Hypertension   . UTI (urinary tract infection)     Past Surgical History:  Procedure Laterality Date  . CORONARY ANGIOPLASTY WITH STENT PLACEMENT    . CYSTOSCOPY/URETEROSCOPY/HOLMIUM LASER/STENT PLACEMENT Right 10/20/2019   Procedure: CYSTOSCOPY/URETEROSCOPY/HOLMIUM LASER/STENT PLACEMENT;  Surgeon: Billey Co, MD;  Location: [facility] ORS;  Service: Urology;  Laterality: Right;  . FOOT AMPUTATION    . HERNIA REPAIR    . INCISION AND DRAINAGE PERIRECTAL ABSCESS N/A 09/20/2019   Procedure: IRRIGATION AND DEBRIDEMENT PERIRECTAL ABSCESS;  Surgeon: Herbert Pun, MD;  Location: [facility] ORS;  Service: General;  Laterality: N/A;    Prior to Admission medications   Medication Sig Start Date End Date Taking? Authorizing Provider  amLODipine (NORVASC) 10 MG tablet Take 10 mg by mouth daily.   Yes [provider]  aspirin EC 81 MG tablet Take 81 mg by mouth daily.   Yes [provider]  clopidogrel (PLAVIX) 75 MG tablet Take 1 tablet (75 mg total) by mouth daily. 07/16/19  Yes Triplett, Cari B, FNP  famotidine (PEPCID) 20 MG tablet Take 1 tablet (20 mg total) by mouth daily. 07/16/19 07/15/20 Yes Triplett, Cari B, FNP  gabapentin (NEURONTIN) 300 MG capsule Take 300 mg by mouth 2 (two) times daily.   Yes [provider]  hydrochlorothiazide (HYDRODIURIL) 25 MG tablet Take 25 mg by mouth  daily.   Yes [provider]  insulin detemir (LEVEMIR) 100 UNIT/ML injection Inject 25 Units into the skin 2 (two) times daily.    Yes [provider]    Current Facility-Administered Medications:  .  0.9 %  sodium chloride infusion, , Intravenous, Continuous, Vanga, Tally Due, MD, Last Rate: 20 mL/hr at 10/21/19 1426, New Bag at 10/21/19 1426 .  0.9 %  sodium chloride infusion, , Intravenous, Continuous, Vanga, Tally Due, MD .  Doug Sou Hold] acetaminophen (TYLENOL) tablet 650 mg, 650 mg, Oral, Q6H PRN, Billey Co, MD .  Doug Sou Hold] amLODipine (NORVASC) tablet 10 mg, 10 mg, Oral, Daily, Billey Co, MD, 10 mg at 10/21/19 0829 .  [MAR Hold] clopidogrel (PLAVIX) tablet 75 mg, 75 mg, Oral, Daily, Billey Co, MD, 75 mg at 10/21/19 0829 .  [MAR Hold] dextrose 50 % solution 0-50 mL, 0-50 mL, Intravenous, PRN, Billey Co, MD .  Doug Sou Hold] enoxaparin (LOVENOX) injection 40 mg, 40 mg, Subcutaneous, Q24H, Billey Co, MD, 40 mg at 10/21/19 0350 .  [MAR Hold] feeding supplement (GLUCERNA SHAKE) (GLUCERNA SHAKE) liquid 237 mL, 237 mL, Oral, TID BM, Billey Co, MD, 237 mL at 10/21/19 0830 .  [MAR Hold] gabapentin (NEURONTIN) capsule 300 mg, 300 mg, Oral, BID, Billey Co, MD, 300 mg at 10/21/19 0829 .  [MAR Hold] hydrALAZINE (APRESOLINE) injection 10 mg, 10 mg, Intravenous, Q4H PRN, Billey Co, MD, 10 mg at 10/20/19 1501 .  [MAR Hold] ibuprofen (ADVIL) tablet 400 mg, 400 mg, Oral, TID WC, Griffith, Kelly A, DO, 400 mg at 10/21/19 0830 .  [MAR Hold] insulin aspart (novoLOG) injection 0-15 Units, 0-15 Units, Subcutaneous, TID WC, Billey Co, MD, 8 Units at 10/21/19 0830 .  [MAR Hold] insulin aspart (novoLOG) injection 0-5 Units, 0-5 Units, Subcutaneous, QHS, Sninsky, Herbert Seta, MD .  Doug Sou Hold] insulin detemir (LEVEMIR) injection 10 Units, 10 Units, Subcutaneous, Once, Sninsky, Herbert Seta, MD .  Doug Sou Hold] insulin detemir (LEVEMIR) injection 25  Units, 25 Units, Subcutaneous, BID, Billey Co, MD, 25 Units at 10/21/19 1057 .  [MAR Hold] magic mouthwash, 10 mL, Oral, QID, 10 mL at 10/21/19 0834 **AND** [MAR Hold] lidocaine (XYLOCAINE) 2 % viscous mouth solution 5 mL, 5 mL, Mouth/Throat, QID, Sninsky, Herbert Seta, MD, 5 mL at 10/21/19 0834 .  [MAR Hold] LORazepam (ATIVAN) tablet 1 mg, 1 mg, Oral, Once, Ezekiel Slocumb, DO .  [MAR Hold] magnesium citrate solution 1 Bottle, 1 Bottle, Oral, Once PRN, Ezekiel Slocumb, DO .  [MAR Hold] morphine 2 MG/ML injection 2 mg, 2 mg, Intravenous, Q4H PRN, Nicole Kindred A, DO, 2 mg at 10/21/19 1217 .  [MAR Hold] multivitamin with minerals tablet 1 tablet, 1 tablet, Oral, Daily, Billey Co, MD, 1 tablet at 10/21/19 0829 .  [MAR Hold] nicotine (NICODERM CQ - dosed in mg/24 hours) patch 21 mg, 21 mg, Transdermal, Daily, Billey Co, MD, 21 mg at 10/21/19 4917 .  [MAR Hold] ondansetron (ZOFRAN) injection 4 mg, 4 mg, Intravenous, Q6H PRN, Billey Co, MD, 4 mg at 10/20/19 1535 .  [MAR Hold] oxyCODONE-acetaminophen (PERCOCET/ROXICET)  5-325 MG per tablet 2 tablet, 2 tablet, Oral, Q6H, Nicole Kindred A, DO, 2 tablet at 10/21/19 0828 .  [MAR Hold] pantoprazole (PROTONIX) injection 40 mg, 40 mg, Intravenous, Q12H, Vanga, Tally Due, MD, 40 mg at 10/21/19 1351 .  [MAR Hold] tamsulosin (FLOMAX) capsule 0.4 mg, 0.4 mg, Oral, Daily, Nickolas Madrid C, MD, 0.4 mg at 10/21/19 0830 .  [MAR Hold] traMADol (ULTRAM) tablet 100 mg, 100 mg, Oral, Q6H PRN, Billey Co, MD  History reviewed. No pertinent family history.   Social History   Tobacco Use  . Smoking status: Former Research scientist (life sciences)  . Smokeless tobacco: Never Used  Substance Use Topics  . Alcohol use: Not Currently  . Drug use: Not on file    Allergies as of 10/18/2019  . (No Known Allergies)    Review of Systems:    All systems reviewed and negative except where noted in HPI.   Physical Exam:  Vital signs in last 24 hours: Temp:   [97.6 F (36.4 C)-99 F (37.2 C)] 97.8 F (36.6 C) (04/12 1418) Pulse Rate:  [108-120] 113 (04/12 1418) Resp:  [15-19] 18 (04/12 1418) BP: (112-190)/(71-105) 140/90 (04/12 1418) SpO2:  [93 %-99 %] 98 % (04/12 1418) Last BM Date: (5-6 days ago per pt) General: Sad, in mild distress due to pain Head:  Normocephalic and atraumatic. Eyes:   No icterus.   Conjunctiva pink. PERRLA. Ears:  Normal auditory acuity. Neck:  Supple; no masses or thyroidomegaly Lungs: Respirations even and unlabored. Lungs clear to auscultation bilaterally.   No wheezes, crackles, or rhonchi.  Heart: Tachycardia, regular rhythm;  Without murmur, clicks, rubs or gallops Abdomen:  Soft, nondistended, mild epigastric tenderness. Normal bowel sounds. No appreciable masses or hepatomegaly.  No rebound or guarding.  Rectal:  Not performed. Msk:  Symmetrical without gross deformities.  Strength normal Extremities:  Without edema, cyanosis or clubbing. Neurologic:  Alert and oriented x3;  grossly normal neurologically. Skin:  Intact without significant lesions or rashes. Psych:  Alert and cooperative  LAB RESULTS: CBC Latest Ref Rng & Units 10/21/2019 10/20/2019 10/18/2019  WBC 4.0 - 10.5 K/uL 14.4(H) 17.8(H) 18.6(H)  Hemoglobin 13.0 - 17.0 g/dL 14.0 13.7 15.9  Hematocrit 39.0 - 52.0 % 39.0 40.3 45.4  Platelets 150 - 400 K/uL 319 287 374    BMET BMP Latest Ref Rng & Units 10/21/2019 10/20/2019 10/19/2019  Glucose 70 - 99 mg/dL 321(H) 156(H) 190(H)  BUN 6 - 20 mg/dL 21(H) 17 24(H)  Creatinine 0.61 - 1.24 mg/dL 1.16 0.97 1.23  Sodium 135 - 145 mmol/L 131(L) 137 137  Potassium 3.5 - 5.1 mmol/L 4.3 3.0(L) 3.3(L)  Chloride 98 - 111 mmol/L 95(L) 99 98  CO2 22 - 32 mmol/L '25 29 29  ' Calcium 8.9 - 10.3 mg/dL 8.3(L) 8.3(L) 8.5(L)    LFT Hepatic Function Latest Ref Rng & Units 10/18/2019 09/20/2019 10/01/2013  Total Protein 6.5 - 8.1 g/dL 8.3(H) 7.5 7.3  Albumin 3.5 - 5.0 g/dL 4.0 3.8 3.3(L)  AST 15 - 41 U/L 22 13(L) 14(L)   ALT 0 - 44 U/L '16 14 23  ' Alk Phosphatase 38 - 126 U/L 143(H) 124 129(H)  Total Bilirubin 0.3 - 1.2 mg/dL 1.5(H) 0.3 0.4     STUDIES: DG Abd 1 View  Result Date: 10/19/2019 CLINICAL DATA:  Right flank pain. EXAM: ABDOMEN - 1 VIEW COMPARISON:  None. FINDINGS: The bowel gas pattern is normal. A moderate amount of stool is seen within the ascending and transverse colon. No radio-opaque  calculi or other significant radiographic abnormality are seen. Multiple radiopaque surgical coils are seen overlying the left hemipelvis. IMPRESSION: Negative. Electronically Signed   By: Virgina Norfolk M.D.   On: 10/19/2019 20:05   DG OR UROLOGY CYSTO IMAGE ([facility] ONLY)  Result Date: 10/20/2019 There is no interpretation for this exam.  This order is for images obtained during a surgical procedure.  Please See "Surgeries" Tab for more information regarding the procedure.      Impression / Plan:   Samuel [name] is a 59 y.o. male with poorly controlled diabetes, coronary artery disease s/p PCI, nephrolithiasis s/p stent who is admitted with right flank pain, nausea and vomiting secondary to distal ureteral stone s/p lithotripsy and stent placement on 4/11.  GI has been consulted today due to severe epigastric pain/substernal pain after patient ate hamburger for lunch with concern for food impaction.   Recommend urgent upper endoscopy to evaluate for food impaction Patient's last dose of Plavix was this morning, therefore will not perform any dilation of stricture if found Switch to Protonix 40 mg IV twice daily Minimize NSAID use Tight control of diabetes Further recommendations after upper endoscopy  I have discussed alternative options, risks & benefits,  which include, but are not limited to, bleeding, infection, perforation,respiratory complication & drug reaction.  The patient agrees with this plan & written consent will be obtained.     Thank you for involving me in the care of this patient.       LOS: 3 days   Sherri Sear, MD  10/21/2019, 2:38 PM   Note: This dictation was prepared with Dragon dictation along with smaller phrase technology. Any transcriptional errors that result from this process are unintentional.

## 2019-10-21 NOTE — Anesthesia Preprocedure Evaluation (Signed)
Anesthesia Evaluation  Patient identified by MRN, date of birth, ID band Patient awake    Reviewed: Allergy & Precautions, NPO status , Patient's Chart, lab work & pertinent test results  History of Anesthesia Complications Negative for: history of anesthetic complications  Airway Mallampati: II  TM Distance: >3 FB     Dental  (+) Poor Dentition, Missing, Dental Advidsory Given   Pulmonary neg pulmonary ROS, Patient abstained from smoking., former smoker,    Pulmonary exam normal breath sounds clear to auscultation       Cardiovascular Exercise Tolerance: Good hypertension, (-) angina+ CAD and + Cardiac Stents  (-) Past MI Normal cardiovascular exam(-) dysrhythmias (-) Valvular Problems/Murmurs Rhythm:Regular Rate:Normal - Systolic murmurs    Neuro/Psych negative neurological ROS  negative psych ROS   GI/Hepatic Neg liver ROS, GERD  ,  Endo/Other  diabetes  Renal/GU negative Renal ROS  negative genitourinary   Musculoskeletal negative musculoskeletal ROS (+)   Abdominal Normal abdominal exam  (+)   Peds negative pediatric ROS (+)  Hematology negative hematology ROS (+)   Anesthesia Other Findings Past Medical History: No date: Diabetes mellitus without complication ([facility]) No date: Hypertension No date: UTI (urinary tract infection)  Reproductive/Obstetrics                             Anesthesia Physical  Anesthesia Plan  ASA: II and emergent  Anesthesia Plan: General   Post-op Pain Management:    Induction: Intravenous, Rapid sequence and Cricoid pressure planned  PONV Risk Score and Plan: 2 and Ondansetron, Dexamethasone and Treatment may vary due to age or medical condition  Airway Management Planned: Oral ETT  Additional Equipment: None  Intra-op Plan:   Post-operative Plan: Extubation in OR  Informed Consent: I have reviewed the patients History and Physical, chart,  labs and discussed the procedure including the risks, benefits and alternatives for the proposed anesthesia with the patient or authorized representative who has indicated his/her understanding and acceptance.     Dental advisory given  Plan Discussed with: CRNA and Surgeon  Anesthesia Plan Comments: (Discussed risks of anesthesia with patient, including PONV, sore throat, lip/dental damage, aspiration. Rare risks discussed as well, such as cardiorespiratory and neurological sequelae. Patient understands.)        Anesthesia Quick Evaluation

## 2019-10-21 NOTE — Plan of Care (Signed)
Pt had EGD today.  C/o pain when swallowing. Ate few bites at dinner. Carafate, magic mouthwash and lidocaine given with some improvement.

## 2019-10-21 NOTE — Op Note (Signed)
[facility] Gastroenterology Patient Name: Samuel [name] Procedure Date: 10/21/2019 2:20 PM MRN: [id number] Account #: [id number] Date of Birth: 1961-01-06 Admit Type: Inpatient Age: 59 Room: [facility] ENDO ROOM 4 Gender: Male Note Status: Finalized Procedure:             Upper GI endoscopy Indications:           Epigastric abdominal pain, Foreign body in the                         esophagus Providers:             Toney Reil MD, MD Referring MD:          [facility], MD (Referring MD) Medicines:             General Anesthesia Complications:         No immediate complications. Estimated blood loss: None. Procedure:             Pre-Anesthesia Assessment:                        - Prior to the procedure, a History and Physical was                         performed, and patient medications and allergies were                         reviewed. The patient is competent. The risks and                         benefits of the procedure and the sedation options and                         risks were discussed with the patient. All questions                         were answered and informed consent was obtained.                         Patient identification and proposed procedure were                         verified by the physician, the nurse, the                         anesthesiologist, the anesthetist and the technician                         in the pre-procedure area in the procedure room in the                         endoscopy suite. Mental Status Examination: alert and                         oriented. Airway Examination: normal oropharyngeal                         airway and neck mobility. Respiratory Examination:  clear to auscultation. CV Examination: normal.                         Prophylactic Antibiotics: The patient does not require                         prophylactic antibiotics. Prior Anticoagulants:  The                         patient has taken Plavix (clopidogrel), last dose was                         day of procedure. ASA Grade Assessment: III - A                         patient with severe systemic disease. After reviewing                         the risks and benefits, the patient was deemed in                         satisfactory condition to undergo the procedure. The                         anesthesia plan was to use general anesthesia.                         Immediately prior to administration of medications,                         the patient was re-assessed for adequacy to receive                         sedatives. The heart rate, respiratory rate, oxygen                         saturations, blood pressure, adequacy of pulmonary                         ventilation, and response to care were monitored                         throughout the procedure. The physical status of the                         patient was re-assessed after the procedure.                        After obtaining informed consent, the endoscope was                         passed under direct vision. Throughout the procedure,                         the patient's blood pressure, pulse, and oxygen                         saturations were monitored continuously. The Endoscope  was introduced through the mouth, and advanced to the                         second part of duodenum. The upper GI endoscopy was                         accomplished without difficulty. The patient tolerated                         the procedure well. Findings:      Multiple diffuse, diminutive erosions without bleeding were found in the       duodenal bulb.      The second portion of the duodenum was normal.      The entire examined stomach was normal. Biopsies were taken with a cold       forceps for Helicobacter pylori testing.      The cardia and gastric fundus were normal on retroflexion.      LA Grade  D (one or more mucosal breaks involving at least 75% of       esophageal circumference) esophagitis with no bleeding was found in the       lower third of the esophagus.      Diffuse, white plaques were found in the entire esophagus. Cells for       cytology were obtained by brushing. Estimated blood loss: none. Impression:            - Erosive duodenopathy without bleeding.                        - Normal second portion of the duodenum.                        - Normal stomach. Biopsied.                        - LA Grade D reflux, candidiasis and erosive                         esophagitis with no bleeding.                        - Esophageal plaques were found, suspicious for                         candidiasis. Cells for cytology obtained. Recommendation:        - Return patient to hospital ward for ongoing care.                        - Advance diet as tolerated today.                        - Continue present medications.                        - Await pathology results.                        - Follow an antireflux regimen today.                        -  Use Protonix (pantoprazole) 40 mg PO BID for 3                         months. Procedure Code(s):     --- Professional ---                        [phone], Esophagogastroduodenoscopy, flexible,                         transoral; with biopsy, single or multiple Diagnosis Code(s):     --- Professional ---                        K31.89, Other diseases of stomach and duodenum                        K21.00, Gastro-esophageal reflux disease with                         esophagitis, without bleeding                        B37.81, Candidal esophagitis                        K20.80, Other esophagitis without bleeding                        K22.9, Disease of esophagus, unspecified                        R10.13, Epigastric pain                        T18.108A, Unspecified foreign body in esophagus                         causing other injury,  initial encounter CPT copyright 2019 American Medical Association. All rights reserved. The codes documented in this report are preliminary and upon coder review may  be revised to meet current compliance requirements. Dr. Libby Maw Toney Reil MD, MD 10/21/2019 3:21:18 PM This report has been signed electronically. Number of Addenda: 0 Note Initiated On: 10/21/2019 2:20 PM Estimated Blood Loss:  Estimated blood loss: none.      [facility]

## 2019-10-21 NOTE — Progress Notes (Addendum)
Pt c/o throbbing epigastric pain that started right after he swallowed first bite of hamburger.  He said that he almost chocked and that he feels that it still there.  VSS.  HR 110, but was tachycardic prior.  Morphine given.  Pt seems anxious.  Pt wants to see MD.  Dr. Denton Lank made aware and will see pt shortly.

## 2019-10-21 NOTE — Anesthesia Postprocedure Evaluation (Signed)
Anesthesia Post Note  Patient: Samuel [name]  Procedure(s) Performed: ESOPHAGOGASTRODUODENOSCOPY (EGD) (N/A )  Patient location during evaluation: PACU Anesthesia Type: General Level of consciousness: awake and alert and oriented Pain management: pain level controlled Vital Signs Assessment: post-procedure vital signs reviewed and stable Respiratory status: spontaneous breathing Cardiovascular status: blood pressure returned to baseline Anesthetic complications: no     Last Vitals:  Vitals:   10/21/19 1549 10/21/19 1605  BP: 140/73 (!) 141/76  Pulse: (!) 103 98  Resp: 15 10  Temp:  (!) 36.4 C  SpO2: 95% 94%    Last Pain:  Vitals:   10/21/19 1605  TempSrc:   PainSc: 0-No pain                 Makye Radle

## 2019-10-21 NOTE — Progress Notes (Signed)
PROGRESS NOTE    Samuel [name]   UKG:[id number]  DOB: Sep 12, 1960  PCP: [facility]    DOA: 10/18/2019 LOS: 3   Brief Narrative   Samuel [name] is an 59 y.o. male with history of coronary disease status post stent x1 in 2019, diabetes mellitus type 2 on insulin therapy complicated by DM neuropathy, essential hypertension and history of right-sided nephrolithiasis status post stent in the past. Patient is a resident of [place], and relocated to [place] area.  Presented on 4/9 with intractable nausea and vomiting, right flank pain.  In the ED, glucose 900.  Patient had missed some of his insulin since moving.    Imaging studies did reveal right-sided nephrolithiasis with a 47mm distal ureteral stone.  Admitted to hospitalist service for admission and further management.   Assessment & Plan   Principal Problem:   Ureterolithiasis Active Problems:   Hyperglycemic crisis in diabetes mellitus ([place])  Possible esophageal food impaction-occurred when patient swallowed a bite of hamburger for lunch today.  Now with severe epigastric stabbing pain and spasming. --GI consulted to consider EGD   Ureterolithiasis, right-sided -patient with severe right flank pain radiating to the groin.  Urology consulted.  Underwent cystoscopy and right ureteroscopy with laser lithotripsy and right ureteral stent placement on 4/11.  Benign UA, no infection. --Continue IV fluids and Flomax --Pain control per orders --Per urology: Patient to pull stent at home Thursday morning, Bactrim x5 days while stent in place, oxybutynin XL 10 mg daily as needed, continue Flomax x7 days, NSAIDs or narcotics for pain control.  Stable for discharge from urologic standpoint.  Leukocytosis -suspect this is reactive.  Patient is afebrile, UA was negative, no respiratory symptoms. --Check blood cultures --Monitor CBC --Monitor clinically for signs and symptoms of infection  Lactic acidosis -resolved.   Present on admission, most likely due to HHS, but also with leukocytosis, tachycardia.  He's afebrile, UA was negative, no respiratory symptoms.  --check blood cultures --follow lactate trend to normal  Hyperglycemic crisis -resolved.  Patient transitioned off insulin drip and on subcutaneous insulin. --Resume Levemir 25 units twice daily --Sliding scale NovoLog  Insulin-dependent type 2 diabetes complicated by peripheral neuropathy --Insulin as above --Continue gabapentin  Mouth pain, suspect oral thrush  --Magic mouthwash with lidocaine qid  Coronary artery disease, with prior stent.  Stable and no active chest pain.  Continue Plavix.  Patient BMI: Body mass index is 28.44 kg/m.   DVT prophylaxis: Lovenox Diet:  Diet Orders (From admission, onward)    Start     Ordered   10/21/19 1525  Diet Carb Modified Fluid consistency: Thin; Room service appropriate? Yes  Diet effective now    Question Answer Comment  Diet-HS Snack? Nothing   Calorie Level Medium 1600-2000   Fluid consistency: Thin   Room service appropriate? Yes      10/21/19 1524            Code Status: Full Code    Subjective 10/21/19    Patient seen at bedside after lunch today.  He took a bite of hamburger and when he swallowed it had sudden onset of stabbing epigastric/chest pain and states the food got stuck.  He continues to have spasms in that area.  Reports severe anxiety, never had this happen before.  Non other current complaints.  Had groin pain on straining for BM, otherwise that has improved.   Disposition Plan & Communication   Dispo & Barriers: Discharge home tomorrow.  Patient requires  GI evaluation for possible food impaction and unable to be safely discharged home at this time. Coming from: Home Exp d/c date: 4/13 Medically stable for d/c?  No  Family Communication: None at bedside during encounter, patient to update   Consults, Procedures, Significant Events   Consultants:    Urology  Procedures:   None  Antimicrobials:   None    Objective   Vitals:   10/21/19 1549 10/21/19 1605 10/21/19 1642 10/21/19 1648  BP: 140/73 (!) 141/76 140/73   Pulse: (!) 103 98 99   Resp: 15 10  17   Temp:  (!) 97.5 F (36.4 C) 98.5 F (36.9 C)   TempSrc:   Oral   SpO2: 95% 94% 96%   Weight:      Height:        Intake/Output Summary (Last 24 hours) at 10/21/2019 1719 Last data filed at 10/21/2019 1520 Gross per 24 hour  Intake 1050 ml  Output 1400 ml  Net -350 ml   Filed Weights   10/18/19 1652 10/20/19 0040  Weight: 95.3 kg 89.9 kg    Physical Exam:  General exam: awake, alert, appears uncomfortable in pain HEENT: Left-sided tongue with ulcer, posterior oropharynx with few scattered white exudates, moist mucus membranes, hearing grossly normal  Respiratory system: CTAB, normal respiratory effort. Cardiovascular system: normal S1/S2, RRR, no pedal edema.   Gastrointestinal system: Palpable knot/?  Spasm in the subxiphoid epigastric area which is very tender on palpation, voluntary guarding, no rebound tenderness, nondistended Central nervous system: A&O x4. no gross focal neurologic deficits, normal speech Extremities: moves all, no edema, normal tone   Labs   Data Reviewed: I have personally reviewed following labs and imaging studies  CBC: Recent Labs  Lab 10/18/19 1707 10/20/19 0813 10/21/19 0715  WBC 18.6* 17.8* 14.4*  NEUTROABS  --  14.3* 12.0*  HGB 15.9 13.7 14.0  HCT 45.4 40.3 39.0  MCV 91.2 94.4 89.9  PLT 374 287 319   Basic Metabolic Panel: Recent Labs  Lab 10/18/19 1707 10/19/19 0742 10/20/19 0813 10/21/19 0715  NA 134* 137 137 131*  K 4.5 3.3* 3.0* 4.3  CL 86* 98 99 95*  CO2 24 29 29 25   GLUCOSE 972* 190* 156* 321*  BUN 22* 24* 17 21*  CREATININE 1.41* 1.23 0.97 1.16  CALCIUM 9.5 8.5* 8.3* 8.3*   GFR: Estimated Creatinine Clearance: 80.3 mL/min (by C-G formula based on SCr of 1.16 mg/dL). Liver Function  Tests: Recent Labs  Lab 10/18/19 1707  AST 22  ALT 16  ALKPHOS 143*  BILITOT 1.5*  PROT 8.3*  ALBUMIN 4.0   Recent Labs  Lab 10/18/19 1707 10/20/19 0813  LIPASE 18 11   No results for input(s): AMMONIA in the last 168 hours. Coagulation Profile: No results for input(s): INR, PROTIME in the last 168 hours. Cardiac Enzymes: No results for input(s): CKTOTAL, CKMB, CKMBINDEX, TROPONINI in the last 168 hours. BNP (last 3 results) No results for input(s): PROBNP in the last 8760 hours. HbA1C: Recent Labs    10/19/19 0743  HGBA1C >15.5*   CBG: Recent Labs  Lab 10/20/19 1835 10/21/19 0801 10/21/19 1132 10/21/19 1422 10/21/19 1713  GLUCAP 193* 290* 171* 150* 213*   Lipid Profile: No results for input(s): CHOL, HDL, LDLCALC, TRIG, CHOLHDL, LDLDIRECT in the last 72 hours. Thyroid Function Tests: No results for input(s): TSH, T4TOTAL, FREET4, T3FREE, THYROIDAB in the last 72 hours. Anemia Panel: No results for input(s): VITAMINB12, FOLATE, FERRITIN, TIBC, IRON, RETICCTPCT in the  last 72 hours. Sepsis Labs: Recent Labs  Lab 10/18/19 1831 10/19/19 0244 10/20/19 0813 10/20/19 1114  LATICACIDVEN 5.7* 4.6* 0.9 1.2    Recent Results (from the past 240 hour(s))  Respiratory Panel by RT PCR (Flu A&B, Covid) - Nasopharyngeal Swab     Status: None   Collection Time: 10/18/19  6:31 PM   Specimen: Nasopharyngeal Swab  Result Value Ref Range Status   SARS Coronavirus 2 by RT PCR NEGATIVE NEGATIVE Final    Comment: (NOTE) SARS-CoV-2 target nucleic acids are NOT DETECTED. The SARS-CoV-2 RNA is generally detectable in upper respiratoy specimens during the acute phase of infection. The lowest concentration of SARS-CoV-2 viral copies this assay can detect is 131 copies/mL. A negative result does not preclude SARS-Cov-2 infection and should not be used as the sole basis for treatment or other patient management decisions. A negative result may occur with  improper specimen  collection/handling, submission of specimen other than nasopharyngeal swab, presence of viral mutation(s) within the areas targeted by this assay, and inadequate number of viral copies (<131 copies/mL). A negative result must be combined with clinical observations, patient history, and epidemiological information. The expected result is Negative. Fact Sheet for Patients:  https://www.moore.com/ Fact Sheet for Healthcare Providers:  https://www.young.biz/ This test is not yet ap proved or cleared by the [place] FDA and  has been authorized for detection and/or diagnosis of SARS-CoV-2 by FDA under an Emergency Use Authorization (EUA). This EUA will remain  in effect (meaning this test can be used) for the duration of the COVID-19 declaration under Section 564(b)(1) of the Act, 21 U.S.C. section 360bbb-3(b)(1), unless the authorization is terminated or revoked sooner.    Influenza A by PCR NEGATIVE NEGATIVE Final   Influenza B by PCR NEGATIVE NEGATIVE Final    Comment: (NOTE) The Xpert Xpress SARS-CoV-2/FLU/RSV assay is intended as an aid in  the diagnosis of influenza from Nasopharyngeal swab specimens and  should not be used as a sole basis for treatment. Nasal washings and  aspirates are unacceptable for Xpert Xpress SARS-CoV-2/FLU/RSV  testing. Fact Sheet for Patients: https://www.moore.com/ Fact Sheet for Healthcare Providers: https://www.young.biz/ This test is not yet approved or cleared by the [place] FDA and  has been authorized for detection and/or diagnosis of SARS-CoV-2 by  FDA under an Emergency Use Authorization (EUA). This EUA will remain  in effect (meaning this test can be used) for the duration of the  Covid-19 declaration under Section 564(b)(1) of the Act, 21  U.S.C. section 360bbb-3(b)(1), unless the authorization is  terminated or revoked. Performed at [facility],  [address] [place], [place] [address]   CULTURE, BLOOD (ROUTINE X 2) w Reflex to ID Panel     Status: None (Preliminary result)   Collection Time: 10/20/19  8:23 AM   Specimen: BLOOD  Result Value Ref Range Status   Specimen Description BLOOD BLOOD RIGHT WRIST  Final   Special Requests   Final    BOTTLES DRAWN AEROBIC AND ANAEROBIC Blood Culture adequate volume   Culture   Final    NO GROWTH < 24 HOURS Performed at [facility], [address] [place], [place] [address]    Report Status PENDING  Incomplete  CULTURE, BLOOD (ROUTINE X 2) w Reflex to ID Panel     Status: None (Preliminary result)   Collection Time: 10/20/19 11:14 AM   Specimen: BLOOD  Result Value Ref Range Status   Specimen Description BLOOD LEFT ANTECUBITAL  Final  Special Requests   Final    BOTTLES DRAWN AEROBIC AND ANAEROBIC Blood Culture adequate volume   Culture   Final    NO GROWTH < 24 HOURS Performed at [facility], [address] [place], [place] [address]    Report Status PENDING  Incomplete  KOH prep     Status: None   Collection Time: 10/21/19  3:16 PM   Specimen: Esophagus  Result Value Ref Range Status   Specimen Description ESOPHAGUS  Final   Special Requests Normal  Final   KOH Prep   Final    NO YEAST OR FUNGAL ELEMENTS SEEN Performed at [facility], [address] [place], [place] [address]    Report Status 10/21/2019 FINAL  Final      Imaging Studies   DG Abd 1 View  Result Date: 10/19/2019 CLINICAL DATA:  Right flank pain. EXAM: ABDOMEN - 1 VIEW COMPARISON:  None. FINDINGS: The bowel gas pattern is normal. A moderate amount of stool is seen within the ascending and transverse colon. No radio-opaque calculi or other significant radiographic abnormality are seen. Multiple radiopaque surgical coils are seen overlying the left hemipelvis. IMPRESSION: Negative. Electronically Signed   By: Aram Candela M.D.   On: 10/19/2019 20:05   DG OR  UROLOGY CYSTO IMAGE ([facility] ONLY)  Result Date: 10/20/2019 There is no interpretation for this exam.  This order is for images obtained during a surgical procedure.  Please See "Surgeries" Tab for more information regarding the procedure.     Medications   Scheduled Meds: . amLODipine  10 mg Oral Daily  . clopidogrel  75 mg Oral Daily  . enoxaparin (LOVENOX) injection  40 mg Subcutaneous Q24H  . feeding supplement (GLUCERNA SHAKE)  237 mL Oral TID BM  . gabapentin  300 mg Oral BID  . ibuprofen  400 mg Oral TID WC  . insulin aspart  0-15 Units Subcutaneous TID WC  . insulin aspart  0-5 Units Subcutaneous QHS  . insulin detemir  10 Units Subcutaneous Once  . insulin detemir  25 Units Subcutaneous BID  . magic mouthwash  10 mL Oral QID   And  . lidocaine  5 mL Mouth/Throat QID  . LORazepam  1 mg Oral Once  . multivitamin with minerals  1 tablet Oral Daily  . nicotine  21 mg Transdermal Daily  . oxyCODONE-acetaminophen  2 tablet Oral Q6H  . pantoprazole (PROTONIX) IV  40 mg Intravenous Q12H  . sucralfate  1 g Oral TID WC & HS  . tamsulosin  0.4 mg Oral Daily   Continuous Infusions:      LOS: 3 days    Time spent: 30 minutes    Pennie Banter, DO [facility]   If 7PM-7AM, please contact night-coverage [email or web address] 10/21/2019, 5:19 PM

## 2019-10-21 NOTE — Anesthesia Procedure Notes (Addendum)
Procedure Name: Intubation Date/Time: 10/21/2019 2:55 PM Performed by: Lynden Oxford, CRNA Pre-anesthesia Checklist: Patient identified, Emergency Drugs available, Suction available and Patient being monitored Patient Re-evaluated:Patient Re-evaluated prior to induction Oxygen Delivery Method: Circle system utilized Preoxygenation: Pre-oxygenation with 100% oxygen Induction Type: IV induction, Rapid sequence and Cricoid Pressure applied Laryngoscope Size: McGraph and 4 Grade View: Grade I Tube type: Oral Tube size: 7.5 mm Number of attempts: 1 Airway Equipment and Method: Stylet,  Oral airway and Video-laryngoscopy Placement Confirmation: ETT inserted through vocal cords under direct vision,  positive ETCO2 and breath sounds checked- equal and bilateral Secured at: 22 cm Tube secured with: Tape Dental Injury: Teeth and Oropharynx as per pre-operative assessment  Comments: Rapid sequence induction, mask ventilation not attempted.  Cricoid pressure maintained throughout induction

## 2019-10-21 NOTE — Progress Notes (Addendum)
Inpatient Diabetes Program Recommendations  AACE/ADA: New Consensus Statement on Inpatient Glycemic Control (  Target Ranges:  Prepandial:   less than 140 mg/dL      Peak postprandial:   less than 180 mg/dL (1-2 hours)      Critically ill patients:  140 - 180 mg/dL  Results for KEVONTE, VANECEK (MRN [id number]) as of 10/21/2019 08:38  Ref. Range 10/20/2019 07:31 10/20/2019 10:19 10/20/2019 11:28 10/20/2019 18:35 10/21/2019 08:01  Glucose-Capillary Latest Ref Range: 70 - 99 mg/dL 315 (H) 400 (H) 867 (H) 193 (H) 290 (H)    Review of Glycemic Control  Outpatient Diabetes medications: Levemir 25 units BID Current orders for Inpatient glycemic control: Levemir 25 units BID, Novolog 0-15 units TID with meals, Novolog 0-5 units QHS  Inpatient Diabetes Program Recommendations:   Insulin - Basal: In reviewing chart, noted morning Levemir was NOT GIVEN on 10/20/19 (had one time order for Levemir 10 units on 10/20/19 which was charted as patient refused). Fasting glucose 290 mg/dl today. Patient is ordered Levemir 25 units BID.  HgbA1C: A1C in process.  NOTE: In reviewing chart, noted patient does not have any insurance and recently moved from [place] to [place]. Noted patient was seen in Emergency Room on 07/16/19 for medication refill as patient was out of several medications. At that time, noted 70/30 insulin listed on home medication list (currently has Levemir listed on home medication list). Will plan to talk with patient today.  Addendum 10/21/19@14 :20- Went by to talk with patient but patient is currently in Endoscopy. Will attempt to talk with patient again tomorrow.  Thanks, Orlando Penner, RN, MSN, CDE Diabetes Coordinator Inpatient Diabetes Program [phone] (Team Pager from 8am to 5pm)

## 2019-10-21 NOTE — Progress Notes (Signed)
Urology Inpatient Progress Note  Subjective: Samuel [name] is a 59 y.o. comorbid male admitted on 10/18/2019 with right renal colic with nausea and vomiting secondary to 5 mm distal right ureteral stone.  He is POD 1 from cystoscopy and right ureteroscopy with laser lithotripsy and right ureteral stent placement with Dr. Richardo Hanks.  UA on admission was completely benign.   WBC count down today, 14.4; creatinine normal at 1.16.  Patient reports decreased pain and resolution of nausea and vomiting following stone procedure yesterday.  He does report worsened RLQ pain associated with bearing down to have a bowel movement overnight.  He requests milk of magnesia to induce a bowel movement.  Of note, patient reports that his outside medical records may be under the name Samuel [name], not [facility].  Anti-infectives: Anti-infectives (From admission, onward)   Start     Dose/Rate Route Frequency Ordered Stop   10/20/19 1100  ceFAZolin (ANCEF) IVPB 1 g/50 mL premix     1 g 100 mL/hr over 30 Minutes Intravenous  Once 10/20/19 0909 10/20/19 1134      Current Facility-Administered Medications  Medication Dose Route Frequency Provider Last Rate Last Admin  . acetaminophen (TYLENOL) tablet 650 mg  650 mg Oral Q6H PRN [name], MD      . amLODipine (NORVASC) tablet 10 mg  10 mg Oral Daily [name], MD   10 mg at 10/21/19 0829  . clopidogrel (PLAVIX) tablet 75 mg  75 mg Oral Daily [name], MD   75 mg at 10/21/19 0829  . dextrose 50 % solution 0-50 mL  0-50 mL Intravenous PRN Legrand Rams C, MD      . enoxaparin (LOVENOX) injection 40 mg  40 mg Subcutaneous Q24H [name], MD   40 mg at 10/21/19 0350  . famotidine (PEPCID) tablet 20 mg  20 mg Oral Daily [name], MD   20 mg at 10/21/19 2683  . feeding supplement (GLUCERNA SHAKE) (GLUCERNA SHAKE) liquid 237 mL  237 mL Oral TID BM [name], MD   237 mL at 10/21/19 0830  . gabapentin (NEURONTIN) capsule 300 mg   300 mg Oral BID Legrand Rams C, MD   300 mg at 10/21/19 0829  . hydrALAZINE (APRESOLINE) injection 10 mg  10 mg Intravenous Q4H PRN [name], MD   10 mg at 10/20/19 1501  . ibuprofen (ADVIL) tablet 400 mg  400 mg Oral TID WC Esaw Grandchild A, DO   400 mg at 10/21/19 0830  . insulin aspart (novoLOG) injection 0-15 Units  0-15 Units Subcutaneous TID WC [name], MD   8 Units at 10/21/19 0830  . insulin aspart (novoLOG) injection 0-5 Units  0-5 Units Subcutaneous QHS Legrand Rams C, MD      . insulin detemir (LEVEMIR) injection 10 Units  10 Units Subcutaneous Once [name], MD      . insulin detemir (LEVEMIR) injection 25 Units  25 Units Subcutaneous BID [name], MD   25 Units at 10/20/19 2300  . magic mouthwash  10 mL Oral QID [name], MD   10 mL at 10/21/19 0834   And  . lidocaine (XYLOCAINE) 2 % viscous mouth solution 5 mL  5 mL Mouth/Throat QID [name], MD   5 mL at 10/21/19 0834  . living well with diabetes book MISC   Does not apply Once Esaw Grandchild A, DO      . morphine  2 MG/ML injection 2 mg  2 mg Intravenous Q4H PRN Nicole Kindred A, DO      . multivitamin with minerals tablet 1 tablet  1 tablet Oral Daily Billey Co, MD   1 tablet at 10/21/19 0829  . nicotine (NICODERM CQ - dosed in mg/24 hours) patch 21 mg  21 mg Transdermal Daily Billey Co, MD   21 mg at 10/21/19 0838  . ondansetron (ZOFRAN) injection 4 mg  4 mg Intravenous Q6H PRN Billey Co, MD   4 mg at 10/20/19 1535  . oxyCODONE-acetaminophen (PERCOCET/ROXICET) 5-325 MG per tablet 2 tablet  2 tablet Oral Q6H Nicole Kindred A, DO   2 tablet at 10/21/19 0828  . tamsulosin (FLOMAX) capsule 0.4 mg  0.4 mg Oral Daily Billey Co, MD   0.4 mg at 10/21/19 0830  . traMADol (ULTRAM) tablet 100 mg  100 mg Oral Q6H PRN Billey Co, MD       Objective: Vital signs in last 24 hours: Temp:  [97.6 F (36.4 C)-99 F (37.2 C)] 99 F (37.2 C) (04/12  0804) Pulse Rate:  [109-120] 119 (04/12 0804) Resp:  [15-19] 16 (04/12 0804) BP: (112-190)/(71-113) 165/96 (04/12 0804) SpO2:  [93 %-100 %] 99 % (04/12 0804)  Intake/Output from previous day: 04/11 0701 - 04/12 0700 In: 550 [I.V.:500; IV Piggyback:50] Out: 800 [Urine:800] Intake/Output this shift: No intake/output data recorded.  Physical Exam Vitals and nursing note reviewed.  Constitutional:      General: He is not in acute distress.    Appearance: He is not ill-appearing, toxic-appearing or diaphoretic.  HENT:     Head: Normocephalic and atraumatic.  Pulmonary:     Effort: Pulmonary effort is normal. No respiratory distress.  Skin:    General: Skin is warm and dry.  Neurological:     Mental Status: He is alert and oriented to person, place, and time.  Psychiatric:        Mood and Affect: Affect is labile.        Speech: Speech is tangential.    Lab Results:  Recent Labs    10/20/19 0813 10/21/19 0715  WBC 17.8* 14.4*  HGB 13.7 14.0  HCT 40.3 39.0  PLT 287 319   BMET Recent Labs    10/20/19 0813 10/21/19 0715  NA 137 131*  K 3.0* 4.3  CL 99 95*  CO2 29 25  GLUCOSE 156* 321*  BUN 17 21*  CREATININE 0.97 1.16  CALCIUM 8.3* 8.3*   ABG Recent Labs    10/18/19 1833  HCO3 29.9*   Assessment & Plan: 59 year old comorbid male s/p ureteroscopy with laser lithotripsy and right stent placement for management of a 5 mm distal right ureteral stone.  Pain appears to be well controlled with resolution of nausea and vomiting following procedure with worsened stent discomfort secondary to straining to have a bowel movement.  Okay with milk of magnesia as desired to produce a softened BM.  Okay to discharge today from the urologic perspective.  I had a lengthy conversation with the patient today regarding at-home stent removal.  I counseled him to remove the stent at home no sooner than Thursday, 10/24/2018.  I counseled him to grip the tether that he was able to  visualize protruding from his urethral meatus and pull the string to remove the attached stent in its entirety.  Additionally, I counseled him that ureteral stents may cause dysuria, urgency, frequency, and gross hematuria. He expressed understanding.  Carman Ching, PA-C 10/21/2019

## 2019-10-22 DIAGNOSIS — K2210 Ulcer of esophagus without bleeding: Secondary | ICD-10-CM

## 2019-10-22 DIAGNOSIS — K30 Functional dyspepsia: Secondary | ICD-10-CM

## 2019-10-22 LAB — GLUCOSE, CAPILLARY
Glucose-Capillary: 145 mg/dL — ABNORMAL HIGH (ref 70–99)
Glucose-Capillary: 213 mg/dL — ABNORMAL HIGH (ref 70–99)
Glucose-Capillary: 215 mg/dL — ABNORMAL HIGH (ref 70–99)
Glucose-Capillary: 67 mg/dL — ABNORMAL LOW (ref 70–99)
Glucose-Capillary: 92 mg/dL (ref 70–99)
Glucose-Capillary: 111 mg/dL — ABNORMAL HIGH (ref 70–99)
Glucose-Capillary: 51 mg/dL — ABNORMAL LOW (ref 70–99)
Glucose-Capillary: 79 mg/dL (ref 70–99)

## 2019-10-22 LAB — BASIC METABOLIC PANEL
Anion gap: 8 (ref 5–15)
BUN: 20 mg/dL (ref 6–20)
CO2: 30 mmol/L (ref 22–32)
Calcium: 8.4 mg/dL — ABNORMAL LOW (ref 8.9–10.3)
Chloride: 101 mmol/L (ref 98–111)
Creatinine, Ser: 0.95 mg/dL (ref 0.61–1.24)
GFR calc Af Amer: >60 mL/min (ref >60)
GFR calc non Af Amer: >60 mL/min (ref >60)
Glucose, Bld: 208 mg/dL — ABNORMAL HIGH (ref 70–99)
Potassium: 3.5 mmol/L (ref 3.5–5.1)
Sodium: 139 mmol/L (ref 135–145)

## 2019-10-22 LAB — CBC WITH DIFFERENTIAL/PLATELET
Abs Immature Granulocytes: 0.06 10*3/uL (ref 0.00–0.07)
Basophils Absolute: 0 10*3/uL (ref 0.0–0.1)
Basophils Relative: 0 %
Eosinophils Absolute: 0 10*3/uL (ref 0.0–0.5)
Eosinophils Relative: 0 %
HCT: 37.2 % — ABNORMAL LOW (ref 39.0–52.0)
Hemoglobin: 12.7 g/dL — ABNORMAL LOW (ref 13.0–17.0)
Immature Granulocytes: 1 %
Lymphocytes Relative: 18 %
Lymphs Abs: 2.2 10*3/uL (ref 0.7–4.0)
MCH: 32.6 pg (ref 26.0–34.0)
MCHC: 34.1 g/dL (ref 30.0–36.0)
MCV: 95.4 fL (ref 80.0–100.0)
Monocytes Absolute: 0.6 10*3/uL (ref 0.1–1.0)
Monocytes Relative: 5 %
Neutro Abs: 9.3 10*3/uL — ABNORMAL HIGH (ref 1.7–7.7)
Neutrophils Relative %: 76 %
Platelets: 266 10*3/uL (ref 150–400)
RBC: 3.9 MIL/uL — ABNORMAL LOW (ref 4.22–5.81)
RDW: 12 % (ref 11.5–15.5)
WBC: 12.1 10*3/uL — ABNORMAL HIGH (ref 4.0–10.5)
nRBC: 0 % (ref 0.0–0.2)

## 2019-10-22 MED ORDER — LIDOCAINE VISCOUS HCL 2 % MT SOLN
15.0000 mL | OROMUCOSAL | Status: DC | PRN
  Filled 2019-10-22 (×4): qty 15

## 2019-10-22 MED ORDER — POLYETHYLENE GLYCOL 3350 17 G PO PACK
17.0000 g | PACK | Freq: Every day | ORAL | Status: DC
  Administered 2019-10-25: 17 g via ORAL
  Filled 2019-10-22 (×4): qty 1

## 2019-10-22 MED ORDER — DOCUSATE SODIUM 100 MG PO CAPS
100.0000 mg | ORAL_CAPSULE | Freq: Every day | ORAL | Status: DC
  Administered 2019-10-25: 100 mg via ORAL
  Filled 2019-10-22 (×2): qty 1

## 2019-10-22 MED ORDER — LIDOCAINE 5 % EX PTCH
1.0000 | MEDICATED_PATCH | CUTANEOUS | Status: DC
  Administered 2019-10-22 – 2019-10-26 (×5): 1 via TRANSDERMAL
  Filled 2019-10-22 (×6): qty 1
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
## 2019-10-22 NOTE — Progress Notes (Signed)
PROGRESS NOTE    Samuel [name]   ERD:[id number]  DOB: 04-15-1961  PCP: [facility]    DOA: 10/18/2019 LOS: 4   Brief Narrative   Jiovanni Heeter is an 59 y.o. male with history of coronary disease status post stent x1 in 2019, diabetes mellitus type 2 on insulin therapy complicated by DM neuropathy, essential hypertension and history of right-sided nephrolithiasis status post stent in the past. Patient is a resident of [place], and relocated to [place] area.  Presented on 4/9 with intractable nausea and vomiting, right flank pain.  In the ED, glucose 900.  Patient had missed some of his insulin since moving.    Imaging studies did reveal right-sided nephrolithiasis with a 85mm distal ureteral stone.  Admitted to hospitalist service for admission and further management.   Assessment & Plan   Principal Problem:   Ureterolithiasis Active Problems:   Hyperglycemic crisis in diabetes mellitus ([place])  Abdominal wall pain with swelling -concerning for ventral hernia, right of midline abdomen.  Patient reports this area protrudes and becomes very paiful when he bears down.  Reports he has mesh from a prior ventral hernia on the other side. --CT abdomen pelvis  --General surgery consulted --Bowel regimen for constipation  Mood lability, depression, suicidal ideation -since admission, patient has had extreme mood swings ranging from tearful and distressed to agitated and angry.  He was overs heard making a statement about killing himself. --Psychiatry consulted  Possible esophageal food impaction-occurred when patient swallowed a bite of hamburger for lunch today.  Now with severe epigastric stabbing pain and spasming. GI consulted .  EGD done 4/12, showed erosive duodenoscopy without bleeding, LA grade D reflux, candidiasis and erosive esophagitis with nonbleeding esophageal plaques suspicious for candidiasis were biopsied and sent to pathology.  Awaiting path  results  Ureterolithiasis, right-sided -patient with severe right flank pain radiating to the groin.  Urology consulted.  Underwent cystoscopy and right ureteroscopy with laser lithotripsy and right ureteral stent placement on 4/11.  Benign UA, no infection. --Continue IV fluids and Flomax --Pain control per orders --Per urology: Patient to pull stent at home Thursday morning, Bactrim x5 days while stent in place, oxybutynin XL 10 mg daily as needed, continue Flomax x7 days, NSAIDs or narcotics for pain control.  Stable for discharge from urologic standpoint.  Leukocytosis -suspect this is reactive.  Patient is afebrile, UA was negative, no respiratory symptoms. --Check blood cultures --Monitor CBC --Monitor clinically for signs and symptoms of infection  Lactic acidosis -resolved.  Present on admission, most likely due to HHS, but also with leukocytosis, tachycardia.  He's afebrile, UA was negative, no respiratory symptoms.  --check blood cultures --follow lactate trend to normal  Hyperglycemic crisis -resolved.  Patient transitioned off insulin drip and on subcutaneous insulin. --Resume Levemir 25 units twice daily --Sliding scale NovoLog 3 times daily with meals and at bedtime  Insulin-dependent type 2 diabetes complicated by peripheral neuropathy Fairly uncontrolled , A1c 15.5%!!   --Insulin as above --Continue gabapentin  Mouth pain, most likely oral thrush (has history) --Magic mouthwash with lidocaine qid --added viscous lidocaine for in between doses  Coronary artery disease, with prior stent.  Stable and no active chest pain.  Continue Plavix.  Patient BMI: Body mass index is 28.44 kg/m.   DVT prophylaxis: Lovenox Diet:  Diet Orders (From admission, onward)    Start     Ordered   10/21/19 1525  Diet Carb Modified Fluid consistency: Thin; Room service appropriate? Yes  Diet effective now    Question Answer Comment  Diet-HS Snack? Nothing   Calorie Level Medium  1600-2000   Fluid consistency: Thin   Room service appropriate? Yes      10/21/19 1524            Code Status: Full Code    Subjective 10/22/19    Patient seen at bedside morning.  No acute events reported.  Patient appears in distress and is crying throughout the encounter this morning.  He has several complaints including his severe mouth pain, right foot he thinks his chronic wound is open and needs wound care because it has been bleeding recently, abdominal wall pain and protrusion and bearing down to 10 to have a bowel movement.  He is constipated.  He is also upset and states he is scared because he does not remember someone removing the IV from his arm last night.  He also reported " watching TV" with his eyes closed, by which he is having hallucinations.  He thinks this happened after getting morphine.  Denies history of hallucinations also denies use of alcohol or illicit drugs.  Disposition Plan & Communication   Dispo & Barriers: Anticipate discharge home pending resolution of multiple acute issues above.  Primary barrier is likely constipation which is causing his abdominal pain. Coming from: Home Exp d/c date: 4/13 Medically stable for d/c?  No  Family Communication: None at bedside during encounter, patient to update   Consults, Procedures, Significant Events   Consultants:   Urology  Procedures:   None  Antimicrobials:   None    Objective   Vitals:   10/21/19 2325 10/22/19 0801 10/22/19 1038 10/22/19 1622  BP: (!) 157/87 (!) 195/97 (!) 150/85 126/82  Pulse: (!) 109 (!) 101 91 96  Resp: 17 16  20   Temp: 98.7 F (37.1 C) 98.6 F (37 C)  98.5 F (36.9 C)  TempSrc: Oral Oral  Oral  SpO2: 98% 98% 98% 97%  Weight:      Height:        Intake/Output Summary (Last 24 hours) at 10/22/2019 1639 Last data filed at 10/21/2019 1800 Gross per 24 hour  Intake --  Output 150 ml  Net -150 ml   Filed Weights   10/18/19 1652 10/20/19 0040  Weight: 95.3 kg  89.9 kg    Physical Exam:  General exam: awake, alert, appears uncomfortable in pain HEENT: Left-sided tongue with ulcer, posterior oropharynx with few scattered white exudates, moist mucus membranes, hearing grossly normal  Respiratory system: CTAB, normal respiratory effort. Cardiovascular system: normal S1/S2, RRR, no pedal edema.   Gastrointestinal system: Palpable knot/?  Spasm in the subxiphoid epigastric area which is very tender on palpation, voluntary guarding, no rebound tenderness, nondistended Central nervous system: A&O x4. no gross focal neurologic deficits, normal speech Extremities: moves all, no edema, normal tone   Labs   Data Reviewed: I have personally reviewed following labs and imaging studies  CBC: Recent Labs  Lab 10/18/19 1707 10/20/19 0813 10/21/19 0715 10/22/19 0523  WBC 18.6* 17.8* 14.4* 12.1*  NEUTROABS  --  14.3* 12.0* 9.3*  HGB 15.9 13.7 14.0 12.7*  HCT 45.4 40.3 39.0 37.2*  MCV 91.2 94.4 89.9 95.4  PLT 374 287 319 266   Basic Metabolic Panel: Recent Labs  Lab 10/18/19 1707 10/19/19 0742 10/20/19 0813 10/21/19 0715 10/22/19 0523  NA 134* 137 137 131* 139  K 4.5 3.3* 3.0* 4.3 3.5  CL 86* 98 99 95* 101  CO2 24 29 29 25 30   GLUCOSE 972* 190* 156* 321* 208*  BUN 22* 24* 17 21* 20  CREATININE 1.41* 1.23 0.97 1.16 0.95  CALCIUM 9.5 8.5* 8.3* 8.3* 8.4*   GFR: Estimated Creatinine Clearance: 98 mL/min (by C-G formula based on SCr of 0.95 mg/dL). Liver Function Tests: Recent Labs  Lab 10/18/19 1707  AST 22  ALT 16  ALKPHOS 143*  BILITOT 1.5*  PROT 8.3*  ALBUMIN 4.0   Recent Labs  Lab 10/18/19 1707 10/20/19 0813  LIPASE 18 11   No results for input(s): AMMONIA in the last 168 hours. Coagulation Profile: No results for input(s): INR, PROTIME in the last 168 hours. Cardiac Enzymes: No results for input(s): CKTOTAL, CKMB, CKMBINDEX, TROPONINI in the last 168 hours. BNP (last 3 results) No results for input(s): PROBNP in the  last 8760 hours. HbA1C: No results for input(s): HGBA1C in the last 72 hours. CBG: Recent Labs  Lab 10/21/19 1713 10/21/19 2109 10/22/19 0738 10/22/19 1155 10/22/19 1220  GLUCAP 213* 290* 145* 67* 92   Lipid Profile: No results for input(s): CHOL, HDL, LDLCALC, TRIG, CHOLHDL, LDLDIRECT in the last 72 hours. Thyroid Function Tests: No results for input(s): TSH, T4TOTAL, FREET4, T3FREE, THYROIDAB in the last 72 hours. Anemia Panel: No results for input(s): VITAMINB12, FOLATE, FERRITIN, TIBC, IRON, RETICCTPCT in the last 72 hours. Sepsis Labs: Recent Labs  Lab 10/18/19 1831 10/19/19 0244 10/20/19 0813 10/20/19 1114  LATICACIDVEN 5.7* 4.6* 0.9 1.2    Recent Results (from the past 240 hour(s))  Respiratory Panel by RT PCR (Flu A&B, Covid) - Nasopharyngeal Swab     Status: None   Collection Time: 10/18/19  6:31 PM   Specimen: Nasopharyngeal Swab  Result Value Ref Range Status   SARS Coronavirus 2 by RT PCR NEGATIVE NEGATIVE Final    Comment: (NOTE) SARS-CoV-2 target nucleic acids are NOT DETECTED. The SARS-CoV-2 RNA is generally detectable in upper respiratoy specimens during the acute phase of infection. The lowest concentration of SARS-CoV-2 viral copies this assay can detect is 131 copies/mL. A negative result does not preclude SARS-Cov-2 infection and should not be used as the sole basis for treatment or other patient management decisions. A negative result may occur with  improper specimen collection/handling, submission of specimen other than nasopharyngeal swab, presence of viral mutation(s) within the areas targeted by this assay, and inadequate number of viral copies (<131 copies/mL). A negative result must be combined with clinical observations, patient history, and epidemiological information. The expected result is Negative. Fact Sheet for Patients:  12/18/19 Fact Sheet for Healthcare Providers:   https://www.moore.com/ This test is not yet ap proved or cleared by the https://www.young.biz/ FDA and  has been authorized for detection and/or diagnosis of SARS-CoV-2 by FDA under an Emergency Use Authorization (EUA). This EUA will remain  in effect (meaning this test can be used) for the duration of the COVID-19 declaration under Section 564(b)(1) of the Act, 21 U.S.C. section 360bbb-3(b)(1), unless the authorization is terminated or revoked sooner.    Influenza A by PCR NEGATIVE NEGATIVE Final   Influenza B by PCR NEGATIVE NEGATIVE Final    Comment: (NOTE) The Xpert Xpress SARS-CoV-2/FLU/RSV assay is intended as an aid in  the diagnosis of influenza from Nasopharyngeal swab specimens and  should not be used as a sole basis for treatment. Nasal washings and  aspirates are unacceptable for Xpert Xpress SARS-CoV-2/FLU/RSV  testing. Fact Sheet for Patients: [place] Fact Sheet for Healthcare Providers: https://www.moore.com/ This test is  not yet approved or cleared by the [place] and  has been authorized for detection and/or diagnosis of SARS-CoV-2 by  FDA under an Emergency Use Authorization (EUA). This EUA will remain  in effect (meaning this test can be used) for the duration of the  Covid-19 declaration under Section 564(b)(1) of the Act, 21  U.S.C. section 360bbb-3(b)(1), unless the authorization is  terminated or revoked. Performed at [facility], [address] [place], [place] [address]   CULTURE, BLOOD (ROUTINE X 2) w Reflex to ID Panel     Status: None (Preliminary result)   Collection Time: 10/20/19  8:23 AM   Specimen: BLOOD  Result Value Ref Range Status   Specimen Description BLOOD BLOOD RIGHT WRIST  Final   Special Requests   Final    BOTTLES DRAWN AEROBIC AND ANAEROBIC Blood Culture adequate volume   Culture   Final    NO GROWTH 2 DAYS Performed at [facility], [address] [place], [place] [address]    Report Status PENDING  Incomplete  CULTURE, BLOOD (ROUTINE X 2) w Reflex to ID Panel     Status: None (Preliminary result)   Collection Time: 10/20/19 11:14 AM   Specimen: BLOOD  Result Value Ref Range Status   Specimen Description BLOOD LEFT ANTECUBITAL  Final   Special Requests   Final    BOTTLES DRAWN AEROBIC AND ANAEROBIC Blood Culture adequate volume   Culture   Final    NO GROWTH 2 DAYS Performed at [facility], [address] [place], [place] [address]    Report Status PENDING  Incomplete  KOH prep     Status: None   Collection Time: 10/21/19  3:16 PM   Specimen: Esophagus  Result Value Ref Range Status   Specimen Description ESOPHAGUS  Final   Special Requests Normal  Final   KOH Prep   Final    NO YEAST OR FUNGAL ELEMENTS SEEN Performed at [facility], [address], [place], [place] [address]    Report Status 10/21/2019 FINAL  Final      Imaging Studies   No results found.   Medications   Scheduled Meds: . amLODipine  10 mg Oral Daily  . clopidogrel  75 mg Oral Daily  . docusate sodium  100 mg Oral Daily  . enoxaparin (LOVENOX) injection  40 mg Subcutaneous Q24H  . feeding supplement (GLUCERNA SHAKE)  237 mL Oral TID BM  . gabapentin  300 mg Oral BID  . ibuprofen  400 mg Oral TID WC  . insulin aspart  0-15 Units Subcutaneous TID WC  . insulin aspart  0-5 Units Subcutaneous QHS  . insulin detemir  10 Units Subcutaneous Once  . insulin detemir  25 Units Subcutaneous BID  . lidocaine  1 patch Transdermal Q24H  . magic mouthwash  10 mL Oral QID   And  . lidocaine  5 mL Mouth/Throat QID  . LORazepam  1 mg Oral Once  . multivitamin with minerals  1 tablet Oral Daily  . nicotine  21 mg Transdermal Daily  . oxyCODONE-acetaminophen  2 tablet Oral Q6H  . pantoprazole (PROTONIX) IV  40 mg Intravenous Q12H  . polyethylene glycol  17 g Oral Daily  . sucralfate  1 g Oral TID WC & HS  . tamsulosin   0.4 mg Oral Daily   Continuous Infusions:      LOS: 4 days    Time spent: 35 minutes  Pennie BanterKelly A Acelin Ferdig, DO [facility]   If 7PM-7AM, please contact night-coverage [email or web address] 10/22/2019, 4:39 PM

## 2019-10-22 NOTE — TOC Progression Note (Signed)
Transition of Care [facility]) - Progression Note    Patient Details  Name: Samuel [name] MRN: [id number] Date of Birth: 12-Jul-1960  Transition of Care [facility]) CM/SW Contact  Barrie Dunker, RN Phone Number: 10/22/2019, 11:55 AM  Clinical Narrative:     Went in to see the patient to provide Phineas Real phone number he is open with [facility] but not happy with the care he gets, He was on the phone with someone and ignored that I was in the room, I attempted to give him the number but he continued to ignore me being in the room, he was very tearful on the phone and stated to who he was talking to that he wanted to crawl in a hole and die, He said he does not feel love from anyone or anywhere and life is hopeless. I notified the physician of this and she has ordered a Psych eval to be done.       Expected Discharge Plan and Services                                                 Social Determinants of Health (SDOH) Interventions    Readmission Risk Interventions No flowsheet data found.

## 2019-10-22 NOTE — Progress Notes (Signed)
Inpatient Diabetes Program Recommendations  AACE/ADA: New Consensus Statement on Inpatient Glycemic Control   Target Ranges:  Prepandial:   less than 140 mg/dL      Peak postprandial:   less than 180 mg/dL (1-2 hours)      Critically ill patients:  140 - 180 mg/dL   Results for TIQUAN, BOUCH (MRN [id number]) as of 10/22/2019 09:22  Ref. Range 10/21/2019 08:01 10/21/2019 11:32 10/21/2019 14:22 10/21/2019 15:46 10/21/2019 17:13 10/21/2019 21:09 10/22/2019 07:38  Glucose-Capillary Latest Ref Range: 70 - 99 mg/dL 277 (H) 824 (H) 235 (H) 213 (H) 213 (H) 290 (H) 145 (H)  Results for TIRSO, LAWS (MRN [id number]) as of 10/22/2019 09:22  Ref. Range 10/19/2019 07:43  Hemoglobin A1C Latest Ref Range: 4.8 - 5.6 % >15.5 (H)   Review of Glycemic Control  Outpatient Diabetes medications: Levemir 25 units BID Current orders for Inpatient glycemic control: Levemir 25 units BID, Novolog 0-15 units TID with meals, Novolog 0-5 units QHS   Inpatient Diabetes Program Recommendations:   HgbA1C: A1C >15.5% on 10/19/19 indicating an average glucose of >398 mg/dl over the past 2-3 months.   NOTE: In reviewing chart, noted patient does not have any insurance and recently moved from [place] to [place]. Spoke with patient over the phone about diabetes and home regimen for diabetes control. Patient reports that he does not have any insurance and that he went to the [facility] in January (was referred there from the [facility] ED on 07/16/19.  Patient states that he went to the [facility] but feels that he did not get properly assessed as his glucose was not checked at visit and he reports that provider did not touch him to assess him at all.  Patient states that he has to pay money to go to the [facility] to see a provider and he reports that the cost is a struggle for him due to limited funds on social security.  Patient reports that he is taking Levemir 25 units BID and that he is able to get Levemir pens for $10 per month from the  [facility].  Patient reports that he is taking Levemir 25 units BID and his glucose is "all over the place".  Patient reports that he use to take a correction insulin as well but he had to stop taking it because he could not afford it. Asked if he checked with the [facility] about a short acting insulin but he did not ask them about any short acting insulin at his initial visit.  Patient states that when he was going to the health department in [place] that he was taking 70/30 insulin and was able to get food stamps to help with food and that his A1C was down to 8%.  Discussed A1C results (>15.5% on 10/19/19 ) and explained that current A1C indicates an average glucose of greater than 398 mg/dl over the past 2-3 months. Discussed glucose and A1C goals. Discussed importance of checking CBGs and maintaining good CBG control to prevent long-term and short-term complications. Explained how hyperglycemia leads to damage within blood vessels which lead to the common complications seen with uncontrolled diabetes. Stressed to the patient the importance of improving glycemic control to prevent further complications from uncontrolled diabetes. Discussed impact of nutrition, exercise, stress, sickness, and medications on diabetes control.  Patient states that he does not have the money to buy foods that he knows he should be eating and reports that he does not qualify for any food  stamps. Patient very tearful several times during conversation and states that he has limited funds to pay to see a doctor and get medications and food; also has to depend on others for transportation. Informed patient that TOC is consulted and I would let them know about his issues with affording to see a provider, get medications, and food to see if there are any other resources available.   Patient verbalized understanding of information discussed and reports no further questions at this time related to diabetes. Sent secure chat  message to D. Belenda Cruise, RN, CM about patients financial issues and concerns.  Thanks, Barnie Alderman, RN, MSN, CDE Diabetes Coordinator Inpatient Diabetes Program [phone] (Team Pager)

## 2019-10-22 NOTE — Progress Notes (Signed)
Samuel Repress, MD [address]  [address]  [place], [place] [address]  Main: [phone]  Fax: [phone] Pager: [phone]   Subjective: Patient underwent upper endoscopy yesterday, found to have severe erosive esophagitis.  When I interviewed patient today, he was tearful that he is very weak and not able to get out of bed independently.  He is also upset about his social situation, not able to afford medication as outpatient.  He said his blood sugars were under control and his hemoglobin A1c was at 8 in the past when he was following proper diet and taking medication regularly.  He says, after he is discharged he will move to [place], [place] to live with his son.  He says, whenever he tried to get healthy food, his roommates would not allow him to eat.  He says, he is just got approved for Medicare.  He also reports that he has been experiencing delusions today and has been unable to sleep. He is still unable to tolerate solid food due to severe burning in his chest.  He is taking sucralfate, Magic mouthwash as well as pantoprazole   Objective: Vital signs in last 24 hours: Vitals:   10/21/19 2325 10/22/19 0801 10/22/19 1038 10/22/19 1622  BP: (!) 157/87 (!) 195/97 (!) 150/85 126/82  Pulse: (!) 109 (!) 101 91 96  Resp: 17 16  20   Temp: 98.7 F (37.1 C) 98.6 F (37 C)  98.5 F (36.9 C)  TempSrc: Oral Oral  Oral  SpO2: 98% 98% 98% 97%  Weight:      Height:       Weight change:  No intake or output data in the 24 hours ending 10/22/19 1932   Exam: Heart:: Tachycardia, regular rhythm Lungs: normal and clear to auscultation Abdomen: soft, nontender, normal bowel sounds   Lab Results: @LABTEST2 @ Micro Results: Recent Results (from the past 240 hour(s))  Respiratory Panel by RT PCR (Flu A&B, Covid) - Nasopharyngeal Swab     Status: None   Collection Time: 10/18/19  6:31 PM   Specimen: Nasopharyngeal Swab  Result Value Ref Range Status   SARS  Coronavirus 2 by RT PCR NEGATIVE NEGATIVE Final    Comment: (NOTE) SARS-CoV-2 target nucleic acids are NOT DETECTED. The SARS-CoV-2 RNA is generally detectable in upper respiratoy specimens during the acute phase of infection. The lowest concentration of SARS-CoV-2 viral copies this assay can detect is 131 copies/mL. A negative result does not preclude SARS-Cov-2 infection and should not be used as the sole basis for treatment or other patient management decisions. A negative result may occur with  improper specimen collection/handling, submission of specimen other than nasopharyngeal swab, presence of viral mutation(s) within the areas targeted by this assay, and inadequate number of viral copies (<131 copies/mL). A negative result must be combined with clinical observations, patient history, and epidemiological information. The expected result is Negative. Fact Sheet for Patients:  Fact Sheet for Healthcare Providers:  12/18/19 This test is not yet ap proved or cleared by the https://www.moore.com/ FDA and  has been authorized for detection and/or diagnosis of SARS-CoV-2 by FDA under an Emergency Use Authorization (EUA). This EUA will remain  in effect (meaning this test can be used) for the duration of the COVID-19 declaration under Section 564(b)(1) of the Act, 21 U.S.C. section 360bbb-3(b)(1), unless the authorization is terminated or revoked sooner.    Influenza A by PCR NEGATIVE NEGATIVE Final   Influenza B by PCR NEGATIVE  NEGATIVE Final    Comment: (NOTE) The Xpert Xpress SARS-CoV-2/FLU/RSV assay is intended as an aid in  the diagnosis of influenza from Nasopharyngeal swab specimens and  should not be used as a sole basis for treatment. Nasal washings and  aspirates are unacceptable for Xpert Xpress SARS-CoV-2/FLU/RSV  testing. Fact Sheet for Patients: https://www.moore.com/ Fact Sheet  for Healthcare Providers: https://www.young.biz/ This test is not yet approved or cleared by the [place] FDA and  has been authorized for detection and/or diagnosis of SARS-CoV-2 by  FDA under an Emergency Use Authorization (EUA). This EUA will remain  in effect (meaning this test can be used) for the duration of the  Covid-19 declaration under Section 564(b)(1) of the Act, 21  U.S.C. section 360bbb-3(b)(1), unless the authorization is  terminated or revoked. Performed at [facility], [address] [place], [place] [address]   CULTURE, BLOOD (ROUTINE X 2) w Reflex to ID Panel     Status: None (Preliminary result)   Collection Time: 10/20/19  8:23 AM   Specimen: BLOOD  Result Value Ref Range Status   Specimen Description BLOOD BLOOD RIGHT WRIST  Final   Special Requests   Final    BOTTLES DRAWN AEROBIC AND ANAEROBIC Blood Culture adequate volume   Culture   Final    NO GROWTH 2 DAYS Performed at [facility], [address] [place], [place] [address]    Report Status PENDING  Incomplete  CULTURE, BLOOD (ROUTINE X 2) w Reflex to ID Panel     Status: None (Preliminary result)   Collection Time: 10/20/19 11:14 AM   Specimen: BLOOD  Result Value Ref Range Status   Specimen Description BLOOD LEFT ANTECUBITAL  Final   Special Requests   Final    BOTTLES DRAWN AEROBIC AND ANAEROBIC Blood Culture adequate volume   Culture   Final    NO GROWTH 2 DAYS Performed at [facility], [address] [place], [place] [address]    Report Status PENDING  Incomplete  KOH prep     Status: None   Collection Time: 10/21/19  3:16 PM   Specimen: Esophagus  Result Value Ref Range Status   Specimen Description ESOPHAGUS  Final   Special Requests Normal  Final   KOH Prep   Final    NO YEAST OR FUNGAL ELEMENTS SEEN Performed at [facility], [address] [place], [place] [address]    Report Status 10/21/2019 FINAL  Final    Studies/Results: No results found. Medications:  I have reviewed the patient's current medications. Prior to Admission:  Medications Prior to Admission  Medication Sig Dispense Refill Last Dose  . amLODipine (NORVASC) 10 MG tablet Take 10 mg by mouth daily.   10/17/2019 at Unknown time  . aspirin EC 81 MG tablet Take 81 mg by mouth daily.   10/17/2019 at Unknown time  . clopidogrel (PLAVIX) 75 MG tablet Take 1 tablet (75 mg total) by mouth daily. 30 tablet 0 10/17/2019 at Unknown time  . famotidine (PEPCID) 20 MG tablet Take 1 tablet (20 mg total) by mouth daily. 60 tablet 0 10/17/2019 at Unknown time  . gabapentin (NEURONTIN) 300 MG capsule Take 300 mg by mouth 2 (two) times daily.   10/17/2019 at Unknown time  . hydrochlorothiazide (HYDRODIURIL) 25 MG tablet Take 25 mg by mouth daily.   10/17/2019 at Unknown time  . insulin detemir (LEVEMIR) 100 UNIT/ML injection Inject 25 Units into the skin 2 (two) times daily.  10/17/2019 at Unknown time   Scheduled: . amLODipine  10 mg Oral Daily  . clopidogrel  75 mg Oral Daily  . docusate sodium  100 mg Oral Daily  . enoxaparin (LOVENOX) injection  40 mg Subcutaneous Q24H  . feeding supplement (GLUCERNA SHAKE)  237 mL Oral TID BM  . gabapentin  300 mg Oral BID  . ibuprofen  400 mg Oral TID WC  . insulin aspart  0-15 Units Subcutaneous TID WC  . insulin aspart  0-5 Units Subcutaneous QHS  . insulin detemir  10 Units Subcutaneous Once  . insulin detemir  25 Units Subcutaneous BID  . lidocaine  1 patch Transdermal Q24H  . magic mouthwash  10 mL Oral QID   And  . lidocaine  5 mL Mouth/Throat QID  . LORazepam  1 mg Oral Once  . multivitamin with minerals  1 tablet Oral Daily  . nicotine  21 mg Transdermal Daily  . oxyCODONE-acetaminophen  2 tablet Oral Q6H  . pantoprazole (PROTONIX) IV  40 mg Intravenous Q12H  . polyethylene glycol  17 g Oral Daily  . sucralfate  1 g Oral TID WC & HS  . tamsulosin  0.4 mg Oral Daily   Continuous:  VZD:[id number],  dextrose, hydrALAZINE, lidocaine, morphine injection, ondansetron (ZOFRAN) IV, traMADol Anti-infectives (From admission, onward)   Start     Dose/Rate Route Frequency Ordered Stop   10/20/19 1100  ceFAZolin (ANCEF) IVPB 1 g/50 mL premix     1 g 100 mL/hr over 30 Minutes Intravenous  Once 10/20/19 0909 10/20/19 1134     Scheduled Meds: . amLODipine  10 mg Oral Daily  . clopidogrel  75 mg Oral Daily  . docusate sodium  100 mg Oral Daily  . enoxaparin (LOVENOX) injection  40 mg Subcutaneous Q24H  . feeding supplement (GLUCERNA SHAKE)  237 mL Oral TID BM  . gabapentin  300 mg Oral BID  . ibuprofen  400 mg Oral TID WC  . insulin aspart  0-15 Units Subcutaneous TID WC  . insulin aspart  0-5 Units Subcutaneous QHS  . insulin detemir  10 Units Subcutaneous Once  . insulin detemir  25 Units Subcutaneous BID  . lidocaine  1 patch Transdermal Q24H  . magic mouthwash  10 mL Oral QID   And  . lidocaine  5 mL Mouth/Throat QID  . LORazepam  1 mg Oral Once  . multivitamin with minerals  1 tablet Oral Daily  . nicotine  21 mg Transdermal Daily  . oxyCODONE-acetaminophen  2 tablet Oral Q6H  . pantoprazole (PROTONIX) IV  40 mg Intravenous Q12H  . polyethylene glycol  17 g Oral Daily  . sucralfate  1 g Oral TID WC & HS  . tamsulosin  0.4 mg Oral Daily   Continuous Infusions: PRN Meds:.acetaminophen, dextrose, hydrALAZINE, lidocaine, morphine injection, ondansetron (ZOFRAN) IV, traMADol   Assessment: Principal Problem:   Ureterolithiasis Active Problems:   Hyperglycemic crisis in diabetes mellitus (HCC) Chest pain, epigastric pain secondary to severe erosive esophagitis based on EGD on 4/12 KOH more negative for candidiasis Hemoglobin A1c >15 during this admission  Plan: Diet as tolerated Continue sucralfate suspension 4 times a day, Protonix 40 mg IV twice daily Switch to omeprazole 40 mg p.o. twice daily long-term Continue oral Viscous Lidocaine Tight control of diabetes Patient will  need repeat upper endoscopy as outpatient.  Patient has to establish with GI locally when he moves to [place], [place] upon discharge Minimize NSAID use, currently on ibuprofen 400  mg 3 times a day.  This can exacerbate dyspepsia and reflux esophagitis Patient may also have a component of diabetic gastroparesis contributing to epigastric pain Consider trial of GI cocktail for epigastric pain   LOS: 4 days   Mounir Skipper 10/22/2019, 7:32 PM

## 2019-10-22 NOTE — Consult Note (Addendum)
Subjective:   CC: hernia  HPI:  Samuel [name] is a 59 y.o. male who was referred by Denton Lank for evaluation of above cc.   Symptoms were first noted 1 year ago, after dx of CKD. Pain is focal localized to right abdominal side, sharp, intense, wax and wanes.  Associated with fluctuating size lump per report, that changes in size with BMs as well as urination, exacerbated by touch.  Currently admitted for other medical issues.  Last BM was 7days ago per report.  Atypical for patient.    Past Medical History:  has a past medical history of Diabetes mellitus without complication (HCC), Hypertension, and UTI (urinary tract infection).  Past Surgical History:  Past Surgical History:  Procedure Laterality Date  . CORONARY ANGIOPLASTY WITH STENT PLACEMENT    . CYSTOSCOPY/URETEROSCOPY/HOLMIUM LASER/STENT PLACEMENT Right 10/20/2019   Procedure: CYSTOSCOPY/URETEROSCOPY/HOLMIUM LASER/STENT PLACEMENT;  Surgeon: [name], MD;  Location: [facility] ORS;  Service: Urology;  Laterality: Right;  . ESOPHAGOGASTRODUODENOSCOPY N/A 10/21/2019   Procedure: ESOPHAGOGASTRODUODENOSCOPY (EGD);  Surgeon: Toney Reil, MD;  Location: [facility] ENDOSCOPY;  Service: Gastroenterology;  Laterality: N/A;  . FOOT AMPUTATION    . HERNIA REPAIR    . INCISION AND DRAINAGE PERIRECTAL ABSCESS N/A 09/20/2019   Procedure: IRRIGATION AND DEBRIDEMENT PERIRECTAL ABSCESS;  Surgeon: Carolan Shiver, MD;  Location: [facility] ORS;  Service: General;  Laterality: N/A;    Family History: reviewed and not relevant to CC  Social History:  reports that he has quit smoking. He has never used smokeless tobacco. He reports previous alcohol use. No history on file for drug.  Current Medications:  Medications Prior to Admission  Medication Sig Dispense Refill  . amLODipine (NORVASC) 10 MG tablet Take 10 mg by mouth daily.    Marland Kitchen aspirin EC 81 MG tablet Take 81 mg by mouth daily.    . clopidogrel (PLAVIX) 75 MG tablet Take 1 tablet (75 mg  total) by mouth daily. 30 tablet 0  . famotidine (PEPCID) 20 MG tablet Take 1 tablet (20 mg total) by mouth daily. 60 tablet 0  . gabapentin (NEURONTIN) 300 MG capsule Take 300 mg by mouth 2 (two) times daily.    . hydrochlorothiazide (HYDRODIURIL) 25 MG tablet Take 25 mg by mouth daily.    . insulin detemir (LEVEMIR) 100 UNIT/ML injection Inject 25 Units into the skin 2 (two) times daily.       Allergies:  Allergies as of 10/18/2019  . (No Known Allergies)    ROS:  General: Denies weight loss, weight gain, fatigue, fevers, chills, and night sweats. Eyes: Denies blurry vision, double vision, eye pain, itchy eyes, and tearing. Ears: Denies hearing loss, earache, and ringing in ears. Nose: Denies sinus pain, congestion, infections, runny nose, and nosebleeds. Mouth/throat: Denies hoarseness, sore throat, bleeding gums, and difficulty swallowing. Heart: Denies chest pain, palpitations, racing heart, irregular heartbeat, leg pain or swelling, and decreased activity tolerance. Respiratory: Denies breathing difficulty, shortness of breath, wheezing, cough, and sputum. GI: Denies change in appetite, diarrhea, and blood in stool. GU: Denies difficulty urinating,  blood in urine. Musculoskeletal: Denies joint stiffness, pain, swelling, muscle weakness. Skin: Denies rash, itching, mass, tumors, sores, and boils Neurologic: Denies headache, fainting, dizziness, seizures, numbness, and tingling. Psychiatric: Denies depression, anxiety, difficulty sleeping, and memory loss. Endocrine: Denies heat or cold intolerance, and increased thirst or urination. Blood/lymph: Denies easy bruising, easy bruising, and swollen glands     Objective:     BP (!) 195/97 (BP Location: Right Arm)  Pulse (!) 101   Temp 98.6 F (37 C) (Oral)   Resp 16   Ht 5\' 10"  (1.778 m)   Wt 89.9 kg   SpO2 98%   BMI 28.44 kg/m   Constitutional :  alert, cooperative, appears stated age and no distress   Lymphatics/Throat:  no asymmetry, masses, or scars  Respiratory:  clear to auscultation bilaterally  Cardiovascular:  regular rate and rhythm  Gastrointestinal: soft, no focal TTP except overlying distal aspect of 11th and 12 flotaing ribs on right side.  no overlying skin changes, no mass or palpable hernia in area..rest of abdominal exam benign  Musculoskeletal: lying in bed, no obvious difficulty moving upper extremities  Skin: Cool and moist  Psychiatric: Normal affect, non-agitated, not confused       LABS:  CMP Latest Ref Rng & Units 10/22/2019 10/21/2019 10/20/2019  Glucose 70 - 99 mg/dL 208(H) 321(H) 156(H)  BUN 6 - 20 mg/dL 20 21(H) 17  Creatinine 0.61 - 1.24 mg/dL 0.95 1.16 0.97  Sodium 135 - 145 mmol/L 139 131(L) 137  Potassium 3.5 - 5.1 mmol/L 3.5 4.3 3.0(L)  Chloride 98 - 111 mmol/L 101 95(L) 99  CO2 22 - 32 mmol/L 30 25 29   Calcium 8.9 - 10.3 mg/dL 8.4(L) 8.3(L) 8.3(L)  Total Protein 6.5 - 8.1 g/dL - - -  Total Bilirubin 0.3 - 1.2 mg/dL - - -  Alkaline Phos 38 - 126 U/L - - -  AST 15 - 41 U/L - - -  ALT 0 - 44 U/L - - -   CBC Latest Ref Rng & Units 10/22/2019 10/21/2019 10/20/2019  WBC 4.0 - 10.5 K/uL 12.1(H) 14.4(H) 17.8(H)  Hemoglobin 13.0 - 17.0 g/dL 12.7(L) 14.0 13.7  Hematocrit 39.0 - 52.0 % 37.2(L) 39.0 40.3  Platelets 150 - 400 K/uL 266 319 287    RADS: CLINICAL DATA:  59 year old male with flank pain. Concern for kidney stone.  EXAM: CT ABDOMEN AND PELVIS WITHOUT CONTRAST  TECHNIQUE: Multidetector CT imaging of the abdomen and pelvis was performed following the standard protocol without IV contrast.  COMPARISON:  CT abdomen pelvis dated 10/01/2013.  FINDINGS: Evaluation of this exam is limited in the absence of intravenous contrast.  Lower chest: The visualized lung bases are clear.  No intra-abdominal free air or free fluid.  Hepatobiliary: The liver is unremarkable. No intrahepatic biliary ductal dilatation. The gallbladder is  unremarkable.  Pancreas: Unremarkable. No pancreatic ductal dilatation or surrounding inflammatory changes.  Spleen: Normal in size without focal abnormality.  Adrenals/Urinary Tract: The adrenal glands are unremarkable. There is a 3 cm left renal inferior pole cyst. There is a 5 mm stone in the distal right ureter. There is minimal right hydronephrosis. No stone identified within the right kidney. There is no hydronephrosis or nephrolithiasis on the left. The left ureter and urinary bladder appear unremarkable.  Stomach/Bowel: There is minimal circumferential thickening of the distal esophagus which may be partly related to underdistention or represent mild esophagitis. Clinical correlation is recommended. There is moderate stool throughout the colon. There is no bowel obstruction or active inflammation. Normal appendix.  Vascular/Lymphatic: Minimal aortoiliac atherosclerotic disease. The IVC is unremarkable. No portal venous gas. There is no adenopathy.  Reproductive: The prostate and seminal vesicles are grossly unremarkable. No pelvic mass. Scattered calcification of the peripheral corpus cavernosum of the penis noted, progressed since the prior CT.  Other: None  Musculoskeletal: Osteopenia with degenerative changes of the spine. No acute osseous pathology.  IMPRESSION: 1. A 5  mm distal right ureteral stone with minimal right hydronephrosis. 2. No bowel obstruction. Normal appendix. 3. Mild circumferential thickening of the distal esophagus may be related to underdistention or represent mild esophagitis. Clinical correlation is recommended. 4. Aortic Atherosclerosis (ICD10-I70.0). Assessment:     Right rib pain, constipation Plan:    no hernia on exam. tenderness is more underneath ribs. reported no BM in 7days? so should get him cleaned out to see if that improves pain, since he mentioned it has helped before. CT also did not show hernia. i ordered lido  patch and meds to clean out. signing off.  Please call with new questions or concerns.  Further management per primary and other consultants.

## 2019-10-22 NOTE — Consult Note (Signed)
WOC Nurse Consult Note: Reason for Consult: right foot wound Patient self reports he has transmet and that he has area that has formed "calcium deposit" He self reports that he has been using "pumice stone which causes it to bleed"  Wound type:hyperkeratotic growth, central healed transmet site right foot. No open wound Pressure Injury POA: NA Measurement: 1cm x 1cm protruding growth  Wound bed: no open wound  Drainage (amount, consistency, odor) none at the time of my assessment.  Does appear to have scant amount of dried blood Periwound: intact Dressing procedure/placement/frequency: No topical care needed  Discussed with patient he needs to establish a relationship with a podiatrist. They will be the best option to keep area pared down and more comfortable for patient.  He proceeds to explain he has no insurance to pay doctor and before "I get a knife and start cutting or sanding on it someone here can fix it".  I tried to explain that with his co morbilities (DM, CAD, PAD) that it would be in his best interest to leave area alone. He could cause more damage by opening the area with any type of self inflicted debridement. He does seem a bit on edge on everything you ask him today.  I discussed  offloading and he reports "that shoe wont stay on and I'll fall".  Offered darco offloading shoe or possible orthotic, he is very defensive about any of this.  Topical care is not needed currently.   Will make admitting team aware of patient's concerns. When asked if the area is causing the patient in problems he reports "when I walk from one end of the trailer to the other I have to sit down from pain and swelling".  "I can't walk from my house to the end of the driveway without pain and swelling".   Consider podiatrist referral while inpatient.  Discussed POC with patient and bedside nurse.  Re consult if needed, will not follow at this time. Thanks  Seraya Jobst [employer], RN,CWOCN, CNS, CWON-AP  [phone])

## 2019-10-22 NOTE — Progress Notes (Signed)
Urology Inpatient Progress Note  Subjective: Samuel [name] is a 59 y.o. comorbid male admitted on 10/18/2019 with right renal colic with nausea and vomiting secondary to a 5 mm distal right ureteral stone.  He is POD 2 from cystoscopy and right ureteroscopy with laser lithotripsy and right ureteral stent placement with Dr. Richardo Hanks.  UA on admission was completely benign.    He underwent upper endoscopy yesterday for evaluation of possible esophageal food impaction with findings of erosive due adenopathy without bleeding, LA grade D reflux, candidiasis, and erosive esophagitis without bleeding, and esophageal plaques suspicious for candidiasis.  Awaiting biopsy and cytology results.  WBC count down today, 12.1.  Creatinine down today, 0.95.  Blood cultures pending with no growth at 2 days.  Today, patient continues to report constipation.  He reports sharp RLQ pain with flatus.  He describes burning with urination and dark urinary output and states he is hesitant to drink water due to these.  Additionally, he has an overgrown toenail on his right foot that he wishes to have addressed.  Anti-infectives: Anti-infectives (From admission, onward)   Start     Dose/Rate Route Frequency Ordered Stop   10/20/19 1100  ceFAZolin (ANCEF) IVPB 1 g/50 mL premix     1 g 100 mL/hr over 30 Minutes Intravenous  Once 10/20/19 0909 10/20/19 1134      Current Facility-Administered Medications  Medication Dose Route Frequency Provider Last Rate Last Admin  . acetaminophen (TYLENOL) tablet 650 mg  650 mg Oral Q6H PRN [name], MD      . amLODipine (NORVASC) tablet 10 mg  10 mg Oral Daily [name], MD   10 mg at 10/21/19 0829  . clopidogrel (PLAVIX) tablet 75 mg  75 mg Oral Daily [name], MD   75 mg at 10/22/19 0827  . dextrose 50 % solution 0-50 mL  0-50 mL Intravenous PRN Legrand Rams C, MD      . enoxaparin (LOVENOX) injection 40 mg  40 mg Subcutaneous Q24H [name], MD   40 mg  at 10/22/19 0211  . feeding supplement (GLUCERNA SHAKE) (GLUCERNA SHAKE) liquid 237 mL  237 mL Oral TID BM [name], MD   237 mL at 10/21/19 2129  . gabapentin (NEURONTIN) capsule 300 mg  300 mg Oral BID [name], MD   300 mg at 10/22/19 5038  . hydrALAZINE (APRESOLINE) injection 10 mg  10 mg Intravenous Q4H PRN [name], MD   10 mg at 10/20/19 1501  . ibuprofen (ADVIL) tablet 400 mg  400 mg Oral TID WC Esaw Grandchild A, DO   400 mg at 10/22/19 0826  . insulin aspart (novoLOG) injection 0-15 Units  0-15 Units Subcutaneous TID WC [name], MD   3 Units at 10/22/19 0830  . insulin aspart (novoLOG) injection 0-5 Units  0-5 Units Subcutaneous QHS [name], MD   3 Units at 10/21/19 2126  . insulin detemir (LEVEMIR) injection 10 Units  10 Units Subcutaneous Once [name], MD      . insulin detemir (LEVEMIR) injection 25 Units  25 Units Subcutaneous BID [name], MD   25 Units at 10/21/19 2127  . lidocaine (XYLOCAINE) 2 % viscous mouth solution 15 mL  15 mL Mouth/Throat Q3H PRN Esaw Grandchild A, DO      . magic mouthwash  10 mL Oral QID [name], MD   10 mL at 10/22/19 0524   And  .  lidocaine (XYLOCAINE) 2 % viscous mouth solution 5 mL  5 mL Mouth/Throat QID Billey Co, MD   5 mL at 10/21/19 2218  . LORazepam (ATIVAN) tablet 1 mg  1 mg Oral Once Nicole Kindred A, DO      . magnesium citrate solution 1 Bottle  1 Bottle Oral Once PRN Nicole Kindred A, DO      . morphine 2 MG/ML injection 2 mg  2 mg Intravenous Q4H PRN Nicole Kindred A, DO   2 mg at 10/21/19 1217  . multivitamin with minerals tablet 1 tablet  1 tablet Oral Daily Billey Co, MD   1 tablet at 10/22/19 0826  . nicotine (NICODERM CQ - dosed in mg/24 hours) patch 21 mg  21 mg Transdermal Daily Billey Co, MD   21 mg at 10/22/19 0834  . ondansetron (ZOFRAN) injection 4 mg  4 mg Intravenous Q6H PRN Billey Co, MD   4 mg at 10/20/19 1535  .  oxyCODONE-acetaminophen (PERCOCET/ROXICET) 5-325 MG per tablet 2 tablet  2 tablet Oral Q6H Nicole Kindred A, DO   2 tablet at 10/22/19 0828  . pantoprazole (PROTONIX) injection 40 mg  40 mg Intravenous Q12H Lin Landsman, MD   40 mg at 10/22/19 0828  . sucralfate (CARAFATE) 1 GM/10ML suspension 1 g  1 g Oral TID WC & HS Lin Landsman, MD   1 g at 10/21/19 2128  . tamsulosin (FLOMAX) capsule 0.4 mg  0.4 mg Oral Daily Billey Co, MD   0.4 mg at 10/22/19 0826  . traMADol (ULTRAM) tablet 100 mg  100 mg Oral Q6H PRN Billey Co, MD       Objective: Vital signs in last 24 hours: Temp:  [97.5 F (36.4 C)-98.7 F (37.1 C)] 98.6 F (37 C) (04/13 0801) Pulse Rate:  [98-115] 101 (04/13 0801) Resp:  [10-18] 16 (04/13 0801) BP: (140-195)/(73-97) 195/97 (04/13 0801) SpO2:  [94 %-100 %] 98 % (04/13 0801)  Intake/Output from previous day: 04/12 0701 - 04/13 0700 In: 500 [I.V.:500] Out: 750 [Urine:750] Intake/Output this shift: No intake/output data recorded.  Physical Exam Vitals and nursing note reviewed.  Constitutional:      General: He is not in acute distress.    Appearance: He is not ill-appearing, toxic-appearing or diaphoretic.  HENT:     Head: Normocephalic and atraumatic.  Pulmonary:     Effort: Pulmonary effort is normal. No respiratory distress.  Skin:    General: Skin is warm and dry.  Neurological:     Mental Status: He is alert and oriented to person, place, and time.  Psychiatric:        Mood and Affect: Affect is labile.    Lab Results:  Recent Labs    10/21/19 0715 10/22/19 0523  WBC 14.4* 12.1*  HGB 14.0 12.7*  HCT 39.0 37.2*  PLT 319 266   BMET Recent Labs    10/21/19 0715 10/22/19 0523  NA 131* 139  K 4.3 3.5  CL 95* 101  CO2 25 30  GLUCOSE 321* 208*  BUN 21* 20  CREATININE 1.16 0.95  CALCIUM 8.3* 8.4*   Assessment & Plan: 59 year old comorbid male s/p right URS/LL/stent for management of a 5 mm distal ureteral stone.  He  reports some mild stent discomfort which is likely exacerbated by constipation.  Recommend deferring oxybutynin with concerns for contributing to constipation.  I do believe he would benefit from B&O suppositories due to his reports of dysuria.  Dark urine likely represents urinary concentration versus hematuria, normal in the setting of a ureteral stent.  Reiterated to patient that his stent will come out in 2 days.  Recommendations: -Bowel regimen for constipation management -B&O suppositories for relief of stent discomfort  Carman Ching, PA-C 10/22/2019

## 2019-10-23 ENCOUNTER — Inpatient Hospital Stay: Payer: Medicaid Other

## 2019-10-23 ENCOUNTER — Encounter: Payer: Self-pay | Admitting: Internal Medicine

## 2019-10-23 DIAGNOSIS — F29 Unspecified psychosis not due to a substance or known physiological condition: Secondary | ICD-10-CM

## 2019-10-23 DIAGNOSIS — N201 Calculus of ureter: Secondary | ICD-10-CM

## 2019-10-23 LAB — CBC WITH DIFFERENTIAL/PLATELET
Abs Immature Granulocytes: 0.05 10*3/uL (ref 0.00–0.07)
Basophils Absolute: 0 10*3/uL (ref 0.0–0.1)
Basophils Relative: 0 %
Eosinophils Absolute: 0.1 10*3/uL (ref 0.0–0.5)
Eosinophils Relative: 1 %
HCT: 37.3 % — ABNORMAL LOW (ref 39.0–52.0)
Hemoglobin: 12.3 g/dL — ABNORMAL LOW (ref 13.0–17.0)
Immature Granulocytes: 0 %
Lymphocytes Relative: 28 %
Lymphs Abs: 3.3 10*3/uL (ref 0.7–4.0)
MCH: 32.1 pg (ref 26.0–34.0)
MCHC: 33 g/dL (ref 30.0–36.0)
MCV: 97.4 fL (ref 80.0–100.0)
Monocytes Absolute: 0.8 10*3/uL (ref 0.1–1.0)
Monocytes Relative: 7 %
Neutro Abs: 7.5 10*3/uL (ref 1.7–7.7)
Neutrophils Relative %: 64 %
Platelets: 288 10*3/uL (ref 150–400)
RBC: 3.83 MIL/uL — ABNORMAL LOW (ref 4.22–5.81)
RDW: 12.2 % (ref 11.5–15.5)
WBC: 11.7 10*3/uL — ABNORMAL HIGH (ref 4.0–10.5)
nRBC: 0 % (ref 0.0–0.2)

## 2019-10-23 LAB — BASIC METABOLIC PANEL
Anion gap: 8 (ref 5–15)
BUN: 18 mg/dL (ref 6–20)
CO2: 33 mmol/L — ABNORMAL HIGH (ref 22–32)
Calcium: 8.3 mg/dL — ABNORMAL LOW (ref 8.9–10.3)
Chloride: 100 mmol/L (ref 98–111)
Creatinine, Ser: 0.96 mg/dL (ref 0.61–1.24)
GFR calc Af Amer: >60 mL/min (ref >60)
GFR calc non Af Amer: >60 mL/min (ref >60)
Glucose, Bld: 82 mg/dL (ref 70–99)
Potassium: 3.7 mmol/L (ref 3.5–5.1)
Sodium: 141 mmol/L (ref 135–145)

## 2019-10-23 LAB — GLUCOSE, CAPILLARY
Glucose-Capillary: 121 mg/dL — ABNORMAL HIGH (ref 70–99)
Glucose-Capillary: 215 mg/dL — ABNORMAL HIGH (ref 70–99)
Glucose-Capillary: 250 mg/dL — ABNORMAL HIGH (ref 70–99)
Glucose-Capillary: 116 mg/dL — ABNORMAL HIGH (ref 70–99)
Glucose-Capillary: 113 mg/dL — ABNORMAL HIGH (ref 70–99)
Glucose-Capillary: 94 mg/dL (ref 70–99)
Glucose-Capillary: 109 mg/dL — ABNORMAL HIGH (ref 70–99)
Glucose-Capillary: 155 mg/dL — ABNORMAL HIGH (ref 70–99)
Glucose-Capillary: 161 mg/dL — ABNORMAL HIGH (ref 70–99)

## 2019-10-23 LAB — VITAMIN B12: Vitamin B-12: 296 pg/mL (ref 180–914)

## 2019-10-23 LAB — SURGICAL PATHOLOGY

## 2019-10-23 LAB — VITAMIN D 25 HYDROXY (VIT D DEFICIENCY, FRACTURES): Vit D, 25-Hydroxy: 13.32 ng/mL — ABNORMAL LOW (ref 30–100)

## 2019-10-23 MED ORDER — INSULIN ASPART 100 UNIT/ML ~~LOC~~ SOLN
0.0000 [IU] | Freq: Three times a day (TID) | SUBCUTANEOUS | Status: DC
  Administered 2019-10-25: 7 [IU] via SUBCUTANEOUS
  Administered 2019-10-25: 1 [IU] via SUBCUTANEOUS
  Administered 2019-10-26: 13:00:00 3 [IU] via SUBCUTANEOUS
  Filled 2019-10-23 (×4): qty 1

## 2019-10-23 MED ORDER — VITAMIN D 25 MCG (1000 UNIT) PO TABS
1000.0000 [IU] | ORAL_TABLET | Freq: Every day | ORAL | Status: DC
  Administered 2019-10-24 – 2019-10-26 (×3): 1000 [IU] via ORAL
  Filled 2019-10-23 (×3): qty 1

## 2019-10-23 MED ORDER — VITAMIN B-12 1000 MCG PO TABS: 500.0000 ug | ORAL_TABLET | Freq: Every day | ORAL | Status: DC

## 2019-10-23 MED ORDER — NYSTATIN 100000 UNIT/ML MT SUSP
5.0000 mL | Freq: Three times a day (TID) | OROMUCOSAL | Status: DC
  Administered 2019-10-23 – 2019-10-26 (×9): 500000 [IU] via OROMUCOSAL
  Filled 2019-10-23 (×9): qty 5

## 2019-10-23 MED ORDER — IOHEXOL 300 MG/ML  SOLN
100.0000 mL | Freq: Once | INTRAMUSCULAR | Status: AC | PRN
  Administered 2019-10-23: 100 mL via INTRAVENOUS

## 2019-10-23 MED ORDER — SULFAMETHOXAZOLE-TRIMETHOPRIM 800-160 MG PO TABS
1.0000 | ORAL_TABLET | Freq: Two times a day (BID) | ORAL | Status: DC
  Administered 2019-10-23 (×2): 1 via ORAL
  Filled 2019-10-23 (×2): qty 1

## 2019-10-23 MED ORDER — VITAMIN B-12 1000 MCG PO TABS
500.0000 ug | ORAL_TABLET | Freq: Every day | ORAL | Status: DC
  Administered 2019-10-24 – 2019-10-26 (×3): 500 ug via ORAL
  Filled 2019-10-23 (×3): qty 1

## 2019-10-23 MED ORDER — LORAZEPAM 2 MG/ML IJ SOLN
1.0000 mg | Freq: Four times a day (QID) | INTRAMUSCULAR | Status: DC | PRN
  Administered 2019-10-23 – 2019-10-24 (×2): 1 mg via INTRAVENOUS
  Filled 2019-10-23 (×2): qty 1

## 2019-10-23 NOTE — Progress Notes (Signed)
patient asleep

## 2019-10-23 NOTE — Progress Notes (Signed)
   10/23/19 1500  Clinical Encounter Type  Visited With Patient  Visit Type Initial;Spiritual support  Referral From Nurse  Consult/Referral To Chaplain  Chaplain visited with patient per OR. When Patient said he is having delusions and said "God if you help me I will do better." Patient mentioned taking care of his ex-girlfriend after she had surgery. He said she got mad at him, spit on him, and told him that he was the reason his mother killed herself. Patient said he lives with step-brother, but it is not a good living condition. Patient said that his brother and girlfriend eats his food and all that is left is hot dogs and hamburgers which isn't good for his diabetes. Patient has ulcers in his mouth. He is constipated. Patient is also having problems with his hemorrhoids. Patient said that his sugar has been as high as 1000, but while we were talking, he complained about it dropping. Patient is having illusions and is scared to go to sleep. He said other than during surgery he hasn't slept in 7 days. Patient said that a psychiatrist was in to see him. Patient believes his nephew found him a room, but he needs transportation to get to it. Patient thought he missed a doctor's appointment at [facility], but when the Chaplain called, receptionist said she didn't see an appointment. Chaplain asked her to note the chart that patient is in the hospital. Patient has a lot going on, and Chaplain prayed according to what he requested. After praying, Chaplain notified patient's nurse that he said his numbers a dropping. Chaplain will make sure a follow up is done.

## 2019-10-23 NOTE — Progress Notes (Addendum)
Progress Note    Samuel [name]  IHK:[id number] DOB: 1961-04-01  DOA: 10/18/2019 PCP: [facility] [place]      Brief Narrative:    Medical records reviewed and are as summarized below:  Samuel [name] is an 59 y.o. male with history of coronary disease status post stent x1 in 2019, diabetes mellitus type 2 on insulin therapy complicated by DM neuropathy, essential hypertension and history of right-sided nephrolithiasis status post stent in the past. Patient is a resident of [place], and relocated to [place] area. Presented on 4/9 with intractable nausea and vomiting, right flank pain.  In the ED, glucose 900.  Patient had missed some of his insulin since moving.    Imaging studies did reveal right-sided nephrolithiasis with a 76mm distal ureteral stone. Admitted to hospitalist service for admission and further management.      Assessment/Plan:   Principal Problem:   Ureterolithiasis Active Problems:   Hyperglycemic crisis in diabetes mellitus ([place])   Psychosis ([place])   Body mass index is 28.44 kg/m.   Abdominal wall pain with swelling/constipation. -no acute abnormality noted on CT abdomen and pelvis.  Patient has been evaluated by general surgeon and he has signed off because there are no surgical issues at this time.  Ordered tapwater enema (per patient's request) for constipation.  Mood lability, depression, suicidal ideation - Psychiatrist has been consulted.  Follow-up with psychiatrist.  Possible esophageal food impaction/erosive esophagitis- GI consulted .  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  Potassium hydroxide test was negative for any fungal elements.  Pathology report is pending.  Ureterolithiasis, right-sided -patient with severe right flank pain radiating to the groin.  Urology consulted.  Underwent cystoscopy and right ureteroscopy with laser lithotripsy and right ureteral stent  placement on 4/11.  Benign UA, no infection. --Continue Flomax --Analgesics as needed. --Per urology: Foley catheter and ureteral stent to be removed tomorrow. Bactrim x5 days while stent in place, oxybutynin XL 10 mg daily as needed, continue Flomax x7 days, NSAIDs or narcotics for pain control.    Leukocytosis -suspect this is reactive.  Patient is afebrile, UA was negative, no respiratory symptoms. --No growth on blood cultures thus far. --Monitor CBC --Monitor clinically for signs and symptoms of infection  Lactic acidosis -resolved.  Present on admission, --No growth on blood cultures thus far.  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  Fairly uncontrolled , A1c 15.5%!!   --Continue Levemir and NovoLog sliding scale.  Dose of sliding scale has been decreased because of hypoglycemic episode on 10/22/2019. --Continue gabapentin  Mouth pain, most likely oral thrush (has history) --Magic mouthwash with lidocaine qid --added viscous lidocaine for in between doses  Coronary artery disease, with prior stent.  Stable and no active chest pain.  Continue Plavix.  Vitamin D deficiency:  Start vitamin D supplement.  Borderline low vitamin B12:  Start vitamin B12 supplement.   Family Communication/Anticipated D/C date and plan/Code Status   DVT prophylaxis: Lovenox Code Status: Full code Family Communication: Plan discussed with the patient Disposition Plan:    Status is: Inpatient  Remains inpatient appropriate because:Inpatient level of care appropriate due to severity of illness   Dispo: The patient is from: Home              Anticipated d/c is to: Home              Anticipated d/c date is: 2 days  Patient currently is not medically stable to d/c.            Subjective:   He complains of abdominal fullness and constipation.  He is also worried that he he may be getting too much NovoLog because  of episode of hypoglycemia yesterday.  He also complains of visual hallucinations and seeing things double even when they are not there.  Objective:    Vitals:   10/22/19 0801 10/22/19 1038 10/22/19 1622 10/23/19 0747  BP: (!) 195/97 (!) 150/85 126/82 (!) 159/93  Pulse: (!) 101 91 96 97  Resp: 16  20 18   Temp: 98.6 F (37 C)  98.5 F (36.9 C) 98.2 F (36.8 C)  TempSrc: Oral  Oral Oral  SpO2: 98% 98% 97% 98%  Weight:      Height:        Intake/Output Summary (Last 24 hours) at 10/23/2019 1423 Last data filed at 10/23/2019 0945 Gross per 24 hour  Intake 360 ml  Output 1375 ml  Net -1015 ml   Filed Weights   10/18/19 1652 10/20/19 0040  Weight: 95.3 kg 89.9 kg    Exam:  GEN: NAD SKIN: No rash EYES: EOMI ENT: MMM CV: RRR PULM: CTA B ABD: soft, mildly distended, NT, +BS CNS: AAO x 3, non focal EXT: No edema or tenderness GU: Foley catheter with bloody urine   Data Reviewed:   I have personally reviewed following labs and imaging studies:  Labs: Labs show the following:   Basic Metabolic Panel: Recent Labs  Lab 10/19/19 0742 10/19/19 0742 10/20/19 0813 10/20/19 0813 10/21/19 0715 10/21/19 0715 10/22/19 0523 10/23/19 0426  NA 137  --  137  --  131*  --  139 141  K 3.3*   < > 3.0*   < > 4.3   < > 3.5 3.7  CL 98  --  99  --  95*  --  101 100  CO2 29  --  29  --  25  --  30 33*  GLUCOSE 190*  --  156*  --  321*  --  208* 82  BUN 24*  --  17  --  21*  --  20 18  CREATININE 1.23  --  0.97  --  1.16  --  0.95 0.96  CALCIUM 8.5*  --  8.3*  --  8.3*  --  8.4* 8.3*   < > = values in this interval not displayed.   GFR Estimated Creatinine Clearance: 97 mL/min (by C-G formula based on SCr of 0.96 mg/dL). Liver Function Tests: Recent Labs  Lab 10/18/19 1707  AST 22  ALT 16  ALKPHOS 143*  BILITOT 1.5*  PROT 8.3*  ALBUMIN 4.0   Recent Labs  Lab 10/18/19 1707 10/20/19 0813  LIPASE 18 11   No results for input(s): AMMONIA in the last 168  hours. Coagulation profile No results for input(s): INR, PROTIME in the last 168 hours.  CBC: Recent Labs  Lab 10/18/19 1707 10/20/19 0813 10/21/19 0715 10/22/19 0523 10/23/19 0426  WBC 18.6* 17.8* 14.4* 12.1* 11.7*  NEUTROABS  --  14.3* 12.0* 9.3* 7.5  HGB 15.9 13.7 14.0 12.7* 12.3*  HCT 45.4 40.3 39.0 37.2* 37.3*  MCV 91.2 94.4 89.9 95.4 97.4  PLT 374 287 319 266 288   Cardiac Enzymes: No results for input(s): CKTOTAL, CKMB, CKMBINDEX, TROPONINI in the last 168 hours. BNP (last 3 results) No results for input(s): PROBNP in the last 8760 hours. CBG: Recent  Labs  Lab 10/22/19 2105 10/22/19 2156 10/23/19 0734 10/23/19 1156 10/23/19 1326  GLUCAP 51* 79 121* 215* 250*   D-Dimer: No results for input(s): DDIMER in the last 72 hours. Hgb A1c: No results for input(s): HGBA1C in the last 72 hours. Lipid Profile: No results for input(s): CHOL, HDL, LDLCALC, TRIG, CHOLHDL, LDLDIRECT in the last 72 hours. Thyroid function studies: No results for input(s): TSH, T4TOTAL, T3FREE, THYROIDAB in the last 72 hours.  Invalid input(s): FREET3 Anemia work up: Recent Labs    10/23/19 0426  VITAMINB12 296   Sepsis Labs: Recent Labs  Lab 10/18/19 1707 10/18/19 1831 10/19/19 0244 10/20/19 0813 10/20/19 1114 10/21/19 0715 10/22/19 0523 10/23/19 0426  WBC   < >  --   --  17.8*  --  14.4* 12.1* 11.7*  LATICACIDVEN  --  5.7* 4.6* 0.9 1.2  --   --   --    < > = values in this interval not displayed.    Microbiology Recent Results (from the past 240 hour(s))  Respiratory Panel by RT PCR (Flu A&B, Covid) - Nasopharyngeal Swab     Status: None   Collection Time: 10/18/19  6:31 PM   Specimen: Nasopharyngeal Swab  Result Value Ref Range Status   SARS Coronavirus 2 by RT PCR NEGATIVE NEGATIVE Final    Comment: (NOTE) SARS-CoV-2 target nucleic acids are NOT DETECTED. The SARS-CoV-2 RNA is generally detectable in upper respiratoy specimens during the acute phase of infection.  The lowest concentration of SARS-CoV-2 viral copies this assay can detect is 131 copies/mL. A negative result does not preclude SARS-Cov-2 infection and should not be used as the sole basis for treatment or other patient management decisions. A negative result may occur with  improper specimen collection/handling, submission of specimen other than nasopharyngeal swab, presence of viral mutation(s) within the areas targeted by this assay, and inadequate number of viral copies (<131 copies/mL). A negative result must be combined with clinical observations, patient history, and epidemiological information. The expected result is Negative. Fact Sheet for Patients:  https://www.moore.com/ Fact Sheet for Healthcare Providers:  https://www.young.biz/ This test is not yet ap proved or cleared by the [place] FDA and  has been authorized for detection and/or diagnosis of SARS-CoV-2 by FDA under an Emergency Use Authorization (EUA). This EUA will remain  in effect (meaning this test can be used) for the duration of the COVID-19 declaration under Section 564(b)(1) of the Act, 21 U.S.C. section 360bbb-3(b)(1), unless the authorization is terminated or revoked sooner.    Influenza A by PCR NEGATIVE NEGATIVE Final   Influenza B by PCR NEGATIVE NEGATIVE Final    Comment: (NOTE) The Xpert Xpress SARS-CoV-2/FLU/RSV assay is intended as an aid in  the diagnosis of influenza from Nasopharyngeal swab specimens and  should not be used as a sole basis for treatment. Nasal washings and  aspirates are unacceptable for Xpert Xpress SARS-CoV-2/FLU/RSV  testing. Fact Sheet for Patients: https://www.moore.com/ Fact Sheet for Healthcare Providers: https://www.young.biz/ This test is not yet approved or cleared by the [place] FDA and  has been authorized for detection and/or diagnosis of SARS-CoV-2 by  FDA under an  Emergency Use Authorization (EUA). This EUA will remain  in effect (meaning this test can be used) for the duration of the  Covid-19 declaration under Section 564(b)(1) of the Act, 21  U.S.C. section 360bbb-3(b)(1), unless the authorization is  terminated or revoked. Performed at [facility], [address] [place], [place] [address]  CULTURE, BLOOD (ROUTINE X 2) w Reflex to ID Panel     Status: None (Preliminary result)   Collection Time: 10/20/19  8:23 AM   Specimen: BLOOD  Result Value Ref Range Status   Specimen Description BLOOD BLOOD RIGHT WRIST  Final   Special Requests   Final    BOTTLES DRAWN AEROBIC AND ANAEROBIC Blood Culture adequate volume   Culture   Final    NO GROWTH 3 DAYS Performed at [facility], [address], [place], [place] [address]    Report Status PENDING  Incomplete  CULTURE, BLOOD (ROUTINE X 2) w Reflex to ID Panel     Status: None (Preliminary result)   Collection Time: 10/20/19 11:14 AM   Specimen: BLOOD  Result Value Ref Range Status   Specimen Description BLOOD LEFT ANTECUBITAL  Final   Special Requests   Final    BOTTLES DRAWN AEROBIC AND ANAEROBIC Blood Culture adequate volume   Culture   Final    NO GROWTH 3 DAYS Performed at [facility], [address], [place], [place] [address]    Report Status PENDING  Incomplete  KOH prep     Status: None   Collection Time: 10/21/19  3:16 PM   Specimen: Esophagus  Result Value Ref Range Status   Specimen Description ESOPHAGUS  Final   Special Requests Normal  Final   KOH Prep   Final    NO YEAST OR FUNGAL ELEMENTS SEEN Performed at [facility], [address], [place], [place] [address]    Report Status 10/21/2019 FINAL  Final    Procedures and diagnostic studies:  No results found.  Medications:   . amLODipine  10 mg Oral Daily  . [START ON 10/24/2019] cholecalciferol  1,000 Units Oral Daily  . clopidogrel  75 mg Oral Daily  . docusate sodium   100 mg Oral Daily  . enoxaparin (LOVENOX) injection  40 mg Subcutaneous Q24H  . feeding supplement (GLUCERNA SHAKE)  237 mL Oral TID BM  . gabapentin  300 mg Oral BID  . ibuprofen  400 mg Oral TID WC  . insulin aspart  0-9 Units Subcutaneous TID WC  . insulin detemir  10 Units Subcutaneous Once  . insulin detemir  25 Units Subcutaneous BID  . lidocaine  1 patch Transdermal Q24H  . magic mouthwash  10 mL Oral QID   And  . lidocaine  5 mL Mouth/Throat QID  . LORazepam  1 mg Oral Once  . multivitamin with minerals  1 tablet Oral Daily  . nicotine  21 mg Transdermal Daily  . oxyCODONE-acetaminophen  2 tablet Oral Q6H  . pantoprazole (PROTONIX) IV  40 mg Intravenous Q12H  . polyethylene glycol  17 g Oral Daily  . sucralfate  1 g Oral TID WC & HS  . sulfamethoxazole-trimethoprim  1 tablet Oral Q12H  . tamsulosin  0.4 mg Oral Daily  . [START ON 10/24/2019] vitamin B-12  500 mcg Oral Daily   Continuous Infusions:   LOS: 5 days   Kristoff Coonradt  [facility]     10/23/2019, 2:23 PM

## 2019-10-23 NOTE — Progress Notes (Signed)
Around 5:45pm pt called RN to room and reported that he felt like he "was collapsing" and was "about to go into a coma and not wake up." Pt was sitting up in bed and just finished eating about 95% of his dinner. He was diaphoretic, but vitals were stable, CBG rechecked and was 94. He denied chest pain and SOB. When pt was asked to further describe how he was feeling he continued to make statements above or that he "was going to have a stroke." He also mentioned that he hadn't slept in 7 days. Dr. Myriam Forehand paged. While waiting return call, charge RN came to to room to assess pt. Dr. Myriam Forehand made aware of above and asked if he could assess pt. Received no new orders; will continue to monitor pt's condition.

## 2019-10-23 NOTE — Progress Notes (Signed)
Urology Inpatient Progress Note  Subjective: Samuel [name] is a 59 y.o. comorbid male admitted on 10/18/2019 with right renal colic with nausea and vomiting secondary to a 5 mm distal right ureteral stone.  He is POD 3 from cystoscopy and right ureteroscopy with laser lithotripsy and right ureteral stent placement with Dr. Richardo Hanks.  UA on admission was completely benign.    He underwent upper endoscopy 10/21/2019 for evaluation of possible esophageal food impaction with findings of erosive due adenopathy without bleeding, LA grade D reflux, candidiasis, and erosive esophagitis without bleeding, and esophageal plaques suspicious for candidiasis.  Awaiting biopsy and cytology results.  WBC count down today, 11.7.  Creatinine stable today, 0.96.  Blood cultures pending with no growth at 2 days.  Today, patient continues to report constipation.  He is requesting an enema.  Nursing staff will contact attending for enema.  He is reporting right side pain.  Foley is in place with red urine without clots.     Anti-infectives: Anti-infectives (From admission, onward)   Start     Dose/Rate Route Frequency Ordered Stop   10/23/19 1000  sulfamethoxazole-trimethoprim (BACTRIM DS) 800-160 MG per tablet 1 tablet     1 tablet Oral Every 12 hours 10/23/19 0753     10/20/19 1100  ceFAZolin (ANCEF) IVPB 1 g/50 mL premix     1 g 100 mL/hr over 30 Minutes Intravenous  Once 10/20/19 0909 10/20/19 1134      Current Facility-Administered Medications  Medication Dose Route Frequency Provider Last Rate Last Admin  . acetaminophen (TYLENOL) tablet 650 mg  650 mg Oral Q6H PRN [name], MD      . amLODipine (NORVASC) tablet 10 mg  10 mg Oral Daily [name], MD   10 mg at 10/22/19 1040  . clopidogrel (PLAVIX) tablet 75 mg  75 mg Oral Daily [name], MD   75 mg at 10/22/19 0827  . dextrose 50 % solution 0-50 mL  0-50 mL Intravenous PRN [name], MD      . docusate sodium (COLACE) capsule  100 mg  100 mg Oral Daily Sakai, Isami, DO      . enoxaparin (LOVENOX) injection 40 mg  40 mg Subcutaneous Q24H [name], MD   40 mg at 10/23/19 0251  . feeding supplement (GLUCERNA SHAKE) (GLUCERNA SHAKE) liquid 237 mL  237 mL Oral TID BM [name], MD   237 mL at 10/22/19 1446  . gabapentin (NEURONTIN) capsule 300 mg  300 mg Oral BID Legrand Rams C, MD   300 mg at 10/22/19 2247  . hydrALAZINE (APRESOLINE) injection 10 mg  10 mg Intravenous Q4H PRN [name], MD   10 mg at 10/20/19 1501  . ibuprofen (ADVIL) tablet 400 mg  400 mg Oral TID WC Esaw Grandchild A, DO   400 mg at 10/23/19 0825  . insulin aspart (novoLOG) injection 0-15 Units  0-15 Units Subcutaneous TID WC [name], MD   3 Units at 10/22/19 0830  . insulin aspart (novoLOG) injection 0-5 Units  0-5 Units Subcutaneous QHS [name], MD   3 Units at 10/21/19 2126  . insulin detemir (LEVEMIR) injection 10 Units  10 Units Subcutaneous Once Legrand Rams C, MD      . insulin detemir (LEVEMIR) injection 25 Units  25 Units Subcutaneous BID [name], MD   25 Units at 10/22/19 1044  . lidocaine (LIDODERM) 5 % 1 patch  1 patch Transdermal  Q24H Sakai, Isami, DO   1 patch at 10/22/19 1140  . lidocaine (XYLOCAINE) 2 % viscous mouth solution 15 mL  15 mL Mouth/Throat Q3H PRN Nicole Kindred A, DO      . magic mouthwash  10 mL Oral QID Nickolas Madrid C, MD   10 mL at 10/22/19 2246   And  . lidocaine (XYLOCAINE) 2 % viscous mouth solution 5 mL  5 mL Mouth/Throat QID Billey Co, MD   5 mL at 10/22/19 2247  . LORazepam (ATIVAN) tablet 1 mg  1 mg Oral Once Nicole Kindred A, DO      . morphine 2 MG/ML injection 2 mg  2 mg Intravenous Q4H PRN Nicole Kindred A, DO   2 mg at 10/22/19 2344  . multivitamin with minerals tablet 1 tablet  1 tablet Oral Daily Billey Co, MD   1 tablet at 10/22/19 0826  . nicotine (NICODERM CQ - dosed in mg/24 hours) patch 21 mg  21 mg Transdermal Daily Billey Co,  MD   21 mg at 10/22/19 0834  . ondansetron (ZOFRAN) injection 4 mg  4 mg Intravenous Q6H PRN Billey Co, MD   4 mg at 10/20/19 1535  . oxyCODONE-acetaminophen (PERCOCET/ROXICET) 5-325 MG per tablet 2 tablet  2 tablet Oral Q6H Nicole Kindred A, DO   2 tablet at 10/23/19 0825  . pantoprazole (PROTONIX) injection 40 mg  40 mg Intravenous Q12H Lin Landsman, MD   40 mg at 10/22/19 2246  . polyethylene glycol (MIRALAX / GLYCOLAX) packet 17 g  17 g Oral Daily Sakai, Isami, DO      . sucralfate (CARAFATE) 1 GM/10ML suspension 1 g  1 g Oral TID WC & HS Marius Ditch, Tally Due, MD   1 g at 10/23/19 0826  . sulfamethoxazole-trimethoprim (BACTRIM DS) 800-160 MG per tablet 1 tablet  1 tablet Oral Q12H Nekeya Briski A, PA-C      . tamsulosin (FLOMAX) capsule 0.4 mg  0.4 mg Oral Daily Billey Co, MD   0.4 mg at 10/22/19 0826  . traMADol (ULTRAM) tablet 100 mg  100 mg Oral Q6H PRN Billey Co, MD   100 mg at 10/23/19 0644   Objective: Vital signs in last 24 hours: Temp:  [98.2 F (36.8 C)-98.5 F (36.9 C)] 98.2 F (36.8 C) (04/14 0747) Pulse Rate:  [91-97] 97 (04/14 0747) Resp:  [18-20] 18 (04/14 0747) BP: (126-159)/(82-93) 159/93 (04/14 0747) SpO2:  [97 %-98 %] 98 % (04/14 0747)  Intake/Output from previous day: 04/13 0701 - 04/14 0700 In: -  Out: 1100 [Urine:1100] Intake/Output this shift: Total I/O In: -  Out: 275 [Urine:275]  Physical Exam: Constitutional:  Well nourished. Alert and oriented, No acute distress.   HEENT: [place] AT, poor dentition.  Cardiovascular: No clubbing, cyanosis, or edema. Respiratory: Normal respiratory effort, no increased work of breathing. GI: Abdomen is soft, non tender, non distended, no abdominal masses.  GU: Lidocaine patch in place.  No CVA tenderness.  No bladder fullness or masses.  Patient with circumcised phallus.  Hard plaques throughout the phallus.  Foley in place.  Stent tether taped to penis.  No penile discharge. No penile lesions  or rashes. Scrotum without lesions, cysts, rashes and/or edema.   Neurologic: Grossly intact, no focal deficits, moving all 4 extremities. Psychiatric: Labile mood.    Lab Results:  Recent Labs    10/22/19 0523 10/23/19 0426  WBC 12.1* 11.7*  HGB 12.7* 12.3*  HCT 37.2* 37.3*  PLT 266 288   BMET Recent Labs    10/22/19 0523 10/23/19 0426  NA 139 141  K 3.5 3.7  CL 101 100  CO2 30 33*  GLUCOSE 208* 82  BUN 20 18  CREATININE 0.95 0.96  CALCIUM 8.4* 8.3*   Assessment & Plan: 59 year old comorbid male s/p right URS/LL/stent for management of a 5 mm distal ureteral stone.  He reports some stent discomfort which is likely exacerbated by constipation.  Request to attending for enema.  Recommend deferring oxybutynin with concerns for contributing to constipation.  Foley in place draining red urine without clots.    Foley and ureteral stent to be removed tomorrow.    Recommendations: -Bowel regimen for constipation management -B&O suppositories for relief of stent discomfort  Graceann Boileau, PA-C 10/23/2019

## 2019-10-23 NOTE — Progress Notes (Signed)
Pt appeared pale and was sleeping. Upon assessment, he was slightly difficult to arouse, BP on first assessment was 89/57 left arm, reassessment was 110/70 right arm. FSBS 109. He reports "not being able to move" and feeling "very weak". He was able to grip this RN's hands equally bilaterally, and move his legs, but is concerned he is going to "go to sleep and not wake up" and became tearful. Pt was due for 2 percocet's, held due to pt's lethargy. At 1945, pt reports "feeling like hes having a stroke" and hallucinating when his eyes are shut. No s/sx of CVA noted, speech clear, arms raised equally, no facial drooping. Pt also said his whole body is hurting. Abdomen distended and pt is reporting severe pain. Hospitalist made aware, in to see pt for assessment. New orders for CT abd/pelvis and PRN dose of IV ativan. Also discussed with hospitalist about pt's symptoms, and CIWA assessment completed due to pt's hallucinations, headache, anxiety and sweating. Pt did admit to being a frequent marijuana user, about 4-5x every day. Hospitalist notified of CIWA scale and marijuana use. Will CTM.
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
## 2019-10-23 NOTE — TOC Progression Note (Signed)
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        Expected Discharge Plan and Services                                                 Social Determinants of Health (SDOH) Interventions    Readmission Risk Interventions No flowsheet data found.

## 2019-10-23 NOTE — Progress Notes (Signed)
Patient with acute urinary retention.  Urology informed. 14 fr coude indwelling catheter placed without difficultywith cloudy, yellow urine return. Right ureteral string still secure.   Patient with initial refusal for foley catheter. After conversatio regarding past events recommendation is patient would benefit from PTSD therapy. Psychiatry eval referral done previously noted.

## 2019-10-24 DIAGNOSIS — N10 Acute tubulo-interstitial nephritis: Secondary | ICD-10-CM

## 2019-10-24 LAB — GLUCOSE, CAPILLARY
Glucose-Capillary: 69 mg/dL — ABNORMAL LOW (ref 70–99)
Glucose-Capillary: 83 mg/dL (ref 70–99)
Glucose-Capillary: 164 mg/dL — ABNORMAL HIGH (ref 70–99)
Glucose-Capillary: 81 mg/dL (ref 70–99)
Glucose-Capillary: 78 mg/dL (ref 70–99)

## 2019-10-24 LAB — CBC WITH DIFFERENTIAL/PLATELET
Abs Immature Granulocytes: 0.06 10*3/uL (ref 0.00–0.07)
Basophils Absolute: 0 10*3/uL (ref 0.0–0.1)
Basophils Relative: 0 %
Eosinophils Absolute: 0.2 10*3/uL (ref 0.0–0.5)
Eosinophils Relative: 1 %
HCT: 36.1 % — ABNORMAL LOW (ref 39.0–52.0)
Hemoglobin: 11.8 g/dL — ABNORMAL LOW (ref 13.0–17.0)
Immature Granulocytes: 0 %
Lymphocytes Relative: 18 %
Lymphs Abs: 2.6 10*3/uL (ref 0.7–4.0)
MCH: 32.1 pg (ref 26.0–34.0)
MCHC: 32.7 g/dL (ref 30.0–36.0)
MCV: 98.1 fL (ref 80.0–100.0)
Monocytes Absolute: 1.4 10*3/uL — ABNORMAL HIGH (ref 0.1–1.0)
Monocytes Relative: 10 %
Neutro Abs: 10.4 10*3/uL — ABNORMAL HIGH (ref 1.7–7.7)
Neutrophils Relative %: 71 %
Platelets: 264 10*3/uL (ref 150–400)
RBC: 3.68 MIL/uL — ABNORMAL LOW (ref 4.22–5.81)
RDW: 12 % (ref 11.5–15.5)
WBC: 14.6 10*3/uL — ABNORMAL HIGH (ref 4.0–10.5)
nRBC: 0 % (ref 0.0–0.2)

## 2019-10-24 LAB — BASIC METABOLIC PANEL
Anion gap: 9 (ref 5–15)
BUN: 18 mg/dL (ref 6–20)
CO2: 29 mmol/L (ref 22–32)
Calcium: 8 mg/dL — ABNORMAL LOW (ref 8.9–10.3)
Chloride: 97 mmol/L — ABNORMAL LOW (ref 98–111)
Creatinine, Ser: 1.08 mg/dL (ref 0.61–1.24)
GFR calc Af Amer: >60 mL/min (ref >60)
GFR calc non Af Amer: >60 mL/min (ref >60)
Glucose, Bld: 84 mg/dL (ref 70–99)
Potassium: 3.6 mmol/L (ref 3.5–5.1)
Sodium: 135 mmol/L (ref 135–145)

## 2019-10-24 LAB — LACTIC ACID, PLASMA
Lactic Acid, Venous: 1.2 mmol/L (ref 0.5–1.9)
Lactic Acid, Venous: 1 mmol/L (ref 0.5–1.9)

## 2019-10-24 LAB — PROCALCITONIN: Procalcitonin: <0.1 ng/mL

## 2019-10-24 MED ORDER — SODIUM CHLORIDE 0.9 % IV SOLN
1.0000 g | INTRAVENOUS | Status: DC
  Administered 2019-10-24 – 2019-10-25 (×2): 1 g via INTRAVENOUS
  Filled 2019-10-24 (×2): qty 1

## 2019-10-24 MED ORDER — INSULIN DETEMIR 100 UNIT/ML ~~LOC~~ SOLN
20.0000 [IU] | Freq: Two times a day (BID) | SUBCUTANEOUS | Status: DC
  Administered 2019-10-24 – 2019-10-25 (×2): 20 [IU] via SUBCUTANEOUS
  Filled 2019-10-24 (×4): qty 0.2

## 2019-10-24 MED ORDER — ARIPIPRAZOLE 5 MG PO TABS
5.0000 mg | ORAL_TABLET | Freq: Every day | ORAL | Status: DC
  Administered 2019-10-24 – 2019-10-26 (×3): 5 mg via ORAL
  Filled 2019-10-24 (×4): qty 1

## 2019-10-24 MED ORDER — SODIUM CHLORIDE 0.9 % IV SOLN: 2.0000 g | INTRAVENOUS | Status: DC

## 2019-10-24 MED ORDER — PANTOPRAZOLE SODIUM 40 MG PO TBEC
40.0000 mg | DELAYED_RELEASE_TABLET | Freq: Two times a day (BID) | ORAL | Status: DC
  Administered 2019-10-24 – 2019-10-26 (×4): 40 mg via ORAL
  Filled 2019-10-24 (×4): qty 1

## 2019-10-24 NOTE — Progress Notes (Addendum)
Urology Inpatient Progress Note  Subjective: Samuel [name] is a 59 y.o. comorbid male admitted on 10/18/2019 with right renal colic with nausea and vomiting secondary to 5 mm distal right ureteral stone.  He is s/p cystoscopy and right ureteroscopy with laser lithotripsy and right ureteral stent placement with Dr. Richardo Hanks.  UA on admission was benign.  Hospitalization has been complicated by constipation as well as development of urinary retention 2 nights ago requiring Foley catheter placement.  He underwent CTAP with contrast yesterday for evaluation of RUQ abdominal pain with findings of interval development of right-sided pyelonephritis without renal abscess.  He was started on antibiotics as below.  Additionally, Foley catheter was noted to stop draining overnight.  Bladder scan with >765mL, irrigation deferred pending urologic reevaluation.  No urine culture yet collected.  Creatinine 1.08 today.  WBC count up today, 14.6.  He remains afebrile, VSS.  Patient is somnolent today and reports diffuse abdominal pain and distention.  Anti-infectives: Anti-infectives (From admission, onward)   Start     Dose/Rate Route Frequency Ordered Stop   10/24/19 0800  cefTRIAXone (ROCEPHIN) 1 g in sodium chloride 0.9 % 100 mL IVPB     1 g 200 mL/hr over 30 Minutes Intravenous Every 24 hours 10/24/19 0637     10/24/19 0645  cefTRIAXone (ROCEPHIN) 2 g in sodium chloride 0.9 % 100 mL IVPB  Status:  Discontinued     2 g 200 mL/hr over 30 Minutes Intravenous Every 24 hours 10/24/19 0636 10/24/19 0637   10/23/19 1000  sulfamethoxazole-trimethoprim (BACTRIM DS) 800-160 MG per tablet 1 tablet  Status:  Discontinued     1 tablet Oral Every 12 hours 10/23/19 0753 10/24/19 0637   10/20/19 1100  ceFAZolin (ANCEF) IVPB 1 g/50 mL premix     1 g 100 mL/hr over 30 Minutes Intravenous  Once 10/20/19 0909 10/20/19 1134      Current Facility-Administered Medications  Medication Dose Route Frequency Provider Last Rate  Last Admin  . acetaminophen (TYLENOL) tablet 650 mg  650 mg Oral Q6H PRN [name], MD      . amLODipine (NORVASC) tablet 10 mg  10 mg Oral Daily [name], MD   10 mg at 10/23/19 1017  . cefTRIAXone (ROCEPHIN) 1 g in sodium chloride 0.9 % 100 mL IVPB  1 g Intravenous Q24H Jimmye Norman, NP 200 mL/hr at 10/24/19 0806 1 g at 10/24/19 0806  . cholecalciferol (VITAMIN D3) tablet 1,000 Units  1,000 Units Oral Daily Lurene Shadow, MD      . clopidogrel (PLAVIX) tablet 75 mg  75 mg Oral Daily [name], MD   75 mg at 10/23/19 1017  . dextrose 50 % solution 0-50 mL  0-50 mL Intravenous PRN [name], MD      . docusate sodium (COLACE) capsule 100 mg  100 mg Oral Daily Sakai, Isami, DO      . feeding supplement (GLUCERNA SHAKE) (GLUCERNA SHAKE) liquid 237 mL  237 mL Oral TID BM [name], MD   237 mL at 10/23/19 1032  . gabapentin (NEURONTIN) capsule 300 mg  300 mg Oral BID [name], MD   300 mg at 10/23/19 2218  . hydrALAZINE (APRESOLINE) injection 10 mg  10 mg Intravenous Q4H PRN [name], MD   10 mg at 10/20/19 1501  . ibuprofen (ADVIL) tablet 400 mg  400 mg Oral TID WC Esaw Grandchild A, DO   400 mg at 10/23/19 1617  .  insulin aspart (novoLOG) injection 0-9 Units  0-9 Units Subcutaneous TID WC Jennye Boroughs, MD      . insulin detemir (LEVEMIR) injection 20 Units  20 Units Subcutaneous BID Jennye Boroughs, MD      . lidocaine (LIDODERM) 5 % 1 patch  1 patch Transdermal Q24H Sakai, Isami, DO   1 patch at 10/23/19 1032  . lidocaine (XYLOCAINE) 2 % viscous mouth solution 15 mL  15 mL Mouth/Throat Q3H PRN Nicole Kindred A, DO      . magic mouthwash  10 mL Oral QID Billey Co, MD   10 mL at 10/23/19 2217   And  . lidocaine (XYLOCAINE) 2 % viscous mouth solution 5 mL  5 mL Mouth/Throat QID Billey Co, MD   5 mL at 10/23/19 2216  . LORazepam (ATIVAN) injection 1 mg  1 mg Intravenous Q6H PRN Lang Snow, NP   1 mg at  10/24/19 0754  . LORazepam (ATIVAN) tablet 1 mg  1 mg Oral Once Nicole Kindred A, DO      . morphine 2 MG/ML injection 2 mg  2 mg Intravenous Q4H PRN Nicole Kindred A, DO   2 mg at 10/22/19 2344  . multivitamin with minerals tablet 1 tablet  1 tablet Oral Daily Billey Co, MD   1 tablet at 10/23/19 1017  . nicotine (NICODERM CQ - dosed in mg/24 hours) patch 21 mg  21 mg Transdermal Daily Billey Co, MD   21 mg at 10/23/19 1030  . nystatin (MYCOSTATIN) 100000 UNIT/ML suspension 500,000 Units  5 mL Mouth/Throat TID Lin Landsman, MD   500,000 Units at 10/23/19 2216  . ondansetron (ZOFRAN) injection 4 mg  4 mg Intravenous Q6H PRN Billey Co, MD   4 mg at 10/20/19 1535  . oxyCODONE-acetaminophen (PERCOCET/ROXICET) 5-325 MG per tablet 2 tablet  2 tablet Oral Q6H Nicole Kindred A, DO   2 tablet at 10/24/19 [phone]  . pantoprazole (PROTONIX) injection 40 mg  40 mg Intravenous Q12H Lin Landsman, MD   40 mg at 10/23/19 2218  . polyethylene glycol (MIRALAX / GLYCOLAX) packet 17 g  17 g Oral Daily Sakai, Isami, DO      . sucralfate (CARAFATE) 1 GM/10ML suspension 1 g  1 g Oral TID WC & HS Marius Ditch, Tally Due, MD   1 g at 10/23/19 2216  . tamsulosin (FLOMAX) capsule 0.4 mg  0.4 mg Oral Daily Billey Co, MD   0.4 mg at 10/23/19 1017  . traMADol (ULTRAM) tablet 100 mg  100 mg Oral Q6H PRN Billey Co, MD   100 mg at 10/23/19 1617  . vitamin B-12 (CYANOCOBALAMIN) tablet 500 mcg  500 mcg Oral Daily Jennye Boroughs, MD       Objective: Vital signs in last 24 hours: Temp:  [97.8 F (36.6 C)-98.5 F (36.9 C)] 98.5 F (36.9 C) (04/15 0722) Pulse Rate:  [85-95] 95 (04/15 0722) Resp:  [16-18] 18 (04/15 0722) BP: (105-129)/(62-77) 129/77 (04/15 0722) SpO2:  [92 %-98 %] 92 % (04/15 0722)  Intake/Output from previous day: 04/14 0701 - 04/15 0700 In: 1035 [P.O.:960] Out: 475 [Urine:475] Intake/Output this shift: No intake/output data recorded.  Physical Exam Vitals  and nursing note reviewed.  Constitutional:      General: He is not in acute distress.    Appearance: He is ill-appearing. He is not toxic-appearing or diaphoretic.  HENT:     Head: Normocephalic and atraumatic.  Abdominal:  General: There is distension.     Tenderness: There is abdominal tenderness. There is no guarding or rebound.  Genitourinary:    Comments: 3 French Foley catheter in place with detached StatLock.  Drainage tubing dry with scant grossly bloody output in his night bag.  No clot visualized.  Stent tether visualized protruding from the urethral meatus. Skin:    General: Skin is warm and dry.  Neurological:     Mental Status: He is oriented to person, place, and time.    Lab Results:  Recent Labs    10/23/19 0426 10/24/19 0653  WBC 11.7* 14.6*  HGB 12.3* 11.8*  HCT 37.3* 36.1*  PLT 288 264   BMET Recent Labs    10/23/19 0426 10/24/19 0653  NA 141 135  K 3.7 3.6  CL 100 97*  CO2 33* 29  GLUCOSE 82 84  BUN 18 18  CREATININE 0.96 1.08  CALCIUM 8.3* 8.0*   Studies/Results: CT ABDOMEN PELVIS W CONTRAST  Result Date: 10/23/2019 CLINICAL DATA:  Right upper quadrant abdominal pain EXAM: CT ABDOMEN AND PELVIS WITH CONTRAST TECHNIQUE: Multidetector CT imaging of the abdomen and pelvis was performed using the standard protocol following bolus administration of intravenous contrast. CONTRAST:  OMNIPAQUE IOHEXOL 300 MG/ML  SOLN COMPARISON:  10/18/2019 FINDINGS: Lower chest: Hypoventilatory changes are seen within the dependent lower lobes. Interval development of trace right pleural effusion. Esophageal wall thickening seen previously has resolved, previously likely representing under distension. Hepatobiliary: No focal liver abnormality is seen. No gallstones, gallbladder wall thickening, or biliary dilatation. Pancreas: Unremarkable. No pancreatic ductal dilatation or surrounding inflammatory changes. Spleen: Normal in size without focal abnormality.  Adrenals/Urinary Tract: Interval placement of a right ureteral stent. There as been passage or removal of the distal right ureteral calculus seen previously. Interval development of heterogeneous decreased enhancement within the upper pole and lateral interpolar aspect right kidney, worrisome for pyelonephritis. No evidence of renal abscess. The left kidney enhances normally. Bladder is decompressed with a Foley catheter. Adrenals are unremarkable. Stomach/Bowel: Continue distension of the stomach. No bowel obstruction or ileus. Normal appendix right lower quadrant. No bowel wall thickening or inflammatory change. Vascular/Lymphatic: Aortic atherosclerosis. No pathologic adenopathy. Reproductive: Prostate is unremarkable. Stable penile corporal calcifications. Other: Postsurgical changes from prior left inguinal hernia repair. No free fluid or free gas. Musculoskeletal: No acute or destructive bony lesions. Reconstructed images demonstrate no additional findings. IMPRESSION: 1. Interval passage or removal of the right ureteral calculus seen previously, now with a ureteral stent in place. 2. Interval development of right-sided pyelonephritis. No renal abscess. 3. Trace right pleural effusion. Electronically Signed   By: Sharlet Salina M.D.   On: 10/23/2019 21:21   Assessment & Plan: 59 year old comorbid male s/p right URS/LL/stent placement for management of a 5 mm ureteral stone, hospitalization complicated by constipation, urinary retention, and now findings of interval development of right-sided pyelonephritis.  I suspect urinary retention is secondary to constipation.  I removed the patient's 14 French Foley catheter at the bedside today.  No clot material visualized at the catheter tip.  Next, I removed his right ureteral stent without difficulty.  Patient tolerated well.  Next, I elected to replace the patient's Foley catheter at the bedside today due to concerns for possible clot retention.  The  patient was cleaned and prepped in a sterile fashion with Betadine and 2% lidocaine jelly was instilled into the urethra.  I inserted an 39 French coud Foley catheter into the bladder without difficulty.  Patient  tolerated well.  I immediately noted brisk flow of turbid, amber urine.  I elected to flush the Foley catheter with 60 cc of sterile water without difficulty.  No clot material visualized.  Based on these findings, I suspect original Foley catheter was malpositioned in the prostate causing bleeding and decreased urinary flow.  Counseled patient not to pull on the new Foley catheter.  Recommendations: -Continue bowel regimen -Collect urine sample for culture today -Continue empiric antibiotics, patient will need 14 days of culture appropriate therapy per culture results -Keep Foley today with voiding trial overnight: pull Foley at 0100 and monitor patient for urinary output with serial bladder scans to ensure adequate emptying.  Carman Ching, PA-C 10/24/2019

## 2019-10-24 NOTE — Progress Notes (Addendum)
Bladder scanned patient got >719. NP notified waiting for order to irrigate.    Edit* NP would prefer [place] to wait for Urology prior to irrigation.

## 2019-10-24 NOTE — Progress Notes (Signed)
Patient had minimal documented urine output in the last 24 hours. Output is red, bloody. NP notified. Continues to report R sided flank pain. Orders received to hold Lovenox.

## 2019-10-24 NOTE — Progress Notes (Signed)
PHARMACIST - PHYSICIAN COMMUNICATION  DR:   Myriam Forehand  CONCERNING: IV to Oral Route Change Policy  RECOMMENDATION: This patient is receiving Pantoprazole by the intravenous route.  Based on criteria approved by the Pharmacy and Therapeutics Committee, the intravenous medication(s) is/are being converted to the equivalent oral dose form(s).   DESCRIPTION: These criteria include:  The patient is eating (either orally or via tube) and/or has been taking other orally administered medications for a least 24 hours  The patient has no evidence of active gastrointestinal bleeding or impaired GI absorption (gastrectomy, short bowel, patient on TNA or NPO).  If you have questions about this conversion, please contact the Pharmacy Department  []   [phone] )  ( [phone] []   [phone] )  [facility] []   [phone] )  [facility] []   [phone] )  [facility] []   [phone] )  [facility]   ( [phone], [facility] 10/24/2019 12:25 PM

## 2019-10-24 NOTE — Progress Notes (Signed)
   10/24/19 1115  Clinical Encounter Type  Visited With Patient;Other (Comment)  Visit Type Follow-up  Referral From Chaplain  Consult/Referral To Chaplain  While rounding Chaplain went in to check on patient. His nurse was preparing his meds. After taking his medicine patient explained that he needs a place to live. Chaplain said "I thought your nephew found you a place to live." At the advice of his half brother, patient decided to stay in this area because it is close to where he receives medical care. He mentioned several other concerns and Chaplain told him that she would speak with Child psychotherapist.   Chaplain talked to his Child psychotherapist, Delilah, explaining some of patient's concerns. The main concern was housing, but Delilah was under the impression that he had housing situated. Together Delilah and Chaplain went to talk with Mr. Gunner. When they entered the room, his doctor was talking to him, so they waited. After his doctor left, Delilah mentioned housing and patient explained that the place his nephew found was far from his medical treatment. Delilah mentioned that medicare offers transportation. Patient said that he applied in December and has not heard back. Delilah explained that before a ride could be set up he would need to call to find out status of his case. Delilah offered to give patient the number, but explained that she could not make the call for him. Delilah also explained that there maybe low income housing near, but he would need to apply. He said someone told him about low income housing, but explained there is a waiting list. Delilah said there probably is a waiting list, but he could at least call and find out what he needs to do to get on the list and again she cannot make the call for him. After Delilah gave told him she would get telephone numbers for him, Chaplain encouraged him to make the necessary calls to find out what his next steps will be. Chaplain and Delilah left.  Chaplain later saw Delilah in the hall and she informed Chaplain that she took patient the needed telephone numbers.

## 2019-10-24 NOTE — Progress Notes (Addendum)
    BRIEF OVERNIGHT PROGRESS REPORT    SUBJECTIVE: Patient c/o abdominal distension and worsening RUQ abdominal and flank pain associated with nausea and malaise. He is also noted with hematuria and decreased urine output.  OBJECTIVE:On arrival to the bedside, he was afebrile with blood pressure 115/69 mm Hg and pulse rate 81 beats/min. There were no focal neurological deficits; he was alert and oriented x4, and he did not demonstrate any memory deficits. RUQ tenderness noted on palpation and distended abdomen.  BRIEF PATIENT DESCRIPTION:  59 y.o male with PMH of CAD s/p stent, DM on insulin, Neuropathy, HTN and right sided nephrolithiasis admitted on 4/9 with right renal colic with n/v s/p cystoscopy an right ureteroscopy and right stent placement.  ASSESSMENT/PLAN: 1. Ureterolithiasis- s/p cystoscopy and right ureteroscopy with laser lithotripsy and right ureteral stent placement on 4/11. - Now with Hematuria and persistent RUQ and flank pain - Repeat CT abdomen/pelvis with contrast showing interval development of  Right sided pyelonephritis - Check Procalcitonin and Urine culture - Dc Bactrim and start Ceftriaxone, may need to broaden further if patient becomes symptomatic (febrile, worsening leukocytosis and procalcitonin) - Hold Anticoagulation   ADDENDUM: Bladder scan showing >700 cc of urine in the bladder. He has foley that is not draining well likely clotted due to hematuria? Will need Urology ok to irrigate ASAP. Attending notified via secure chat message.  Webb Silversmith, DNP, CCRN, FNP-C [facility] Nurse Practitioner
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
## 2019-10-24 NOTE — Progress Notes (Addendum)
Inpatient Diabetes Program Recommendations  AACE/ADA: New Consensus Statement on Inpatient Glycemic Control   Target Ranges:  Prepandial:   less than 140 mg/dL      Peak postprandial:   less than 180 mg/dL (1-2 hours)      Critically ill patients:  140 - 180 mg/dL  Results for JOHNATON, SONNEBORN (MRN [id number]) as of 10/24/2019 07:37  Ref. Range 10/24/2019 06:49 10/24/2019 07:24  Glucose-Capillary Latest Ref Range: 70 - 99 mg/dL 83 69 (L)   Results for NAVRAJ, DREIBELBIS (MRN [id number]) as of 10/24/2019 07:37  Ref. Range 10/23/2019 07:34 10/23/2019 11:56 10/23/2019 13:26 10/23/2019 15:18 10/23/2019 16:57 10/23/2019 17:50 10/23/2019 19:28 10/23/2019 21:36 10/23/2019 22:27  Glucose-Capillary Latest Ref Range: 70 - 99 mg/dL 782 (H) 956 (H) 213 (H) 116 (H) 113 (H) 94 109 (H) 155 (H) 161 (H)   Review of Glycemic Control  Outpatient Diabetes medications:Levemir 25 units BID Current orders for Inpatient glycemic control:Levemir 25 units BID, Novolog 0-9 units TID with meals  Inpatient Diabetes Program Recommendations:   Insulin-Basal: Fasting glucose 69 mg/dl today.  Please consider changing Levemir to 25 units QAM and Levemir 23 units QHS.  Thanks, Orlando Penner, RN, MSN, CDE Diabetes Coordinator Inpatient Diabetes Program [phone] (Team Pager from 8am to 5pm)

## 2019-10-24 NOTE — Progress Notes (Addendum)
Progress Note    Samuel [name]  AJO:[id number] DOB: Dec 26, 1960  DOA: 10/18/2019 PCP: [facility]      Brief Narrative:    Medical records reviewed and are as summarized below:  Samuel [name] is an 59 y.o. male with history of coronary disease status post stent x1 in 2019, diabetes mellitus type 2 on insulin therapy complicated by DM neuropathy, essential hypertension and history of right-sided nephrolithiasis status post stent in the past. Patient is a resident of [place], and relocated to [place] area. Presented on 4/9 with intractable nausea and vomiting, right flank pain.  In the ED, glucose 900.  Patient had missed some of his insulin since moving.    Imaging studies did reveal right-sided nephrolithiasis with a 49mm distal ureteral stone. Admitted to hospitalist service for admission and further management.      Assessment/Plan:   Principal Problem:   Ureterolithiasis Active Problems:   Hyperglycemic crisis in diabetes mellitus (HCC)   Psychosis (HCC)   Acute pyelonephritis   Body mass index is 28.44 kg/m.   Abdominal wall pain with swelling/constipation. -no acute abnormality noted on CT abdomen and pelvis.  Patient has been evaluated by general surgeon and he has signed off because there are no surgical issues at this time.   Mood lability, depression  Start Abilify per psychiatrist recommendation.     Possible esophageal food impaction/erosive esophagitis- GI consulted .  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  Potassium hydroxide test was negative for any fungal elements.  Pathology report showed antral and oxyntic mucosa with reactive gastritis.  Negative for H. pylori, dysplasia and malignancy.  Right pyelonephritis and worsening leukocytosis Patient has been started on IV Rocephin.  Follow-up urine cultures.  Ureterolithiasis, right-sided -patient with severe right flank pain  radiating to the groin.  Urology consulted.  Underwent cystoscopy and right ureteroscopy with laser lithotripsy and right ureteral stent placement on 4/11.  Right ureteral stent was removed on 10/24/2019. --Continue Flomax --Analgesics as needed.  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  Fairly uncontrolled , A1c 15.5%!!   --Continue Levemir (dose decreased from 25 units twice daily to 20 units twice daily) and NovoLog sliding scale.  Dose of sliding scale has been decreased because of hypoglycemic episode on 10/22/2019. --Continue gabapentin  Mouth pain, most likely oral thrush (has history) --Magic mouthwash with lidocaine qid --added viscous lidocaine for in between doses  Coronary artery disease, with prior stent.  Stable and no active chest pain.  Continue Plavix.  Vitamin D deficiency:  Continue vitamin D supplement.  Borderline low vitamin B12:  Continue vitamin B12 supplement.   Family Communication/Anticipated D/C date and plan/Code Status   DVT prophylaxis: Lovenox Code Status: Full code Family Communication: Plan discussed with the patient Disposition Plan:    Status is: Inpatient  Remains inpatient appropriate because:Inpatient level of care appropriate due to severity of illness   Dispo: The patient is from: Home              Anticipated d/c is to: Home              Anticipated d/c date is: 2 days              Patient currently is not medically stable to d/c.  Subjective:   Overnight events noted.  He complains of right flank pain and right mid back pain.  He said there was greenish pus coming from the penis after Foley catheter was removed earlier today.  He is still worried about his blood sugars and hallucinations.  Objective:    Vitals:   10/23/19 2230 10/23/19  2310 10/24/19 0722 10/24/19 1615  BP:  115/69 129/77 130/83  Pulse:  90 95 (!) 102  Resp:  16 18 18   Temp: 98.2 F (36.8 C) 98.4 F (36.9 C) 98.5 F (36.9 C) 98.3 F (36.8 C)  TempSrc: Oral Oral Oral Oral  SpO2:  98% 92% 94%  Weight:      Height:        Intake/Output Summary (Last 24 hours) at 10/24/2019 1713 Last data filed at 10/24/2019 1022 Gross per 24 hour  Intake 940 ml  Output 1000 ml  Net -60 ml   Filed Weights   10/18/19 1652 10/20/19 0040  Weight: 95.3 kg 89.9 kg    Exam:  GEN: NAD SKIN: No rash EYES: EOMI ENT: MMM CV: RRR PULM: CTA B ABD: soft, mildly distended, NT, +BS CNS: AAO x 3, non focal EXT: No edema or tenderness GU: Foley catheter with dark urine.  Right CVA tenderness   Data Reviewed:   I have personally reviewed following labs and imaging studies:  Labs: Labs show the following:   Basic Metabolic Panel: Recent Labs  Lab 10/20/19 0813 10/20/19 0813 10/21/19 0715 10/21/19 0715 10/22/19 0523 10/22/19 0523 10/23/19 0426 10/24/19 0653  NA 137  --  131*  --  139  --  141 135  K 3.0*   < > 4.3   < > 3.5   < > 3.7 3.6  CL 99  --  95*  --  101  --  100 97*  CO2 29  --  25  --  30  --  33* 29  GLUCOSE 156*  --  321*  --  208*  --  82 84  BUN 17  --  21*  --  20  --  18 18  CREATININE 0.97  --  1.16  --  0.95  --  0.96 1.08  CALCIUM 8.3*  --  8.3*  --  8.4*  --  8.3* 8.0*   < > = values in this interval not displayed.   GFR Estimated Creatinine Clearance: 86.2 mL/min (by C-G formula based on SCr of 1.08 mg/dL). Liver Function Tests: Recent Labs  Lab 10/18/19 1707  AST 22  ALT 16  ALKPHOS 143*  BILITOT 1.5*  PROT 8.3*  ALBUMIN 4.0   Recent Labs  Lab 10/18/19 1707 10/20/19 0813  LIPASE 18 11   No results for input(s): AMMONIA in the last 168 hours. Coagulation profile No results for input(s): INR, PROTIME in the last 168 hours.  CBC: Recent Labs  Lab 10/20/19 0813 10/21/19 0715 10/22/19 0523 10/23/19 0426  10/24/19 0653  WBC 17.8* 14.4* 12.1* 11.7* 14.6*  NEUTROABS 14.3* 12.0* 9.3* 7.5 10.4*  HGB 13.7 14.0 12.7* 12.3* 11.8*  HCT 40.3 39.0 37.2* 37.3* 36.1*  MCV 94.4 89.9 95.4 97.4 98.1  PLT 287 319 266 288 264   Cardiac Enzymes: No results for input(s): CKTOTAL, CKMB, CKMBINDEX, TROPONINI in the last 168 hours. BNP (last 3 results) No results for input(s): PROBNP in the last 8760 hours. CBG: Recent Labs  Lab 10/23/19 2227 10/24/19 0649 10/24/19 0724 10/24/19 1222 10/24/19 1616  GLUCAP 161* 83  69* 164* 81   D-Dimer: No results for input(s): DDIMER in the last 72 hours. Hgb A1c: No results for input(s): HGBA1C in the last 72 hours. Lipid Profile: No results for input(s): CHOL, HDL, LDLCALC, TRIG, CHOLHDL, LDLDIRECT in the last 72 hours. Thyroid function studies: No results for input(s): TSH, T4TOTAL, T3FREE, THYROIDAB in the last 72 hours.  Invalid input(s): FREET3 Anemia work up: Recent Labs    10/23/19 0426  VITAMINB12 296   Sepsis Labs: Recent Labs  Lab 10/20/19 0813 10/20/19 0813 10/20/19 1114 10/21/19 0715 10/22/19 0523 10/23/19 0426 10/24/19 0653 10/24/19 0926  PROCALCITON  --   --   --   --   --   --  <0.10  --   WBC 17.8*   < >  --  14.4* 12.1* 11.7* 14.6*  --   LATICACIDVEN 0.9  --  1.2  --   --   --  1.2 1.0   < > = values in this interval not displayed.    Microbiology Recent Results (from the past 240 hour(s))  Respiratory Panel by RT PCR (Flu A&B, Covid) - Nasopharyngeal Swab     Status: None   Collection Time: 10/18/19  6:31 PM   Specimen: Nasopharyngeal Swab  Result Value Ref Range Status   SARS Coronavirus 2 by RT PCR NEGATIVE NEGATIVE Final    Comment: (NOTE) SARS-CoV-2 target nucleic acids are NOT DETECTED. The SARS-CoV-2 RNA is generally detectable in upper respiratoy specimens during the acute phase of infection. The lowest concentration of SARS-CoV-2 viral copies this assay can detect is 131 copies/mL. A negative result does not  preclude SARS-Cov-2 infection and should not be used as the sole basis for treatment or other patient management decisions. A negative result may occur with  improper specimen collection/handling, submission of specimen other than nasopharyngeal swab, presence of viral mutation(s) within the areas targeted by this assay, and inadequate number of viral copies (<131 copies/mL). A negative result must be combined with clinical observations, patient history, and epidemiological information. The expected result is Negative. Fact Sheet for Patients:  https://www.moore.com/ Fact Sheet for Healthcare Providers:  https://www.young.biz/ This test is not yet ap proved or cleared by the [place] FDA and  has been authorized for detection and/or diagnosis of SARS-CoV-2 by FDA under an Emergency Use Authorization (EUA). This EUA will remain  in effect (meaning this test can be used) for the duration of the COVID-19 declaration under Section 564(b)(1) of the Act, 21 U.S.C. section 360bbb-3(b)(1), unless the authorization is terminated or revoked sooner.    Influenza A by PCR NEGATIVE NEGATIVE Final   Influenza B by PCR NEGATIVE NEGATIVE Final    Comment: (NOTE) The Xpert Xpress SARS-CoV-2/FLU/RSV assay is intended as an aid in  the diagnosis of influenza from Nasopharyngeal swab specimens and  should not be used as a sole basis for treatment. Nasal washings and  aspirates are unacceptable for Xpert Xpress SARS-CoV-2/FLU/RSV  testing. Fact Sheet for Patients: https://www.moore.com/ Fact Sheet for Healthcare Providers: https://www.young.biz/ This test is not yet approved or cleared by the [place] FDA and  has been authorized for detection and/or diagnosis of SARS-CoV-2 by  FDA under an Emergency Use Authorization (EUA). This EUA will remain  in effect (meaning this test can be used) for the duration of the    Covid-19 declaration under Section 564(b)(1) of the Act, 21  U.S.C. section 360bbb-3(b)(1), unless the authorization is  terminated or revoked. Performed at [facility], [address] [place], [place] [address]   CULTURE, BLOOD (ROUTINE X 2) w Reflex to ID Panel     Status: None (Preliminary result)   Collection Time: 10/20/19  8:23 AM   Specimen: BLOOD  Result Value Ref Range Status   Specimen Description BLOOD BLOOD RIGHT WRIST  Final   Special Requests   Final    BOTTLES DRAWN AEROBIC AND ANAEROBIC Blood Culture adequate volume   Culture   Final    NO GROWTH 4 DAYS Performed at [facility], [address], [place], [place] [address]    Report Status PENDING  Incomplete  CULTURE, BLOOD (ROUTINE X 2) w Reflex to ID Panel     Status: None (Preliminary result)   Collection Time: 10/20/19 11:14 AM   Specimen: BLOOD  Result Value Ref Range Status   Specimen Description BLOOD LEFT ANTECUBITAL  Final   Special Requests   Final    BOTTLES DRAWN AEROBIC AND ANAEROBIC Blood Culture adequate volume   Culture   Final    NO GROWTH 4 DAYS Performed at [facility], [address] [place], [place] [address]    Report Status PENDING  Incomplete  KOH prep     Status: None   Collection Time: 10/21/19  3:16 PM   Specimen: Esophagus  Result Value Ref Range Status   Specimen Description ESOPHAGUS  Final   Special Requests Normal  Final   KOH Prep   Final    NO YEAST OR FUNGAL ELEMENTS SEEN Performed at [facility], [address] [place], [place] [address]    Report Status 10/21/2019 FINAL  Final    Procedures and diagnostic studies:  CT ABDOMEN PELVIS W CONTRAST  Result Date: 10/23/2019 CLINICAL DATA:  Right upper quadrant abdominal pain EXAM: CT ABDOMEN AND PELVIS WITH CONTRAST TECHNIQUE: Multidetector CT imaging of the abdomen and pelvis was performed using the standard protocol following bolus administration of intravenous contrast.  CONTRAST:  OMNIPAQUE IOHEXOL 300 MG/ML  SOLN COMPARISON:  10/18/2019 FINDINGS: Lower chest: Hypoventilatory changes are seen within the dependent lower lobes. Interval development of trace right pleural effusion. Esophageal wall thickening seen previously has resolved, previously likely representing under distension. Hepatobiliary: No focal liver abnormality is seen. No gallstones, gallbladder wall thickening, or biliary dilatation. Pancreas: Unremarkable. No pancreatic ductal dilatation or surrounding inflammatory changes. Spleen: Normal in size without focal abnormality. Adrenals/Urinary Tract: Interval placement of a right ureteral stent. There as been passage or removal of the distal right ureteral calculus seen previously. Interval development of heterogeneous decreased enhancement within the upper pole and lateral interpolar aspect right kidney, worrisome for pyelonephritis. No evidence of renal abscess. The left kidney enhances normally. Bladder is decompressed with a Foley catheter. Adrenals are unremarkable. Stomach/Bowel: Continue distension of the stomach. No bowel obstruction or ileus. Normal appendix right lower quadrant. No bowel wall thickening or inflammatory change. Vascular/Lymphatic: Aortic atherosclerosis. No pathologic adenopathy. Reproductive: Prostate is unremarkable. Stable penile corporal calcifications. Other: Postsurgical changes from prior left inguinal hernia repair. No free fluid or free gas. Musculoskeletal: No acute or destructive bony lesions. Reconstructed images demonstrate no additional findings. IMPRESSION: 1. Interval passage or removal of the right ureteral calculus seen previously, now with a ureteral stent in place. 2. Interval development of right-sided pyelonephritis. No renal abscess. 3. Trace right pleural effusion. Electronically Signed   By: Sharlet Salina M.D.   On: 10/23/2019 21:21    Medications:   . amLODipine  10 mg Oral Daily  . ARIPiprazole  5 mg Oral  Daily  . cholecalciferol  1,000 Units Oral Daily  . clopidogrel  75 mg Oral Daily  . docusate sodium  100 mg Oral Daily  . feeding supplement (GLUCERNA SHAKE)  237 mL Oral TID BM  . gabapentin  300 mg Oral BID  . ibuprofen  400 mg Oral TID WC  . insulin aspart  0-9 Units Subcutaneous TID WC  . insulin detemir  20 Units Subcutaneous BID  . lidocaine  1 patch Transdermal Q24H  . magic mouthwash  10 mL Oral QID   And  . lidocaine  5 mL Mouth/Throat QID  . LORazepam  1 mg Oral Once  . multivitamin with minerals  1 tablet Oral Daily  . nicotine  21 mg Transdermal Daily  . nystatin  5 mL Mouth/Throat TID  . oxyCODONE-acetaminophen  2 tablet Oral Q6H  . pantoprazole  40 mg Oral BID  . polyethylene glycol  17 g Oral Daily  . sucralfate  1 g Oral TID WC & HS  . tamsulosin  0.4 mg Oral Daily  . vitamin B-12  500 mcg Oral Daily   Continuous Infusions: . cefTRIAXone (ROCEPHIN)  IV 1 g (10/24/19 0806)     LOS: 6 days   Rishaan Gunner  [facility]     10/24/2019, 5:13 PM

## 2019-10-24 NOTE — Plan of Care (Signed)
  Problem: Education: Goal: Knowledge of General Education information will improve Description: Including pain rating scale, medication(s)/side effects and non-pharmacologic comfort measures Outcome: Progressing   Problem: Health Behavior/Discharge Planning: Goal: Ability to manage health-related needs will improve Outcome: Progressing   Problem: Clinical Measurements: Goal: Ability to maintain clinical measurements within normal limits will improve Outcome: Progressing Goal: Will remain free from infection Outcome: Progressing   Problem: Activity: Goal: Risk for activity intolerance will decrease Outcome: Progressing   Problem: Nutrition: Goal: Adequate nutrition will be maintained Outcome: Progressing   Problem: Elimination: Goal: Will not experience complications related to urinary retention Outcome: Progressing   Problem: Pain Managment: Goal: General experience of comfort will improve Outcome: Progressing   Problem: Safety: Goal: Ability to remain free from injury will improve Outcome: Progressing   Problem: Skin Integrity: Goal: Risk for impaired skin integrity will decrease Outcome: Progressing   

## 2019-10-25 LAB — CBC WITH DIFFERENTIAL/PLATELET
Abs Immature Granulocytes: 0.08 10*3/uL — ABNORMAL HIGH (ref 0.00–0.07)
Basophils Absolute: 0 10*3/uL (ref 0.0–0.1)
Basophils Relative: 0 %
Eosinophils Absolute: 0.2 10*3/uL (ref 0.0–0.5)
Eosinophils Relative: 2 %
HCT: 31.6 % — ABNORMAL LOW (ref 39.0–52.0)
Hemoglobin: 10.8 g/dL — ABNORMAL LOW (ref 13.0–17.0)
Immature Granulocytes: 1 %
Lymphocytes Relative: 23 %
Lymphs Abs: 2.7 10*3/uL (ref 0.7–4.0)
MCH: 32.4 pg (ref 26.0–34.0)
MCHC: 34.2 g/dL (ref 30.0–36.0)
MCV: 94.9 fL (ref 80.0–100.0)
Monocytes Absolute: 1.1 10*3/uL — ABNORMAL HIGH (ref 0.1–1.0)
Monocytes Relative: 9 %
Neutro Abs: 7.7 10*3/uL (ref 1.7–7.7)
Neutrophils Relative %: 65 %
Platelets: 253 10*3/uL (ref 150–400)
RBC: 3.33 MIL/uL — ABNORMAL LOW (ref 4.22–5.81)
RDW: 12 % (ref 11.5–15.5)
WBC: 11.8 10*3/uL — ABNORMAL HIGH (ref 4.0–10.5)
nRBC: 0 % (ref 0.0–0.2)

## 2019-10-25 LAB — CULTURE, BLOOD (ROUTINE X 2)
Culture: NO GROWTH
Culture: NO GROWTH
Special Requests: ADEQUATE
Special Requests: ADEQUATE

## 2019-10-25 LAB — CALCULI, WITH PHOTOGRAPH (CLINICAL LAB)
Calcium Oxalate Dihydrate: 10 %
Calcium Oxalate Monohydrate: 90 %
Size Calculi: <1 mm
Weight Calculi: <1 mg

## 2019-10-25 LAB — GLUCOSE, CAPILLARY
Glucose-Capillary: 190 mg/dL — ABNORMAL HIGH (ref 70–99)
Glucose-Capillary: 331 mg/dL — ABNORMAL HIGH (ref 70–99)
Glucose-Capillary: 148 mg/dL — ABNORMAL HIGH (ref 70–99)
Glucose-Capillary: 190 mg/dL — ABNORMAL HIGH (ref 70–99)

## 2019-10-25 LAB — URINE CULTURE: Culture: NO GROWTH

## 2019-10-25 LAB — PROCALCITONIN: Procalcitonin: <0.1 ng/mL

## 2019-10-25 MED ORDER — INSULIN DETEMIR 100 UNIT/ML ~~LOC~~ SOLN
15.0000 [IU] | Freq: Two times a day (BID) | SUBCUTANEOUS | Status: DC
  Administered 2019-10-25 – 2019-10-26 (×2): 15 [IU] via SUBCUTANEOUS
  Filled 2019-10-25 (×4): qty 0.15

## 2019-10-25 MED ORDER — ENSURE MAX PROTEIN PO LIQD
11.0000 [oz_av] | Freq: Two times a day (BID) | ORAL | Status: DC
  Administered 2019-10-26 (×2): 11 [oz_av] via ORAL
  Filled 2019-10-25: qty 330

## 2019-10-25 NOTE — Progress Notes (Signed)
Urology Inpatient Progress Note  Subjective: Samuel [name] is a 59 y.o. comorbid male admitted on 10/18/2019 with right renal colic with nausea and vomiting secondary to 5 mm distal right ureteral stone.  He is s/p cystoscopy and right ureteroscopy with laser lithotripsy and right ureteral stent placement with Dr. Richardo Hanks.  UA on admission was benign.  Hospitalization was complicated by constipation as well as development of urinary retention requiring Foley catheter placement and subsequent replacement with nondraining Foley catheter.  Ureteral stent removed at the bedside yesterday.  Foley catheter discontinued overnight for inpatient voiding trial today.  Of note, patient underwent repeat CTAP with contrast 2 days ago with radiology report of interval development of right-sided pyelonephritis without renal abscess.  Patient had a bowel movement and has urinated this morning.  Bladder scan with 38mL per nursing.  Patient appears in significantly less distress today, is ambulatory and comfortable appearing.  WBC count down today, 11.8.  Urine culture pending.  Anti-infectives: Anti-infectives (From admission, onward)   Start     Dose/Rate Route Frequency Ordered Stop   10/24/19 0800  cefTRIAXone (ROCEPHIN) 1 g in sodium chloride 0.9 % 100 mL IVPB     1 g 200 mL/hr over 30 Minutes Intravenous Every 24 hours 10/24/19 0637     10/24/19 0645  cefTRIAXone (ROCEPHIN) 2 g in sodium chloride 0.9 % 100 mL IVPB  Status:  Discontinued     2 g 200 mL/hr over 30 Minutes Intravenous Every 24 hours 10/24/19 0636 10/24/19 0637   10/23/19 1000  sulfamethoxazole-trimethoprim (BACTRIM DS) 800-160 MG per tablet 1 tablet  Status:  Discontinued     1 tablet Oral Every 12 hours 10/23/19 0753 10/24/19 0637   10/20/19 1100  ceFAZolin (ANCEF) IVPB 1 g/50 mL premix     1 g 100 mL/hr over 30 Minutes Intravenous  Once 10/20/19 0909 10/20/19 1134      Current Facility-Administered Medications  Medication Dose Route  Frequency Provider Last Rate Last Admin  . acetaminophen (TYLENOL) tablet 650 mg  650 mg Oral Q6H PRN [name], MD      . amLODipine (NORVASC) tablet 10 mg  10 mg Oral Daily [name], MD   10 mg at 10/25/19 1006  . ARIPiprazole (ABILIFY) tablet 5 mg  5 mg Oral Daily Lurene Shadow, MD   5 mg at 10/25/19 1007  . cefTRIAXone (ROCEPHIN) 1 g in sodium chloride 0.9 % 100 mL IVPB  1 g Intravenous Q24H Jimmye Norman, NP 200 mL/hr at 10/25/19 1018 1 g at 10/25/19 1018  . cholecalciferol (VITAMIN D3) tablet 1,000 Units  1,000 Units Oral Daily Lurene Shadow, MD   1,000 Units at 10/25/19 0841  . clopidogrel (PLAVIX) tablet 75 mg  75 mg Oral Daily [name], MD   75 mg at 10/25/19 0842  . dextrose 50 % solution 0-50 mL  0-50 mL Intravenous PRN [name], MD      . docusate sodium (COLACE) capsule 100 mg  100 mg Oral Daily Sakai, Isami, DO   100 mg at 10/25/19 0842  . feeding supplement (GLUCERNA SHAKE) (GLUCERNA SHAKE) liquid 237 mL  237 mL Oral TID BM [name], MD   237 mL at 10/25/19 0858  . gabapentin (NEURONTIN) capsule 300 mg  300 mg Oral BID [name], MD   300 mg at 10/25/19 0842  . hydrALAZINE (APRESOLINE) injection 10 mg  10 mg Intravenous Q4H PRN [name], MD   10  mg at 10/20/19 1501  . ibuprofen (ADVIL) tablet 400 mg  400 mg Oral TID WC Nicole Kindred A, DO   400 mg at 10/25/19 1200  . insulin aspart (novoLOG) injection 0-9 Units  0-9 Units Subcutaneous TID WC Jennye Boroughs, MD   7 Units at 10/25/19 1200  . insulin detemir (LEVEMIR) injection 15 Units  15 Units Subcutaneous BID Jennye Boroughs, MD      . lidocaine (LIDODERM) 5 % 1 patch  1 patch Transdermal Q24H Sakai, Isami, DO   1 patch at 10/25/19 1007  . lidocaine (XYLOCAINE) 2 % viscous mouth solution 15 mL  15 mL Mouth/Throat Q3H PRN Nicole Kindred A, DO      . magic mouthwash  10 mL Oral QID Billey Co, MD   10 mL at 10/25/19 0844   And  . lidocaine (XYLOCAINE) 2 %  viscous mouth solution 5 mL  5 mL Mouth/Throat QID Billey Co, MD   5 mL at 10/25/19 1006  . LORazepam (ATIVAN) injection 1 mg  1 mg Intravenous Q6H PRN Lang Snow, NP   1 mg at 10/24/19 0754  . LORazepam (ATIVAN) tablet 1 mg  1 mg Oral Once Nicole Kindred A, DO      . morphine 2 MG/ML injection 2 mg  2 mg Intravenous Q4H PRN Nicole Kindred A, DO   2 mg at 10/22/19 2344  . multivitamin with minerals tablet 1 tablet  1 tablet Oral Daily Billey Co, MD   1 tablet at 10/25/19 [phone]  . nicotine (NICODERM CQ - dosed in mg/24 hours) patch 21 mg  21 mg Transdermal Daily Billey Co, MD   21 mg at 10/25/19 0853  . nystatin (MYCOSTATIN) 100000 UNIT/ML suspension 500,000 Units  5 mL Mouth/Throat TID Lin Landsman, MD   500,000 Units at 10/25/19 0843  . ondansetron (ZOFRAN) injection 4 mg  4 mg Intravenous Q6H PRN Billey Co, MD   4 mg at 10/20/19 1535  . oxyCODONE-acetaminophen (PERCOCET/ROXICET) 5-325 MG per tablet 2 tablet  2 tablet Oral Q6H Nicole Kindred A, DO   2 tablet at 10/25/19 0843  . pantoprazole (PROTONIX) EC tablet 40 mg  40 mg Oral BID Jennye Boroughs, MD   40 mg at 10/25/19 0843  . polyethylene glycol (MIRALAX / GLYCOLAX) packet 17 g  17 g Oral Daily Sakai, Isami, DO   17 g at 10/25/19 0845  . sucralfate (CARAFATE) 1 GM/10ML suspension 1 g  1 g Oral TID WC & HS Vanga, Tally Due, MD   1 g at 10/25/19 1200  . tamsulosin (FLOMAX) capsule 0.4 mg  0.4 mg Oral Daily Billey Co, MD   0.4 mg at 10/25/19 0843  . traMADol (ULTRAM) tablet 100 mg  100 mg Oral Q6H PRN Billey Co, MD   100 mg at 10/23/19 1617  . vitamin B-12 (CYANOCOBALAMIN) tablet 500 mcg  500 mcg Oral Daily Jennye Boroughs, MD   500 mcg at 10/25/19 0846   Objective: Vital signs in last 24 hours: Temp:  [98.3 F (36.8 C)-98.7 F (37.1 C)] 98.7 F (37.1 C) (04/16 0817) Pulse Rate:  [93-102] 101 (04/16 1005) Resp:  [18] 18 (04/16 0817) BP: (127-143)/(75-99) 141/82 (04/16  1005) SpO2:  [91 %-99 %] 94 % (04/16 1005)  Intake/Output from previous day: 04/15 0701 - 04/16 0700 In: 580 [P.O.:480; IV Piggyback:100] Out: 3100 [Urine:3100] Intake/Output this shift: No intake/output data recorded.  Physical Exam Vitals and nursing note reviewed.  Constitutional:      General: He is not in acute distress.    Appearance: He is not ill-appearing, toxic-appearing or diaphoretic.  HENT:     Head: Normocephalic and atraumatic.  Pulmonary:     Effort: Pulmonary effort is normal. No respiratory distress.  Skin:    General: Skin is warm and dry.  Neurological:     Mental Status: He is alert and oriented to person, place, and time.  Psychiatric:        Mood and Affect: Mood normal.        Behavior: Behavior normal.    Lab Results:  Recent Labs    10/24/19 0653 10/25/19 0430  WBC 14.6* 11.8*  HGB 11.8* 10.8*  HCT 36.1* 31.6*  PLT 264 253   BMET Recent Labs    10/23/19 0426 10/24/19 0653  NA 141 135  K 3.7 3.6  CL 100 97*  CO2 33* 29  GLUCOSE 82 84  BUN 18 18  CREATININE 0.96 1.08  CALCIUM 8.3* 8.0*   Assessment & Plan: 59 year old comorbid male s/p URS/LL/stent placement for management of a 5 mm distal right ureteral stone, who subsequently developed constipation and urinary retention, now s/p ureteral stent removal and with successful inpatient voiding trial today.  Upon review with Dr. Richardo Hanks, we disagree with radiology interpretation of the patient's CT scan yesterday; no evidence of right-sided pyelonephritis and RUQ pain was likely stent-related.  Notably, pain has improved since stent removal yesterday.  We do not believe antibiotics for right pyelonephritis is indicated.  Urology will sign off at this time.  No outpatient follow-up required.  Patient is noted to have penile calcifications on recent CT. If patient desires outpatient urology evaluation of ED, recommend referral to an outside urology practice that places IPPs for management of  this particular pathology, e.g. [facility] or [facility].   Carman Ching, PA-C 10/25/2019

## 2019-10-25 NOTE — Progress Notes (Signed)
Inpatient Diabetes Program Recommendations  AACE/ADA: New Consensus Statement on Inpatient Glycemic Control   Target Ranges:  Prepandial:   less than 140 mg/dL      Peak postprandial:   less than 180 mg/dL (1-2 hours)      Critically ill patients:  140 - 180 mg/dL  Results for MESHACH, PERRY (MRN [id number]) as of 10/25/2019 08:23  Ref. Range 10/25/2019 08:15  Glucose-Capillary Latest Ref Range: 70 - 99 mg/dL 443 (H)   Results for MURL, GOLLADAY (MRN [id number]) as of 10/25/2019 08:23  Ref. Range 10/24/2019 07:24 10/24/2019 12:22 10/24/2019 16:16 10/24/2019 21:56  Glucose-Capillary Latest Ref Range: 70 - 99 mg/dL 69 (L) 089 (H)  Levemir 20 units @13 :13 81 78   Review of Glycemic Control  Outpatient Diabetes medications:Levemir 25 units BID Current orders for Inpatient glycemic control:Levemir 20 units BID, Novolog 0-9 units TID with meals  Inpatient Diabetes Program Recommendations:   Insulin-Basal:  In reviewing chart, noted patient refused Levemir 20 units at bedtime last night (only received Levemir 20 units at 13:13 on 10/24/19. Fasting glucose 190 mg/dl today. May need to consider decreasing Levemir and/or changing to once a day.  Thanks, 10/26/19, RN, MSN, CDE Diabetes Coordinator Inpatient Diabetes Program [phone] (Team Pager from 8am to 5pm)

## 2019-10-25 NOTE — Progress Notes (Addendum)
Progress Note    Samuel [name]  DZH:[id number] DOB: 1961/05/21  DOA: 10/18/2019 PCP: [facility] [place]      Brief Narrative:    Medical records reviewed and are as summarized below:  Samuel [name] is an 59 y.o. male with history of coronary disease status post stent x1 in 2019, diabetes mellitus type 2 on insulin therapy complicated by DM neuropathy, essential hypertension and history of right-sided nephrolithiasis status post stent in the past. Patient is a resident of [place], and relocated to [place] area. Presented on 4/9 with intractable nausea and vomiting, right flank pain.  In the ED, glucose 900.  Patient had missed some of his insulin since moving.    Imaging studies did reveal right-sided nephrolithiasis with a 57mm distal ureteral stone. Admitted to hospitalist service for admission and further management.      Assessment/Plan:   Principal Problem:   Ureterolithiasis Active Problems:   Hyperglycemic crisis in diabetes mellitus ([place])   Psychosis ([place])   Acute pyelonephritis   Body mass index is 28.44 kg/m.   Abdominal wall pain with swelling/constipation. -no acute abnormality noted on CT abdomen and pelvis.  Patient has been evaluated by general surgeon and he has signed off because there are no surgical issues at this time.   Mood lability, depression  Continue Abilify  Possible esophageal food impaction/erosive esophagitis- GI consulted .  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  Potassium hydroxide test was negative for any fungal elements.  Pathology report showed antral and oxyntic mucosa with reactive gastritis.  Negative for H. pylori, dysplasia and malignancy.  Right flank pain/right lower back pain/no evidence of right pyelonephritis per urologist, Dr. Glori Luis. Right flank/lower back pain was attributed to previous ureteral stent.  Leukocytosis is improving.  Discontinue IV  Rocephin.  Analgesics as needed for pain. CT abdominal and pelvis was reviewed by urologist who said he disagrees with radiologist report of pyelonephritis.  Urine culture did not show any growth.   Ureterolithiasis, right-sided -patient with severe right flank pain radiating to the groin.  Urology consulted.  Underwent cystoscopy and right ureteroscopy with laser lithotripsy and right ureteral stent placement on 4/11.  Right ureteral stent was removed on 10/24/2019. --Continue Flomax --Analgesics as needed.  Urinary retention  Resolved.  Foley catheter has been removed.   Insulin-dependent type 2 diabetes with hyperglycemia /recent hyperglycemic crisis/diabetic peripheral neuropathy.  Poorly controlled HbA1c 15.5%!!   --Decrease Levemir from 20 units twice daily to 15 units twice daily. --Continue gabapentin  Mouth pain, most likely oral thrush --Magic mouthwash with lidocaine qid --added viscous lidocaine for in between doses  Coronary artery disease with prior stent.   Stable and no active chest pain.  Continue Plavix.  Vitamin D deficiency:  Continue vitamin D supplement.  Borderline low vitamin B12:  Continue vitamin B12 supplement.  Constipation Laxatives as needed.  Tapwater enema ordered.   Family Communication/Anticipated D/C date and plan/Code Status   DVT prophylaxis: Lovenox Code Status: Full code Family Communication: Plan discussed with the patient Disposition Plan:    Status is: Inpatient  Remains inpatient appropriate because:Inpatient level of care appropriate due to severity of illness   Dispo: The patient is from: Home              Anticipated d/c is to: Home              Anticipated d/c date is: 2 days  Patient currently is not medically stable to d/c.            Subjective:   He feels better today.  He was able to pass urine after removal of the Foley cath.  He still has pain in the right lower back.  He said it feels  like being hit with a baseball bat.  He still worried about his blood sugars.  He thinks he is getting too much Levemir.  He said he refused his Levemir dose last night.  Unfortunately, his glucose level has gone up to 331 today.  He requested an enema to help him "clean his bowels".  Objective:    Vitals:   10/24/19 1615 10/25/19 0115 10/25/19 0817 10/25/19 1005  BP: 130/83 127/75 (!) 143/99 (!) 141/82  Pulse: (!) 102 93 96 (!) 101  Resp: 18 18 18    Temp: 98.3 F (36.8 C) 98.3 F (36.8 C) 98.7 F (37.1 C)   TempSrc: Oral Oral Oral   SpO2: 94% 99% 91% 94%  Weight:      Height:        Intake/Output Summary (Last 24 hours) at 10/25/2019 1456 Last data filed at 10/25/2019 1300 Gross per 24 hour  Intake 480 ml  Output 2300 ml  Net -1820 ml   Filed Weights   10/18/19 1652 10/20/19 0040  Weight: 95.3 kg 89.9 kg    Exam:  GEN: NAD SKIN: No rash EYES: EOMI ENT: MMM CV: RRR PULM: CTA B ABD: soft, mildly distended, NT, +BS CNS: AAO x 3, non focal EXT: No edema or tenderness GU: Foley catheter with dark urine.  Right CVA tenderness   Data Reviewed:   I have personally reviewed following labs and imaging studies:  Labs: Labs show the following:   Basic Metabolic Panel: Recent Labs  Lab 10/20/19 0813 10/20/19 0813 10/21/19 0715 10/21/19 0715 10/22/19 0523 10/22/19 0523 10/23/19 0426 10/24/19 0653  NA 137  --  131*  --  139  --  141 135  K 3.0*   < > 4.3   < > 3.5   < > 3.7 3.6  CL 99  --  95*  --  101  --  100 97*  CO2 29  --  25  --  30  --  33* 29  GLUCOSE 156*  --  321*  --  208*  --  82 84  BUN 17  --  21*  --  20  --  18 18  CREATININE 0.97  --  1.16  --  0.95  --  0.96 1.08  CALCIUM 8.3*  --  8.3*  --  8.4*  --  8.3* 8.0*   < > = values in this interval not displayed.   GFR Estimated Creatinine Clearance: 86.2 mL/min (by C-G formula based on SCr of 1.08 mg/dL). Liver Function Tests: Recent Labs  Lab 10/18/19 1707  AST 22  ALT 16  ALKPHOS 143*    BILITOT 1.5*  PROT 8.3*  ALBUMIN 4.0   Recent Labs  Lab 10/18/19 1707 10/20/19 0813  LIPASE 18 11   No results for input(s): AMMONIA in the last 168 hours. Coagulation profile No results for input(s): INR, PROTIME in the last 168 hours.  CBC: Recent Labs  Lab 10/21/19 0715 10/22/19 0523 10/23/19 0426 10/24/19 0653 10/25/19 0430  WBC 14.4* 12.1* 11.7* 14.6* 11.8*  NEUTROABS 12.0* 9.3* 7.5 10.4* 7.7  HGB 14.0 12.7* 12.3* 11.8* 10.8*  HCT 39.0 37.2* 37.3* 36.1* 31.6*  MCV 89.9 95.4 97.4 98.1 94.9  PLT 319 266 288 264 253   Cardiac Enzymes: No results for input(s): CKTOTAL, CKMB, CKMBINDEX, TROPONINI in the last 168 hours. BNP (last 3 results) No results for input(s): PROBNP in the last 8760 hours. CBG: Recent Labs  Lab 10/24/19 1222 10/24/19 1616 10/24/19 2156 10/25/19 0815 10/25/19 1144  GLUCAP 164* 81 78 190* 331*   D-Dimer: No results for input(s): DDIMER in the last 72 hours. Hgb A1c: No results for input(s): HGBA1C in the last 72 hours. Lipid Profile: No results for input(s): CHOL, HDL, LDLCALC, TRIG, CHOLHDL, LDLDIRECT in the last 72 hours. Thyroid function studies: No results for input(s): TSH, T4TOTAL, T3FREE, THYROIDAB in the last 72 hours.  Invalid input(s): FREET3 Anemia work up: Recent Labs    10/23/19 0426  VITAMINB12 296   Sepsis Labs: Recent Labs  Lab 10/20/19 0813 10/20/19 1114 10/21/19 0715 10/22/19 0523 10/23/19 0426 10/24/19 0653 10/24/19 0926 10/25/19 0430  PROCALCITON  --   --   --   --   --  <0.10  --  <0.10  WBC 17.8*  --    < > 12.1* 11.7* 14.6*  --  11.8*  LATICACIDVEN 0.9 1.2  --   --   --  1.2 1.0  --    < > = values in this interval not displayed.    Microbiology Recent Results (from the past 240 hour(s))  Respiratory Panel by RT PCR (Flu A&B, Covid) - Nasopharyngeal Swab     Status: None   Collection Time: 10/18/19  6:31 PM   Specimen: Nasopharyngeal Swab  Result Value Ref Range Status   SARS Coronavirus 2  by RT PCR NEGATIVE NEGATIVE Final    Comment: (NOTE) SARS-CoV-2 target nucleic acids are NOT DETECTED. The SARS-CoV-2 RNA is generally detectable in upper respiratoy specimens during the acute phase of infection. The lowest concentration of SARS-CoV-2 viral copies this assay can detect is 131 copies/mL. A negative result does not preclude SARS-Cov-2 infection and should not be used as the sole basis for treatment or other patient management decisions. A negative result may occur with  improper specimen collection/handling, submission of specimen other than nasopharyngeal swab, presence of viral mutation(s) within the areas targeted by this assay, and inadequate number of viral copies (<131 copies/mL). A negative result must be combined with clinical observations, patient history, and epidemiological information. The expected result is Negative. Fact Sheet for Patients:  https://www.moore.com/ Fact Sheet for Healthcare Providers:  https://www.young.biz/ This test is not yet ap proved or cleared by the [place] FDA and  has been authorized for detection and/or diagnosis of SARS-CoV-2 by FDA under an Emergency Use Authorization (EUA). This EUA will remain  in effect (meaning this test can be used) for the duration of the COVID-19 declaration under Section 564(b)(1) of the Act, 21 U.S.C. section 360bbb-3(b)(1), unless the authorization is terminated or revoked sooner.    Influenza A by PCR NEGATIVE NEGATIVE Final   Influenza B by PCR NEGATIVE NEGATIVE Final    Comment: (NOTE) The Xpert Xpress SARS-CoV-2/FLU/RSV assay is intended as an aid in  the diagnosis of influenza from Nasopharyngeal swab specimens and  should not be used as a sole basis for treatment. Nasal washings and  aspirates are unacceptable for Xpert Xpress SARS-CoV-2/FLU/RSV  testing. Fact Sheet for Patients: https://www.moore.com/ Fact Sheet for Healthcare  Providers: https://www.young.biz/ This test is not yet approved or cleared by the [place] FDA and  has been authorized for detection and/or  diagnosis of SARS-CoV-2 by  FDA under an Emergency Use Authorization (EUA). This EUA will remain  in effect (meaning this test can be used) for the duration of the  Covid-19 declaration under Section 564(b)(1) of the Act, 21  U.S.C. section 360bbb-3(b)(1), unless the authorization is  terminated or revoked. Performed at [facility], [address] [place], [place] [address]   CULTURE, BLOOD (ROUTINE X 2) w Reflex to ID Panel     Status: None   Collection Time: 10/20/19  8:23 AM   Specimen: BLOOD  Result Value Ref Range Status   Specimen Description BLOOD BLOOD RIGHT WRIST  Final   Special Requests   Final    BOTTLES DRAWN AEROBIC AND ANAEROBIC Blood Culture adequate volume   Culture   Final    NO GROWTH 5 DAYS Performed at [facility], [address] [place], [place] [address]    Report Status 10/25/2019 FINAL  Final  CULTURE, BLOOD (ROUTINE X 2) w Reflex to ID Panel     Status: None   Collection Time: 10/20/19 11:14 AM   Specimen: BLOOD  Result Value Ref Range Status   Specimen Description BLOOD LEFT ANTECUBITAL  Final   Special Requests   Final    BOTTLES DRAWN AEROBIC AND ANAEROBIC Blood Culture adequate volume   Culture   Final    NO GROWTH 5 DAYS Performed at [facility], [address] [place], [place] [address]    Report Status 10/25/2019 FINAL  Final  KOH prep     Status: None   Collection Time: 10/21/19  3:16 PM   Specimen: Esophagus  Result Value Ref Range Status   Specimen Description ESOPHAGUS  Final   Special Requests Normal  Final   KOH Prep   Final    NO YEAST OR FUNGAL ELEMENTS SEEN Performed at [facility], [address], [place], [place] [address]    Report Status 10/21/2019 FINAL  Final  Urine Culture     Status: None   Collection Time:  10/24/19 10:24 AM   Specimen: Urine, Clean Catch  Result Value Ref Range Status   Specimen Description   Final    URINE, CLEAN CATCH Performed at [facility], [address], [place], [place] [address]    Special Requests   Final    NONE Performed at [facility], [address] [place], [place] [address]    Culture   Final    NO GROWTH Performed at [facility], [address], [place], [place] [address]    Report Status 10/25/2019 FINAL  Final    Procedures and diagnostic studies:  CT ABDOMEN PELVIS W CONTRAST  Result Date: 10/23/2019 CLINICAL DATA:  Right upper quadrant abdominal pain EXAM: CT ABDOMEN AND PELVIS WITH CONTRAST TECHNIQUE: Multidetector CT imaging of the abdomen and pelvis was performed using the standard protocol following bolus administration of intravenous contrast. CONTRAST:  OMNIPAQUE IOHEXOL 300 MG/ML  SOLN COMPARISON:  10/18/2019 FINDINGS: Lower chest: Hypoventilatory changes are seen within the dependent lower lobes. Interval development of trace right pleural effusion. Esophageal wall thickening seen previously has resolved, previously likely representing under distension. Hepatobiliary: No focal liver abnormality is seen. No gallstones, gallbladder wall thickening, or biliary dilatation. Pancreas: Unremarkable. No pancreatic ductal dilatation or surrounding inflammatory changes. Spleen: Normal in size without focal abnormality. Adrenals/Urinary Tract: Interval placement of a right ureteral stent. There as been passage or removal of the distal right ureteral calculus seen previously.  Interval development of heterogeneous decreased enhancement within the upper pole and lateral interpolar aspect right kidney, worrisome for pyelonephritis. No evidence of renal abscess. The left kidney enhances normally. Bladder is decompressed with a Foley catheter. Adrenals are unremarkable. Stomach/Bowel: Continue distension of the stomach. No bowel  obstruction or ileus. Normal appendix right lower quadrant. No bowel wall thickening or inflammatory change. Vascular/Lymphatic: Aortic atherosclerosis. No pathologic adenopathy. Reproductive: Prostate is unremarkable. Stable penile corporal calcifications. Other: Postsurgical changes from prior left inguinal hernia repair. No free fluid or free gas. Musculoskeletal: No acute or destructive bony lesions. Reconstructed images demonstrate no additional findings. IMPRESSION: 1. Interval passage or removal of the right ureteral calculus seen previously, now with a ureteral stent in place. 2. Interval development of right-sided pyelonephritis. No renal abscess. 3. Trace right pleural effusion. Electronically Signed   By: Sharlet Salina M.D.   On: 10/23/2019 21:21    Medications:   . amLODipine  10 mg Oral Daily  . ARIPiprazole  5 mg Oral Daily  . cholecalciferol  1,000 Units Oral Daily  . clopidogrel  75 mg Oral Daily  . docusate sodium  100 mg Oral Daily  . gabapentin  300 mg Oral BID  . ibuprofen  400 mg Oral TID WC  . insulin aspart  0-9 Units Subcutaneous TID WC  . insulin detemir  15 Units Subcutaneous BID  . lidocaine  1 patch Transdermal Q24H  . magic mouthwash  10 mL Oral QID   And  . lidocaine  5 mL Mouth/Throat QID  . LORazepam  1 mg Oral Once  . multivitamin with minerals  1 tablet Oral Daily  . nicotine  21 mg Transdermal Daily  . nystatin  5 mL Mouth/Throat TID  . oxyCODONE-acetaminophen  2 tablet Oral Q6H  . pantoprazole  40 mg Oral BID  . polyethylene glycol  17 g Oral Daily  . [START ON 10/26/2019] Ensure Max Protein  11 oz Oral BID BM  . sucralfate  1 g Oral TID WC & HS  . tamsulosin  0.4 mg Oral Daily  . vitamin B-12  500 mcg Oral Daily   Continuous Infusions:    LOS: 7 days   Treylon Henard  [facility]     10/25/2019, 2:56 PM

## 2019-10-25 NOTE — Progress Notes (Signed)
Nutrition Follow-up  DOCUMENTATION CODES:   Not applicable  INTERVENTION:  Will discontinue Glucerna per patient request.  Provide Ensure Max Protein po BID, each supplement provides 150 kcal and 30 grams of protein.  NUTRITION DIAGNOSIS:   Increased nutrient needs related to acute illness as evidenced by estimated needs.  Ongoing.  GOAL:   Patient will meet greater than or equal to 90% of their needs  Progressing.  MONITOR:   PO intake, Supplement acceptance, Weight trends, Labs, I & O's  REASON FOR ASSESSMENT:   Consult Assessment of nutrition requirement/status  ASSESSMENT:   Patient with PMH significant for CAD s/p stenting, DM, neuropathy, s/p R TMA, essential HTN, and right-sided nephrolithiasis s/p stenting. Presents this admission with R-sided ureterolithiasis.   -Patient s/p cystoscopy and right ureteroscopy with laser lithotripsy and right ureteral stent placement on 4/11. -Patient underwent EGD on 4/12 that showed erosive duodenoscopy without bleeding, LA grade D reflux, candidiasis, and erosive esophagitis.  Met with patient at bedside. He reports his appetite was decreased for 6 days but is improving now. He is now finishing 75-100% of his meals. He reports he does not like Glucerna and cannot drink them. He is amenable to trying Ensure Max Protein. Unable to complete Nutrition Focused Physical Exam today as another provider came into room to see patient.  Medications reviewed and include: vitamin D3 1000 units daily, Colace 100 mg daily, gabapentin, Novolog 0-9 units TID, Levemir 15 units BID, MVI daily, pantoprazole, Miralax, Carafate 1 gram TID and QHS, Flomax, vitamin B12 500 micrograms daily, ceftriaxone.  Labs reviewed: CBG 78-331.  Diet Order:   Diet Order            Diet Carb Modified Fluid consistency: Thin; Room service appropriate? Yes  Diet effective now             EDUCATION NEEDS:   Not appropriate for education at this time  Skin:   Skin Assessment: Reviewed RN Assessment  Last BM:  10/23/2019 small type 6  Height:   Ht Readings from Last 1 Encounters:  10/20/19 '5\' 10"'  (1.778 m)   Weight:   Wt Readings from Last 1 Encounters:  10/20/19 89.9 kg   BMI:  Body mass index is 28.44 kg/m.  Estimated Nutritional Needs:   Kcal:  2200-2400 kcal  Protein:  110-125 grams  Fluid:  >/= 2.2 L/day  Jacklynn Barnacle, MS, RD, LDN Pager number available on Amion

## 2019-10-25 NOTE — Progress Notes (Signed)
D: Pt alert and oriented x 4. Pt denies experiencing any pain at this time. Pt reports later in the day that he is still seeing things that are not there.  A: Scheduled medications administered to pt, per MD orders. Support and encouragement provided. Frequent verbal contact made.    R: No adverse drug reactions noted. Pt complaint with medications and treatment plan with the exception of his morning dose of novolg that he refused. Pt interacts well with staff on the unit. Pt is stable at this time, Will continue to monitor and provide care for as ordered.

## 2019-10-26 ENCOUNTER — Other Ambulatory Visit: Payer: Self-pay

## 2019-10-26 ENCOUNTER — Encounter: Payer: Self-pay | Admitting: Family

## 2019-10-26 ENCOUNTER — Inpatient Hospital Stay
Admission: RE | Admit: 2019-10-26 | Discharge: 2019-10-28 | DRG: 885 | Disposition: A | Discharge status: Home / Self Care | Payer: Medicaid Other | Source: Intra-hospital | Attending: Psychiatry | Admitting: Psychiatry

## 2019-10-26 DIAGNOSIS — I10 Essential (primary) hypertension: Secondary | ICD-10-CM | POA: Diagnosis present

## 2019-10-26 DIAGNOSIS — K219 Gastro-esophageal reflux disease without esophagitis: Secondary | ICD-10-CM | POA: Diagnosis present

## 2019-10-26 DIAGNOSIS — F419 Anxiety disorder, unspecified: Secondary | ICD-10-CM | POA: Diagnosis present

## 2019-10-26 DIAGNOSIS — Z955 Presence of coronary angioplasty implant and graft: Secondary | ICD-10-CM | POA: Diagnosis not present

## 2019-10-26 DIAGNOSIS — Z794 Long term (current) use of insulin: Secondary | ICD-10-CM

## 2019-10-26 DIAGNOSIS — F323 Major depressive disorder, single episode, severe with psychotic features: Principal | ICD-10-CM | POA: Diagnosis present

## 2019-10-26 DIAGNOSIS — E1165 Type 2 diabetes mellitus with hyperglycemia: Secondary | ICD-10-CM | POA: Diagnosis present

## 2019-10-26 DIAGNOSIS — T50905A Adverse effect of unspecified drugs, medicaments and biological substances, initial encounter: Secondary | ICD-10-CM | POA: Diagnosis not present

## 2019-10-26 DIAGNOSIS — Z79899 Other long term (current) drug therapy: Secondary | ICD-10-CM | POA: Diagnosis not present

## 2019-10-26 DIAGNOSIS — G4700 Insomnia, unspecified: Secondary | ICD-10-CM | POA: Diagnosis present

## 2019-10-26 DIAGNOSIS — Z7902 Long term (current) use of antithrombotics/antiplatelets: Secondary | ICD-10-CM

## 2019-10-26 DIAGNOSIS — F19951 Other psychoactive substance use, unspecified with psychoactive substance-induced psychotic disorder with hallucinations: Secondary | ICD-10-CM

## 2019-10-26 DIAGNOSIS — Z87891 Personal history of nicotine dependence: Secondary | ICD-10-CM

## 2019-10-26 DIAGNOSIS — E1143 Type 2 diabetes mellitus with diabetic autonomic (poly)neuropathy: Secondary | ICD-10-CM | POA: Diagnosis present

## 2019-10-26 DIAGNOSIS — R410 Disorientation, unspecified: Secondary | ICD-10-CM | POA: Diagnosis not present

## 2019-10-26 DIAGNOSIS — Z7982 Long term (current) use of aspirin: Secondary | ICD-10-CM | POA: Diagnosis not present

## 2019-10-26 LAB — BASIC METABOLIC PANEL
Anion gap: 8 (ref 5–15)
BUN: 15 mg/dL (ref 6–20)
CO2: 30 mmol/L (ref 22–32)
Calcium: 8 mg/dL — ABNORMAL LOW (ref 8.9–10.3)
Chloride: 99 mmol/L (ref 98–111)
Creatinine, Ser: 0.91 mg/dL (ref 0.61–1.24)
GFR calc Af Amer: >60 mL/min (ref >60)
GFR calc non Af Amer: >60 mL/min (ref >60)
Glucose, Bld: 151 mg/dL — ABNORMAL HIGH (ref 70–99)
Potassium: 4 mmol/L (ref 3.5–5.1)
Sodium: 137 mmol/L (ref 135–145)

## 2019-10-26 LAB — GLUCOSE, CAPILLARY
Glucose-Capillary: 128 mg/dL — ABNORMAL HIGH (ref 70–99)
Glucose-Capillary: 131 mg/dL — ABNORMAL HIGH (ref 70–99)
Glucose-Capillary: 236 mg/dL — ABNORMAL HIGH (ref 70–99)
Glucose-Capillary: 214 mg/dL — ABNORMAL HIGH (ref 70–99)
Glucose-Capillary: 186 mg/dL — ABNORMAL HIGH (ref 70–99)

## 2019-10-26 LAB — CBC WITH DIFFERENTIAL/PLATELET
Abs Immature Granulocytes: 0.05 10*3/uL (ref 0.00–0.07)
Basophils Absolute: 0 10*3/uL (ref 0.0–0.1)
Basophils Relative: 0 %
Eosinophils Absolute: 0.3 10*3/uL (ref 0.0–0.5)
Eosinophils Relative: 2 %
HCT: 34.3 % — ABNORMAL LOW (ref 39.0–52.0)
Hemoglobin: 11.2 g/dL — ABNORMAL LOW (ref 13.0–17.0)
Immature Granulocytes: 0 %
Lymphocytes Relative: 22 %
Lymphs Abs: 2.6 10*3/uL (ref 0.7–4.0)
MCH: 32.1 pg (ref 26.0–34.0)
MCHC: 32.7 g/dL (ref 30.0–36.0)
MCV: 98.3 fL (ref 80.0–100.0)
Monocytes Absolute: 1 10*3/uL (ref 0.1–1.0)
Monocytes Relative: 8 %
Neutro Abs: 8 10*3/uL — ABNORMAL HIGH (ref 1.7–7.7)
Neutrophils Relative %: 68 %
Platelets: 300 10*3/uL (ref 150–400)
RBC: 3.49 MIL/uL — ABNORMAL LOW (ref 4.22–5.81)
RDW: 12 % (ref 11.5–15.5)
WBC: 12 10*3/uL — ABNORMAL HIGH (ref 4.0–10.5)
nRBC: 0 % (ref 0.0–0.2)

## 2019-10-26 MED ORDER — TAMSULOSIN HCL 0.4 MG PO CAPS
0.4000 mg | ORAL_CAPSULE | Freq: Every day | ORAL | Status: DC
  Administered 2019-10-27 – 2019-10-28 (×2): 0.4 mg via ORAL
  Filled 2019-10-26 (×4): qty 1

## 2019-10-26 MED ORDER — PANTOPRAZOLE SODIUM 40 MG PO TBEC: 40.0000 mg | DELAYED_RELEASE_TABLET | Freq: Two times a day (BID) | ORAL | 0 refills | Status: DC

## 2019-10-26 MED ORDER — TAMSULOSIN HCL 0.4 MG PO CAPS: 0.4000 mg | ORAL_CAPSULE | Freq: Every day | ORAL | 0 refills | Status: DC

## 2019-10-26 MED ORDER — ACETAMINOPHEN 325 MG PO TABS: 650.0000 mg | ORAL_TABLET | Freq: Four times a day (QID) | ORAL | Status: DC | PRN

## 2019-10-26 MED ORDER — OXYCODONE-ACETAMINOPHEN 5-325 MG PO TABS
1.0000 | ORAL_TABLET | Freq: Three times a day (TID) | ORAL | Status: DC | PRN
  Administered 2019-10-27 – 2019-10-28 (×4): 2 via ORAL
  Administered 2019-10-28: 05:00:00 1 via ORAL
  Filled 2019-10-26 (×4): qty 2
  Filled 2019-10-26 (×2): qty 1

## 2019-10-26 MED ORDER — CYANOCOBALAMIN 500 MCG PO TABS: 500.0000 ug | ORAL_TABLET | Freq: Every day | ORAL | 0 refills | Status: DC

## 2019-10-26 MED ORDER — OLANZAPINE 5 MG PO TABS
5.0000 mg | ORAL_TABLET | Freq: Every day | ORAL | Status: DC
  Administered 2019-10-26 – 2019-10-27 (×2): 5 mg via ORAL
  Filled 2019-10-26 (×2): qty 1

## 2019-10-26 MED ORDER — NICOTINE 21 MG/24HR TD PT24: 21.0000 mg | MEDICATED_PATCH | Freq: Every day | TRANSDERMAL | 0 refills | Status: DC

## 2019-10-26 MED ORDER — ACETAMINOPHEN 325 MG PO TABS
650.0000 mg | ORAL_TABLET | Freq: Four times a day (QID) | ORAL | Status: DC | PRN
  Administered 2019-10-28: 05:00:00 650 mg via ORAL
  Filled 2019-10-26: qty 2

## 2019-10-26 MED ORDER — NYSTATIN 100000 UNIT/ML MT SUSP: 5.0000 mL | Freq: Four times a day (QID) | OROMUCOSAL | 0 refills | Status: DC

## 2019-10-26 MED ORDER — VITAMIN D3 25 MCG PO TABS: 1000.0000 [IU] | ORAL_TABLET | Freq: Every day | ORAL | 0 refills | Status: DC

## 2019-10-26 MED ORDER — HYDROCHLOROTHIAZIDE 25 MG PO TABS
25.0000 mg | ORAL_TABLET | Freq: Every day | ORAL | Status: DC
  Administered 2019-10-26: 25 mg via ORAL
  Filled 2019-10-26: qty 1

## 2019-10-26 MED ORDER — INSULIN DETEMIR 100 UNIT/ML ~~LOC~~ SOLN
15.0000 [IU] | Freq: Two times a day (BID) | SUBCUTANEOUS | Status: DC
  Administered 2019-10-26 – 2019-10-27 (×2): 15 [IU] via SUBCUTANEOUS
  Filled 2019-10-26 (×3): qty 0.15

## 2019-10-26 MED ORDER — ALUM & MAG HYDROXIDE-SIMETH 200-200-20 MG/5ML PO SUSP: 30.0000 mL | ORAL | Status: DC | PRN

## 2019-10-26 MED ORDER — CLOPIDOGREL BISULFATE 75 MG PO TABS: 75.0000 mg | ORAL_TABLET | Freq: Every day | ORAL | 0 refills | Status: DC

## 2019-10-26 MED ORDER — ARIPIPRAZOLE 5 MG PO TABS: 5.0000 mg | ORAL_TABLET | Freq: Every day | ORAL | 0 refills | Status: DC

## 2019-10-26 MED ORDER — HYDRALAZINE HCL 10 MG PO TABS
10.0000 mg | ORAL_TABLET | Freq: Three times a day (TID) | ORAL | Status: DC | PRN
  Filled 2019-10-26: qty 1

## 2019-10-26 MED ORDER — POLYETHYLENE GLYCOL 3350 17 G PO PACK: 17.0000 g | PACK | Freq: Every day | ORAL | 0 refills | Status: DC | PRN

## 2019-10-26 MED ORDER — ENSURE MAX PROTEIN PO LIQD
11.0000 [oz_av] | Freq: Two times a day (BID) | ORAL | Status: DC
  Filled 2019-10-26 (×4): qty 330

## 2019-10-26 MED ORDER — VITAMIN D 25 MCG (1000 UNIT) PO TABS
1000.0000 [IU] | ORAL_TABLET | Freq: Every day | ORAL | Status: DC
  Administered 2019-10-27 – 2019-10-28 (×2): 1000 [IU] via ORAL
  Filled 2019-10-26 (×2): qty 1

## 2019-10-26 MED ORDER — MAGNESIUM HYDROXIDE 400 MG/5ML PO SUSP: 30.0000 mL | Freq: Every day | ORAL | Status: DC | PRN

## 2019-10-26 MED ORDER — VITAMIN B-12 1000 MCG PO TABS
500.0000 ug | ORAL_TABLET | Freq: Every day | ORAL | Status: DC
  Administered 2019-10-27 – 2019-10-28 (×2): 500 ug via ORAL
  Filled 2019-10-26 (×2): qty 1

## 2019-10-26 MED ORDER — INSULIN ASPART 100 UNIT/ML ~~LOC~~ SOLN
0.0000 [IU] | Freq: Three times a day (TID) | SUBCUTANEOUS | Status: DC
  Administered 2019-10-27: 11:00:00 3 [IU] via SUBCUTANEOUS
  Filled 2019-10-26: qty 1

## 2019-10-26 MED ORDER — NICOTINE 21 MG/24HR TD PT24
21.0000 mg | MEDICATED_PATCH | Freq: Every day | TRANSDERMAL | Status: DC
  Administered 2019-10-27 – 2019-10-28 (×2): 21 mg via TRANSDERMAL
  Filled 2019-10-26 (×2): qty 1

## 2019-10-26 MED ORDER — LIDOCAINE 5 % EX PTCH
1.0000 | MEDICATED_PATCH | CUTANEOUS | Status: DC
  Administered 2019-10-27 – 2019-10-28 (×2): 1 via TRANSDERMAL
  Filled 2019-10-26 (×2): qty 1

## 2019-10-26 MED ORDER — OXYCODONE-ACETAMINOPHEN 5-325 MG PO TABS: 1.0000 | ORAL_TABLET | Freq: Three times a day (TID) | ORAL | 0 refills | Status: DC | PRN

## 2019-10-26 MED ORDER — CLOPIDOGREL BISULFATE 75 MG PO TABS
75.0000 mg | ORAL_TABLET | Freq: Every day | ORAL | Status: DC
  Administered 2019-10-27 – 2019-10-28 (×2): 75 mg via ORAL
  Filled 2019-10-26 (×2): qty 1

## 2019-10-26 MED ORDER — GABAPENTIN 300 MG PO CAPS
300.0000 mg | ORAL_CAPSULE | Freq: Two times a day (BID) | ORAL | Status: DC
  Administered 2019-10-26 – 2019-10-28 (×5): 300 mg via ORAL
  Filled 2019-10-26 (×5): qty 1

## 2019-10-26 MED ORDER — POLYETHYLENE GLYCOL 3350 17 G PO PACK
17.0000 g | PACK | Freq: Every day | ORAL | Status: DC
  Filled 2019-10-26: qty 1

## 2019-10-26 MED ORDER — GABAPENTIN 300 MG PO CAPS: 300.0000 mg | ORAL_CAPSULE | Freq: Two times a day (BID) | ORAL | 0 refills | Status: DC

## 2019-10-26 MED ORDER — LIDOCAINE VISCOUS HCL 2 % MT SOLN
15.0000 mL | OROMUCOSAL | Status: DC | PRN
  Administered 2019-10-26 – 2019-10-28 (×7): 15 mL via OROMUCOSAL
  Filled 2019-10-26 (×11): qty 15

## 2019-10-26 MED ORDER — AMLODIPINE BESYLATE 10 MG PO TABS: 10.0000 mg | ORAL_TABLET | Freq: Every day | ORAL | 0 refills | Status: DC

## 2019-10-26 MED ORDER — ADULT MULTIVITAMIN W/MINERALS CH
1.0000 | ORAL_TABLET | Freq: Every day | ORAL | Status: DC
  Administered 2019-10-27 – 2019-10-28 (×2): 1 via ORAL
  Filled 2019-10-26 (×2): qty 1

## 2019-10-26 MED ORDER — HYDROCHLOROTHIAZIDE 25 MG PO TABS: 25.0000 mg | ORAL_TABLET | Freq: Every day | ORAL | 0 refills | Status: DC

## 2019-10-26 MED ORDER — IBUPROFEN 200 MG PO TABS
400.0000 mg | ORAL_TABLET | Freq: Three times a day (TID) | ORAL | Status: DC
  Filled 2019-10-26: qty 2

## 2019-10-26 MED ORDER — DOCUSATE SODIUM 100 MG PO CAPS
100.0000 mg | ORAL_CAPSULE | Freq: Every day | ORAL | Status: DC
  Filled 2019-10-26 (×2): qty 1

## 2019-10-26 MED ORDER — PANTOPRAZOLE SODIUM 40 MG PO TBEC
40.0000 mg | DELAYED_RELEASE_TABLET | Freq: Two times a day (BID) | ORAL | Status: DC
  Administered 2019-10-26 – 2019-10-28 (×5): 40 mg via ORAL
  Filled 2019-10-26 (×5): qty 1

## 2019-10-26 MED ORDER — INSULIN DETEMIR 100 UNIT/ML ~~LOC~~ SOLN: 15.0000 [IU] | Freq: Two times a day (BID) | SUBCUTANEOUS | 0 refills | Status: DC

## 2019-10-26 MED ORDER — NYSTATIN 100000 UNIT/ML MT SUSP
5.0000 mL | Freq: Three times a day (TID) | OROMUCOSAL | Status: DC
  Administered 2019-10-27 – 2019-10-28 (×5): 500000 [IU] via OROMUCOSAL
  Filled 2019-10-26 (×7): qty 5

## 2019-10-26 MED ORDER — LIDOCAINE VISCOUS HCL 2 % MT SOLN: 15.0000 mL | OROMUCOSAL | 0 refills | Status: DC | PRN

## 2019-10-26 MED ORDER — AMLODIPINE BESYLATE 5 MG PO TABS
10.0000 mg | ORAL_TABLET | Freq: Every day | ORAL | Status: DC
  Administered 2019-10-27 – 2019-10-28 (×2): 10 mg via ORAL
  Filled 2019-10-26 (×2): qty 2

## 2019-10-26 NOTE — Plan of Care (Signed)
  Problem: Education: Goal: Knowledge of General Education information will improve Description: Including pain rating scale, medication(s)/side effects and non-pharmacologic comfort measures 10/26/2019 0729 by Simeon Craft, RN Outcome: Progressing 10/26/2019 0550 by Simeon Craft, RN Outcome: Progressing   Problem: Clinical Measurements: Goal: Ability to maintain clinical measurements within normal limits will improve 10/26/2019 0729 by Simeon Craft, RN Outcome: Progressing 10/26/2019 0550 by Simeon Craft, RN Outcome: Progressing Goal: Will remain free from infection 10/26/2019 0729 by Simeon Craft, RN Outcome: Progressing 10/26/2019 0550 by Simeon Craft, RN Outcome: Progressing   Problem: Activity: Goal: Risk for activity intolerance will decrease 10/26/2019 0729 by Simeon Craft, RN Outcome: Progressing 10/26/2019 0550 by Simeon Craft, RN Outcome: Progressing   Problem: Pain Managment: Goal: General experience of comfort will improve Outcome: Progressing   Problem: Safety: Goal: Ability to remain free from injury will improve Outcome: Progressing   Problem: Skin Integrity: Goal: Risk for impaired skin integrity will decrease 10/26/2019 0729 by Simeon Craft, RN Outcome: Progressing 10/26/2019 0550 by Simeon Craft, RN Outcome: Progressing

## 2019-10-26 NOTE — Consult Note (Signed)
[facility] Face-to-Face Psychiatry Consult   Reason for Consult:  Tearful and with hallucinations  Referring Physician: Hospitalist Patient Identification: Samuel [name] MRN:  [id number] Principal Diagnosis: Ureterolithiasis Diagnosis:  Principal Problem:   Ureterolithiasis Active Problems:   Hyperglycemic crisis in diabetes mellitus (HCC)   Psychosis (HCC)   Acute pyelonephritis   Total Time spent with patient: 30 minutes  Subjective:   Samuel [name] is a 59 y.o. male patient admitted with complaints of visual, auditory and tactile hallucinations when closing his eyes for the past 5 days.   HPI:  I got put down about 3 times in the past three weeks. I think all that anesthesia did it for me. Every time I close my eyes I see the [place] billies. I woke up and hit my IV earlier against the bed because I thought my bed was on fire. This has never happened to me before and it is scaring the shit out of me."   Pt alert and oriented x 4. Pt mood/affect was originally euthymic and anxious, and as we progressed he became irritate, labile, and his tone increased. His speech was pressured. Also observed mood fluctuations ranging from labile, sad, tearful, and blunt. He states he is under a lot of stress and is trying to find someone else to live, and his Zenaida Niece needs a new transmission, and he doesn't have insurance to get his medications.  Patient repeated this "Pt also talks about how when he closes his eyes he feelings like he's getting out of bed and that people are in the room, and that his bed is moving. Pt states that he has to open his eyes to see that he isn't getting out of bed and that no one is there, and his bed isn't moving. ".  Pt received PRN meds. He continues to ruminate about his need for assistance and medications, and at one point it seemed he maybe malingering and avoiding discharge. Patient denies any previous psych history at this time, therefore with his mood lability, mood swings,  new onset of hallucinations, and risk factors will admit to behavioral health unit.   Past Psychiatric History: He denies prior psychiatric problems  Risk to Self:   Risk to Others:   Prior Inpatient Therapy:   Prior Outpatient Therapy:    Past Medical History:  Past Medical History:  Diagnosis Date  . Diabetes mellitus without complication (HCC)   . Hypertension   . UTI (urinary tract infection)     Past Surgical History:  Procedure Laterality Date  . CORONARY ANGIOPLASTY WITH STENT PLACEMENT    . CYSTOSCOPY/URETEROSCOPY/HOLMIUM LASER/STENT PLACEMENT Right 10/20/2019   Procedure: CYSTOSCOPY/URETEROSCOPY/HOLMIUM LASER/STENT PLACEMENT;  Surgeon: [name], MD;  Location: [facility] ORS;  Service: Urology;  Laterality: Right;  . ESOPHAGOGASTRODUODENOSCOPY N/A 10/21/2019   Procedure: ESOPHAGOGASTRODUODENOSCOPY (EGD);  Surgeon: Toney Reil, MD;  Location: [facility] ENDOSCOPY;  Service: Gastroenterology;  Laterality: N/A;  . FOOT AMPUTATION    . HERNIA REPAIR    . INCISION AND DRAINAGE PERIRECTAL ABSCESS N/A 09/20/2019   Procedure: IRRIGATION AND DEBRIDEMENT PERIRECTAL ABSCESS;  Surgeon: Carolan Shiver, MD;  Location: [facility] ORS;  Service: General;  Laterality: N/A;   Family History: History reviewed. No pertinent family history. Family Psychiatric  History: Social History:  Social History   Substance and Sexual Activity  Alcohol Use Not Currently     Social History   Substance and Sexual Activity  Drug Use Not on file    Social History   Socioeconomic History  .  Marital status: Single    Spouse name: Not on file  . Number of children: Not on file  . Years of education: Not on file  . Highest education level: Not on file  Occupational History  . Not on file  Tobacco Use  . Smoking status: Former Research scientist (life sciences)  . Smokeless tobacco: Never Used  Substance and Sexual Activity  . Alcohol use: Not Currently  . Drug use: Not on file  . Sexual activity: Not on file  Other  Topics Concern  . Not on file  Social History Narrative  . Not on file   Social Determinants of Health   Financial Resource Strain:   . Difficulty of Paying Living Expenses:   Food Insecurity:   . Worried About Charity fundraiser in the Last Year:   . Arboriculturist in the Last Year:   Transportation Needs:   . Film/video editor (Medical):   Marland Kitchen Lack of Transportation (Non-Medical):   Physical Activity:   . Days of Exercise per Week:   . Minutes of Exercise per Session:   Stress:   . Feeling of Stress :   Social Connections:   . Frequency of Communication with Friends and Family:   . Frequency of Social Gatherings with Friends and Family:   . Attends Religious Services:   . Active Member of Clubs or Organizations:   . Attends Archivist Meetings:   Marland Kitchen Marital Status:    Additional Social History:    Allergies:  No Known Allergies  Labs:  Results for orders placed or performed during the hospital encounter of 10/18/19 (from the past 48 hour(s))  Glucose, capillary     Status: None   Collection Time: 10/24/19  4:16 PM  Result Value Ref Range   Glucose-Capillary 81 70 - 99 mg/dL    Comment: Glucose reference range applies only to samples taken after fasting for at least 8 hours.   Comment 1 Notify RN   Glucose, capillary     Status: None   Collection Time: 10/24/19  9:56 PM  Result Value Ref Range   Glucose-Capillary 78 70 - 99 mg/dL    Comment: Glucose reference range applies only to samples taken after fasting for at least 8 hours.   Comment 1 Notify RN   Procalcitonin     Status: None   Collection Time: 10/25/19  4:30 AM  Result Value Ref Range   Procalcitonin <0.10 ng/mL    Comment:        Interpretation: PCT (Procalcitonin) <= 0.5 ng/mL: Systemic infection (sepsis) is not likely. Local bacterial infection is possible. (NOTE)       Sepsis PCT Algorithm           Lower Respiratory Tract                                      Infection PCT  Algorithm    ----------------------------     ----------------------------         PCT < 0.25 ng/mL                PCT < 0.10 ng/mL         Strongly encourage             Strongly discourage   discontinuation of antibiotics    initiation of antibiotics    ----------------------------     -----------------------------  PCT 0.25 - 0.50 ng/mL            PCT 0.10 - 0.25 ng/mL               OR       >80% decrease in PCT            Discourage initiation of                                            antibiotics      Encourage discontinuation           of antibiotics    ----------------------------     -----------------------------         PCT >= 0.50 ng/mL              PCT 0.26 - 0.50 ng/mL               AND        <80% decrease in PCT             Encourage initiation of                                             antibiotics       Encourage continuation           of antibiotics    ----------------------------     -----------------------------        PCT >= 0.50 ng/mL                  PCT > 0.50 ng/mL               AND         increase in PCT                  Strongly encourage                                      initiation of antibiotics    Strongly encourage escalation           of antibiotics                                     -----------------------------                                           PCT <= 0.25 ng/mL                                                 OR                                        > 80% decrease in PCT                                       Discontinue / Do not initiate                                             antibiotics Performed at [facility], [address] [place], [place] [address]   CBC with Differential/Platelet     Status: Abnormal   Collection Time: 10/25/19  4:30 AM  Result Value Ref Range   WBC 11.8 (H) 4.0 - 10.5 K/uL   RBC 3.33 (L) 4.22 - 5.81 MIL/uL   Hemoglobin 10.8 (L) 13.0 - 17.0 g/dL   HCT 60.431.6 (L) 54.039.0 - 98.152.0 %   MCV  94.9 80.0 - 100.0 fL   MCH 32.4 26.0 - 34.0 pg   MCHC 34.2 30.0 - 36.0 g/dL   RDW 19.112.0 47.811.5 - 29.515.5 %   Platelets 253 150 - 400 K/uL   nRBC 0.0 0.0 - 0.2 %   Neutrophils Relative % 65 %   Neutro Abs 7.7 1.7 - 7.7 K/uL   Lymphocytes Relative 23 %   Lymphs Abs 2.7 0.7 - 4.0 K/uL   Monocytes Relative 9 %   Monocytes Absolute 1.1 (H) 0.1 - 1.0 K/uL   Eosinophils Relative 2 %   Eosinophils Absolute 0.2 0.0 - 0.5 K/uL   Basophils Relative 0 %   Basophils Absolute 0.0 0.0 - 0.1 K/uL   Immature Granulocytes 1 %   Abs Immature Granulocytes 0.08 (H) 0.00 - 0.07 K/uL    Comment: Performed at [facility], [address] [place], [place] [address]  Glucose, capillary     Status: Abnormal   Collection Time: 10/25/19  8:15 AM  Result Value Ref Range   Glucose-Capillary 190 (H) 70 - 99 mg/dL    Comment: Glucose reference range applies only to samples taken after fasting for at least 8 hours.  Glucose, capillary     Status: Abnormal   Collection Time: 10/25/19 11:44 AM  Result Value Ref Range   Glucose-Capillary 331 (H) 70 - 99 mg/dL    Comment: Glucose reference range applies only to samples taken after fasting for at least 8 hours.  Glucose, capillary     Status: Abnormal   Collection Time: 10/25/19  3:58 PM  Result Value Ref Range   Glucose-Capillary 148 (H) 70 - 99 mg/dL    Comment: Glucose reference range applies only to samples taken after fasting for at least 8 hours.  Glucose, capillary     Status: Abnormal   Collection Time: 10/25/19  9:21 PM  Result Value Ref Range   Glucose-Capillary 190 (H) 70 - 99 mg/dL    Comment: Glucose reference range applies only to samples taken after fasting for at least 8 hours.  CBC with Differential/Platelet     Status: Abnormal   Collection Time: 10/26/19  5:24 AM  Result Value Ref Range   WBC 12.0 (H) 4.0 - 10.5 K/uL   RBC 3.49 (L) 4.22 - 5.81 MIL/uL   Hemoglobin 11.2 (L) 13.0 - 17.0 g/dL   HCT 86.534.3 (L) 78.439.0 - 69.652.0 %   MCV 98.3 80.0 -  100.0 fL   MCH 32.1 26.0 - 34.0 pg   MCHC 32.7 30.0 - 36.0 g/dL   RDW 29.512.0 28.411.5 - 13.215.5 %   Platelets 300 150 - 400 K/uL   nRBC 0.0 0.0 - 0.2 %   Neutrophils Relative % 68 %   Neutro Abs 8.0 (  H) 1.7 - 7.7 K/uL   Lymphocytes Relative 22 %   Lymphs Abs 2.6 0.7 - 4.0 K/uL   Monocytes Relative 8 %   Monocytes Absolute 1.0 0.1 - 1.0 K/uL   Eosinophils Relative 2 %   Eosinophils Absolute 0.3 0.0 - 0.5 K/uL   Basophils Relative 0 %   Basophils Absolute 0.0 0.0 - 0.1 K/uL   Immature Granulocytes 0 %   Abs Immature Granulocytes 0.05 0.00 - 0.07 K/uL    Comment: Performed at [facility], [address] [place], [place] [address]  Basic metabolic panel     Status: Abnormal   Collection Time: 10/26/19  5:24 AM  Result Value Ref Range   Sodium 137 135 - 145 mmol/L   Potassium 4.0 3.5 - 5.1 mmol/L   Chloride 99 98 - 111 mmol/L   CO2 30 22 - 32 mmol/L   Glucose, Bld 151 (H) 70 - 99 mg/dL    Comment: Glucose reference range applies only to samples taken after fasting for at least 8 hours.   BUN 15 6 - 20 mg/dL   Creatinine, Ser 4.09 0.61 - 1.24 mg/dL   Calcium 8.0 (L) 8.9 - 10.3 mg/dL   GFR calc non Af Amer >60 >60 mL/min   GFR calc Af Amer >60 >60 mL/min   Anion gap 8 5 - 15    Comment: Performed at [facility], [address] [place], [place] [address]  Glucose, capillary     Status: Abnormal   Collection Time: 10/26/19  6:28 AM  Result Value Ref Range   Glucose-Capillary 128 (H) 70 - 99 mg/dL    Comment: Glucose reference range applies only to samples taken after fasting for at least 8 hours.  Glucose, capillary     Status: Abnormal   Collection Time: 10/26/19  7:38 AM  Result Value Ref Range   Glucose-Capillary 131 (H) 70 - 99 mg/dL    Comment: Glucose reference range applies only to samples taken after fasting for at least 8 hours.   Comment 1 Notify RN    Comment 2 Document in Chart   Glucose, capillary     Status: Abnormal   Collection Time: 10/26/19  11:31 AM  Result Value Ref Range   Glucose-Capillary 236 (H) 70 - 99 mg/dL    Comment: Glucose reference range applies only to samples taken after fasting for at least 8 hours.   Comment 1 Notify RN    Comment 2 Document in Chart     Current Facility-Administered Medications  Medication Dose Route Frequency Provider Last Rate Last Admin  . acetaminophen (TYLENOL) tablet 650 mg  650 mg Oral Q6H PRN [name], MD      . amLODipine (NORVASC) tablet 10 mg  10 mg Oral Daily [name], MD   10 mg at 10/26/19 0947  . ARIPiprazole (ABILIFY) tablet 5 mg  5 mg Oral Daily Lurene Shadow, MD   5 mg at 10/26/19 0948  . cholecalciferol (VITAMIN D3) tablet 1,000 Units  1,000 Units Oral Daily Lurene Shadow, MD   1,000 Units at 10/26/19 0946  . clopidogrel (PLAVIX) tablet 75 mg  75 mg Oral Daily [name], MD   75 mg at 10/26/19 0946  . dextrose 50 % solution 0-50 mL  0-50 mL Intravenous PRN [name], MD      . docusate sodium (COLACE) capsule 100 mg  100 mg Oral Daily Sakai, Isami, DO   100 mg at  10/25/19 4431  . gabapentin (NEURONTIN) capsule 300 mg  300 mg Oral BID Legrand Rams C, MD   300 mg at 10/26/19 0946  . hydrALAZINE (APRESOLINE) injection 10 mg  10 mg Intravenous Q4H PRN [name], MD   10 mg at 10/20/19 1501  . ibuprofen (ADVIL) tablet 400 mg  400 mg Oral TID WC Esaw Grandchild A, DO   400 mg at 10/26/19 1245  . insulin aspart (novoLOG) injection 0-9 Units  0-9 Units Subcutaneous TID WC Lurene Shadow, MD   3 Units at 10/26/19 1245  . insulin detemir (LEVEMIR) injection 15 Units  15 Units Subcutaneous BID Lurene Shadow, MD   15 Units at 10/26/19 0945  . lidocaine (LIDODERM) 5 % 1 patch  1 patch Transdermal Q24H Sakai, Isami, DO   1 patch at 10/26/19 0948  . lidocaine (XYLOCAINE) 2 % viscous mouth solution 15 mL  15 mL Mouth/Throat Q3H PRN Esaw Grandchild A, DO      . magic mouthwash  10 mL Oral QID Legrand Rams C, MD   10 mL at 10/26/19 0815   And  .  lidocaine (XYLOCAINE) 2 % viscous mouth solution 5 mL  5 mL Mouth/Throat QID [name], MD   5 mL at 10/26/19 0945  . LORazepam (ATIVAN) injection 1 mg  1 mg Intravenous Q6H PRN Jimmye Norman, NP   1 mg at 10/24/19 0754  . LORazepam (ATIVAN) tablet 1 mg  1 mg Oral Once Esaw Grandchild A, DO      . morphine 2 MG/ML injection 2 mg  2 mg Intravenous Q4H PRN Esaw Grandchild A, DO   2 mg at 10/22/19 2344  . multivitamin with minerals tablet 1 tablet  1 tablet Oral Daily [name], MD   1 tablet at 10/26/19 0946  . nicotine (NICODERM CQ - dosed in mg/24 hours) patch 21 mg  21 mg Transdermal Daily [name], MD   21 mg at 10/26/19 0947  . nystatin (MYCOSTATIN) 100000 UNIT/ML suspension 500,000 Units  5 mL Mouth/Throat TID Toney Reil, MD   500,000 Units at 10/26/19 0948  . ondansetron (ZOFRAN) injection 4 mg  4 mg Intravenous Q6H PRN [name], MD   4 mg at 10/20/19 1535  . oxyCODONE-acetaminophen (PERCOCET/ROXICET) 5-325 MG per tablet 2 tablet  2 tablet Oral Q6H Esaw Grandchild A, DO   2 tablet at 10/26/19 1421  . pantoprazole (PROTONIX) EC tablet 40 mg  40 mg Oral BID Lurene Shadow, MD   40 mg at 10/26/19 0946  . polyethylene glycol (MIRALAX / GLYCOLAX) packet 17 g  17 g Oral Daily Sakai, Isami, DO   17 g at 10/25/19 0845  . protein supplement (ENSURE MAX) liquid  11 oz Oral BID BM Lurene Shadow, MD   11 oz at 10/26/19 1125  . sucralfate (CARAFATE) 1 GM/10ML suspension 1 g  1 g Oral TID WC & HS Toney Reil, MD   1 g at 10/26/19 0815  . tamsulosin (FLOMAX) capsule 0.4 mg  0.4 mg Oral Daily [name], MD   0.4 mg at 10/26/19 0946  . traMADol (ULTRAM) tablet 100 mg  100 mg Oral Q6H PRN [name], MD   100 mg at 10/23/19 1617  . vitamin B-12 (CYANOCOBALAMIN) tablet 500 mcg  500 mcg Oral Daily Lurene Shadow, MD   500 mcg at 10/26/19 5400    Musculoskeletal: Strength & Muscle Tone: within normal limits Gait & Station: unable  to  stand Patient leans: N/A  Psychiatric Specialty Exam: Physical Exam  Review of Systems  Blood pressure (!) 164/100, pulse 99, temperature (!) 97.2 F (36.2 C), temperature source Axillary, resp. rate 16, height 5\' 10"  (1.778 m), weight 89.9 kg, SpO2 98 %.Body mass index is 28.44 kg/m.  General Appearance: Fairly Groomed  Eye Contact:  Good  Speech:  Normal Rate  Volume:  Normal  Mood:  Anxious, Depressed, Hopeless, Irritable and Worthless  Affect:  Labile, Full Range and Tearful  Thought Process:  Coherent, Linear and Descriptions of Associations: Tangential  Orientation:  Full (Time, Place, and Person)  Thought Content:  Hallucinations: Auditory Tactile Visual and Rumination  Suicidal Thoughts:  No  Homicidal Thoughts:  No  Memory:  Immediate;   Fair Recent;   Fair  Judgement:  Fair  Insight:  Fair  Psychomotor Activity:  Normal  Concentration:  Concentration: Good  Recall:  Good  Fund of Knowledge:  Good  Language:  Good  Akathisia:  No  Handed:  Right  AIMS (if indicated):     Assets:  Desire for Improvement  ADL's:  Intact  Cognition:  Impaired,  Mild  Sleep:       Treatment Plan Summary: Daily contact with patient to assess and evaluate symptoms and progress in treatment, Medication management and Plan Admit to inpatient psych unit. Patient with no response to anti-pscyhotice in 4 days. He continues to endorse ongoign hallucinations that range from tactile to visual. Will adjust medications at this time and admit for crisis stabilization, medication management, and therapy.    WIll start olanzapine 5mg  po qhs. Discontinue abilify at this time. Recent operative procedures, will limit medications that could cause his hallucinations. Patient has pain medication that will be ordered prn.   Disposition: Recommend psychiatric Inpatient admission when medically cleared.  , FNP

## 2019-10-26 NOTE — BH Assessment (Addendum)
Patient is to be admitted to [facility] by Psychiatric Nurse Practitioner Juel Burrow..  Attending Physician will be Dr. Tamera Punt.   Patient has been assigned to room 305, by [facility] Charge Nurse Lorenda Hatchet.   Intake Paper Work has been signed and placed on patient chart.  Staff is aware of the admission:  Massie Kluver., Patient's Nurse

## 2019-10-26 NOTE — Plan of Care (Signed)
  Problem: Education: Goal: Knowledge of General Education information will improve Description: Including pain rating scale, medication(s)/side effects and non-pharmacologic comfort measures Outcome: Progressing   Problem: Clinical Measurements: Goal: Ability to maintain clinical measurements within normal limits will improve Outcome: Progressing Goal: Will remain free from infection Outcome: Progressing   Problem: Activity: Goal: Risk for activity intolerance will decrease Outcome: Progressing   Problem: Safety: Goal: Ability to remain free from injury will improve Outcome: Progressing   Problem: Skin Integrity: Goal: Risk for impaired skin integrity will decrease Outcome: Progressing   

## 2019-10-26 NOTE — Progress Notes (Signed)
Patient ID: Samuel [name], male   DOB: Mar 06, 1961, 59 y.o.   MRN: [id number]  D: Received patient from medical floor. Patient skin assessment completed with Greig Castilla RN, skin is intact, no contraband found. Patient has amputation of right foot. Patient denies SI / HI. Patient reports visual hallucination when closing eyes.   A: Patient oriented to unit/room/call light. Patient was offered support and encouragement. Patient was encourage to attend groups, participate in unit activities and continue with plan of care. Q x 15 minute observation checks were completed for safety.   R: Patient has no complaints at this time. Patient is receptive to treatment and safety maintained on unit.

## 2019-10-26 NOTE — Plan of Care (Signed)
Patient cleared for discharged, but verbalized concerns of coverage for medications.  Inpatient Psych is on the floor to accept patient.  Preparing for discharge.

## 2019-10-26 NOTE — Discharge Summary (Signed)
Physician Discharge Summary  Samuel [name] WUJ:[id number] DOB: 1961/07/03 DOA: 10/18/2019  PCP: [facility]  Admit date: 10/18/2019 Discharge date: 10/26/2019  Discharge disposition: Behavioral health unit  Recommendations for Outpatient Follow-Up:   Outpatient follow-up with PCP, psychiatrist and urologist   Discharge Diagnosis:   Principal Problem:   Ureterolithiasis Active Problems:   Hyperglycemic crisis in diabetes mellitus (HCC)   Psychosis (HCC)    Discharge Condition: Stable.  Diet recommendation: Diabetic diet  Code status: Full code.    Hospital Course:   Samuel Coursenthony Amos is an 59 y.o. male with history of coronary disease status post stent x1 in 2019, diabetes mellitus type 2 on insulin therapy complicated by DM neuropathy, essential hypertension and history of right-sided nephrolithiasis status post stent in the past. Patient [place] a resident of [place], [place]. He presented on 4/9 with intractable nausea and vomiting, right flank pain. In the ED, glucose 900. Patient had missed some of his insulin since moving.  Imaging studies did reveal right-sided nephrolithiasis with a 5mm distal ureteral stone. He was admitted to hospitalist service for admission and further management.  He had hyperosmolar hyperglycemic state.  He was treated with IV fluids and IV insulin infusion and glucose levels improved.  Of note, he had episodes of mild hypoglycemia dose of Levemir and NovoLog where adjusted.  He was seen in consultation by the urologist, Dr. Gabrielle DareSninski.  He underwent right ureteroscopy and laser lithotripsy, basket extraction of stone fragment, right ureteral stent placement on 10/20/2019.  Right ureteral stent was removed on 10/24/2019.  He had acute urinary retention requiring Foley catheter but this has resolved.  He complains of persistent right flank pain and CT abdomen and pelvis was concerning for right pyelonephritis.   He was also started on empiric IV antibiotics.  However, urine culture did not show any growth.  Urologist, upon reviewing CT scan of abdomen and pelvis, said he did not agree with the diagnosis of pyelonephritis on imaging and he recommended that antibiotics be discontinued.  He was also seen by the gastroenterologist for epigastric pain and food impaction.  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  Biopsy was negative for H. pylori, dysplasia and malignancy.  He was started on vitamin D supplement for vitamin D deficiency.  He has also been started on vitamin B12 supplement for borderline low vitamin B12 levels.  Patient was also seen by the psychiatrist because of depression and hallucinations.  He was started on Abilify.  However, patient continues to have psychiatric symptoms.  Psychiatrist has therefore recommended that patient be transferred to the psychiatric unit for further management.  He is medically stable for transfer to the psychiatric unit. *Plan was discussed with Caryn Beeakia Starkes-Perry, NP with psychiatric department.    Discharge Exam:   Vitals:   10/25/19 2335 10/26/19 0740  BP:  (!) 164/100  Pulse: 89 99  Resp:  16  Temp:  (!) 97.2 F (36.2 C)  SpO2: 100% 98%   Vitals:   10/25/19 1601 10/25/19 2226 10/25/19 2335 10/26/19 0740  BP: 122/78 123/76  (!) 164/100  Pulse: 90 86 89 99  Resp: 16 16  16   Temp: 98.4 F (36.9 C) 98.5 F (36.9 C)  (!) 97.2 F (36.2 C)  TempSrc: Oral Oral  Axillary  SpO2: 93% (!) 89% 100% 98%  Weight:      Height:         GEN: NAD  SKIN: No rash EYES: EOMI ENT: MMM CV: RRR PULM: CTA B ABD: soft, ND, NT, +BS CNS: AAO x 3, non focal EXT: No edema or tenderness   The results of significant diagnostics from this hospitalization (including imaging, microbiology, ancillary and laboratory) are listed below for reference.     Procedures and Diagnostic Studies:   DG Abd 1  View  Result Date: 10/19/2019 CLINICAL DATA:  Right flank pain. EXAM: ABDOMEN - 1 VIEW COMPARISON:  None. FINDINGS: The bowel gas pattern is normal. A moderate amount of stool is seen within the ascending and transverse colon. No radio-opaque calculi or other significant radiographic abnormality are seen. Multiple radiopaque surgical coils are seen overlying the left hemipelvis. IMPRESSION: Negative. Electronically Signed   By: Aram Candela M.D.   On: 10/19/2019 20:05   CT Renal Stone Study  Result Date: 10/18/2019 CLINICAL DATA:  59 year old male with flank pain. Concern for kidney stone. EXAM: CT ABDOMEN AND PELVIS WITHOUT CONTRAST TECHNIQUE: Multidetector CT imaging of the abdomen and pelvis was performed following the standard protocol without IV contrast. COMPARISON:  CT abdomen pelvis dated 10/01/2013. FINDINGS: Evaluation of this exam is limited in the absence of intravenous contrast. Lower chest: The visualized lung bases are clear. No intra-abdominal free air or free fluid. Hepatobiliary: The liver is unremarkable. No intrahepatic biliary ductal dilatation. The gallbladder is unremarkable. Pancreas: Unremarkable. No pancreatic ductal dilatation or surrounding inflammatory changes. Spleen: Normal in size without focal abnormality. Adrenals/Urinary Tract: The adrenal glands are unremarkable. There is a 3 cm left renal inferior pole cyst. There is a 5 mm stone in the distal right ureter. There is minimal right hydronephrosis. No stone identified within the right kidney. There is no hydronephrosis or nephrolithiasis on the left. The left ureter and urinary bladder appear unremarkable. Stomach/Bowel: There is minimal circumferential thickening of the distal esophagus which may be partly related to underdistention or represent mild esophagitis. Clinical correlation is recommended. There is moderate stool throughout the colon. There is no bowel obstruction or active inflammation. Normal appendix.  Vascular/Lymphatic: Minimal aortoiliac atherosclerotic disease. The IVC is unremarkable. No portal venous gas. There is no adenopathy. Reproductive: The prostate and seminal vesicles are grossly unremarkable. No pelvic mass. Scattered calcification of the peripheral corpus cavernosum of the penis noted, progressed since the prior CT. Other: None Musculoskeletal: Osteopenia with degenerative changes of the spine. No acute osseous pathology. IMPRESSION: 1. A 5 mm distal right ureteral stone with minimal right hydronephrosis. 2. No bowel obstruction. Normal appendix. 3. Mild circumferential thickening of the distal esophagus may be related to underdistention or represent mild esophagitis. Clinical correlation is recommended. 4. Aortic Atherosclerosis (ICD10-I70.0). Electronically Signed   By: Elgie Collard M.D.   On: 10/18/2019 19:51     Labs:   Basic Metabolic Panel: Recent Labs  Lab 10/21/19 0715 10/21/19 0715 10/22/19 0523 10/22/19 0523 10/23/19 0426 10/23/19 0426 10/24/19 0653 10/26/19 0524  NA 131*  --  139  --  141  --  135 137  K 4.3   < > 3.5   < > 3.7   < > 3.6 4.0  CL 95*  --  101  --  100  --  97* 99  CO2 25  --  30  --  33*  --  29 30  GLUCOSE 321*  --  208*  --  82  --  84 151*  BUN 21*  --  20  --  18  --  18 15  CREATININE 1.16  --  0.95  --  0.96  --  1.08 0.91  CALCIUM 8.3*  --  8.4*  --  8.3*  --  8.0* 8.0*   < > = values in this interval not displayed.   GFR Estimated Creatinine Clearance: 102.3 mL/min (by C-G formula based on SCr of 0.91 mg/dL). Liver Function Tests: No results for input(s): AST, ALT, ALKPHOS, BILITOT, PROT, ALBUMIN in the last 168 hours. Recent Labs  Lab 10/20/19 0813  LIPASE 11   No results for input(s): AMMONIA in the last 168 hours. Coagulation profile No results for input(s): INR, PROTIME in the last 168 hours.  CBC: Recent Labs  Lab 10/22/19 0523 10/23/19 0426 10/24/19 0653 10/25/19 0430 10/26/19 0524  WBC 12.1* 11.7* 14.6*  11.8* 12.0*  NEUTROABS 9.3* 7.5 10.4* 7.7 8.0*  HGB 12.7* 12.3* 11.8* 10.8* 11.2*  HCT 37.2* 37.3* 36.1* 31.6* 34.3*  MCV 95.4 97.4 98.1 94.9 98.3  PLT 266 288 264 253 300   Cardiac Enzymes: No results for input(s): CKTOTAL, CKMB, CKMBINDEX, TROPONINI in the last 168 hours. BNP: Invalid input(s): POCBNP CBG: Recent Labs  Lab 10/25/19 1558 10/25/19 2121 10/26/19 0628 10/26/19 0738 10/26/19 1131  GLUCAP 148* 190* 128* 131* 236*   D-Dimer No results for input(s): DDIMER in the last 72 hours. Hgb A1c No results for input(s): HGBA1C in the last 72 hours. Lipid Profile No results for input(s): CHOL, HDL, LDLCALC, TRIG, CHOLHDL, LDLDIRECT in the last 72 hours. Thyroid function studies No results for input(s): TSH, T4TOTAL, T3FREE, THYROIDAB in the last 72 hours.  Invalid input(s): FREET3 Anemia work up No results for input(s): VITAMINB12, FOLATE, FERRITIN, TIBC, IRON, RETICCTPCT in the last 72 hours. Microbiology Recent Results (from the past 240 hour(s))  Respiratory Panel by RT PCR (Flu A&B, Covid) - Nasopharyngeal Swab     Status: None   Collection Time: 10/18/19  6:31 PM   Specimen: Nasopharyngeal Swab  Result Value Ref Range Status   SARS Coronavirus 2 by RT PCR NEGATIVE NEGATIVE Final    Comment: (NOTE) SARS-CoV-2 target nucleic acids are NOT DETECTED. The SARS-CoV-2 RNA is generally detectable in upper respiratoy specimens during the acute phase of infection. The lowest concentration of SARS-CoV-2 viral copies this assay can detect is 131 copies/mL. A negative result does not preclude SARS-Cov-2 infection and should not be used as the sole basis for treatment or other patient management decisions. A negative result may occur with  improper specimen collection/handling, submission of specimen other than nasopharyngeal swab, presence of viral mutation(s) within the areas targeted by this assay, and inadequate number of viral copies (<131 copies/mL). A negative result  must be combined with clinical observations, patient history, and epidemiological information. The expected result is Negative. Fact Sheet for Patients:  https://www.moore.com/ Fact Sheet for Healthcare Providers:  https://www.young.biz/ This test is not yet ap proved or cleared by the [place] FDA and  has been authorized for detection and/or diagnosis of SARS-CoV-2 by FDA under an Emergency Use Authorization (EUA). This EUA will remain  in effect (meaning this test can be used) for the duration of the COVID-19 declaration under Section 564(b)(1) of the Act, 21 U.S.C. section 360bbb-3(b)(1), unless the authorization is terminated or revoked sooner.    Influenza A by PCR NEGATIVE NEGATIVE Final   Influenza B by PCR NEGATIVE NEGATIVE Final    Comment: (NOTE) The Xpert Xpress SARS-CoV-2/FLU/RSV assay is intended as an aid in  the diagnosis of influenza from Nasopharyngeal swab specimens and  should not be used  as a sole basis for treatment. Nasal washings and  aspirates are unacceptable for Xpert Xpress SARS-CoV-2/FLU/RSV  testing. Fact Sheet for Patients: PinkCheek.be Fact Sheet for Healthcare Providers: GravelBags.it This test is not yet approved or cleared by the [place] FDA and  has been authorized for detection and/or diagnosis of SARS-CoV-2 by  FDA under an Emergency Use Authorization (EUA). This EUA will remain  in effect (meaning this test can be used) for the duration of the  Covid-19 declaration under Section 564(b)(1) of the Act, 21  U.S.C. section 360bbb-3(b)(1), unless the authorization is  terminated or revoked. Performed at [facility], [address] [place], [place] [address]   CULTURE, BLOOD (ROUTINE X 2) w Reflex to ID Panel     Status: None   Collection Time: 10/20/19  8:23 AM   Specimen: BLOOD  Result Value Ref Range Status   Specimen  Description BLOOD BLOOD RIGHT WRIST  Final   Special Requests   Final    BOTTLES DRAWN AEROBIC AND ANAEROBIC Blood Culture adequate volume   Culture   Final    NO GROWTH 5 DAYS Performed at [facility], [address] [place], [place] [address]    Report Status 10/25/2019 FINAL  Final  CULTURE, BLOOD (ROUTINE X 2) w Reflex to ID Panel     Status: None   Collection Time: 10/20/19 11:14 AM   Specimen: BLOOD  Result Value Ref Range Status   Specimen Description BLOOD LEFT ANTECUBITAL  Final   Special Requests   Final    BOTTLES DRAWN AEROBIC AND ANAEROBIC Blood Culture adequate volume   Culture   Final    NO GROWTH 5 DAYS Performed at [facility], [address], [place], [place] [address]    Report Status 10/25/2019 FINAL  Final  KOH prep     Status: None   Collection Time: 10/21/19  3:16 PM   Specimen: Esophagus  Result Value Ref Range Status   Specimen Description ESOPHAGUS  Final   Special Requests Normal  Final   KOH Prep   Final    NO YEAST OR FUNGAL ELEMENTS SEEN Performed at [facility], [address] [place], [place] [address]    Report Status 10/21/2019 FINAL  Final  Urine Culture     Status: None   Collection Time: 10/24/19 10:24 AM   Specimen: Urine, Clean Catch  Result Value Ref Range Status   Specimen Description   Final    URINE, CLEAN CATCH Performed at [facility], [address], [place], [place] [address]    Special Requests   Final    NONE Performed at [facility], [address] [place], [place] [address]    Culture   Final    NO GROWTH Performed at [facility], [place] [address] [place],  [address]    Report Status 10/25/2019 FINAL  Final     Discharge Instructions:   Discharge Instructions    Diet - low sodium heart healthy   Complete by: As directed    Increase activity slowly   Complete by: As directed      Allergies as of 10/26/2019   No Known Allergies       Medication List    STOP taking these medications   famotidine 20 MG tablet Commonly known as: PEPCID     TAKE these medications   acetaminophen 325 MG tablet Commonly known as: TYLENOL Take 2 tablets (650 mg total) by  mouth every 6 (six) hours as needed for mild pain, fever or headache.   amLODipine 10 MG tablet Commonly known as: NORVASC Take 1 tablet (10 mg total) by mouth daily.   ARIPiprazole 5 MG tablet Commonly known as: ABILIFY Take 1 tablet (5 mg total) by mouth daily. Start taking on: October 27, 2019   aspirin EC 81 MG tablet Take 81 mg by mouth daily.   clopidogrel 75 MG tablet Commonly known as: PLAVIX Take 1 tablet (75 mg total) by mouth daily.   gabapentin 300 MG capsule Commonly known as: NEURONTIN Take 1 capsule (300 mg total) by mouth 2 (two) times daily.   hydrochlorothiazide 25 MG tablet Commonly known as: HYDRODIURIL Take 1 tablet (25 mg total) by mouth daily.   insulin detemir 100 UNIT/ML injection Commonly known as: LEVEMIR Inject 0.15 mLs (15 Units total) into the skin 2 (two) times daily. What changed: how much to take   lidocaine 2 % solution Commonly known as: XYLOCAINE Use as directed 15 mLs in the mouth or throat every 3 (three) hours as needed for mouth pain.   nicotine 21 mg/24hr patch Commonly known as: NICODERM CQ - dosed in mg/24 hours Place 1 patch (21 mg total) onto the skin daily. Start taking on: October 27, 2019   nystatin 100000 UNIT/ML suspension Commonly known as: MYCOSTATIN Use as directed 5 mLs (500,000 Units total) in the mouth or throat 4 (four) times daily.   oxyCODONE-acetaminophen 5-325 MG tablet Commonly known as: PERCOCET/ROXICET Take 1 tablet by mouth every 8 (eight) hours as needed for up to 4 days for moderate pain.   pantoprazole 40 MG tablet Commonly known as: PROTONIX Take 1 tablet (40 mg total) by mouth 2 (two) times daily.   polyethylene glycol 17 g packet Commonly known as: MIRALAX / GLYCOLAX Take  17 g by mouth daily as needed for moderate constipation.   tamsulosin 0.4 MG Caps capsule Commonly known as: FLOMAX Take 1 capsule (0.4 mg total) by mouth daily. Start taking on: October 27, 2019   vitamin B-12 500 MCG tablet Commonly known as: CYANOCOBALAMIN Take 1 tablet (500 mcg total) by mouth daily. Start taking on: October 27, 2019   Vitamin D3 25 MCG tablet Commonly known as: Vitamin D Take 1 tablet (1,000 Units total) by mouth daily. Start taking on: October 27, 2019      Follow-up Information    [facility]. Schedule an appointment as soon as possible for a visit in 1 week(s).   Specialty: General Practice Contact information: [address] [place] [address] [phone]            Time coordinating discharge: 33 minutes  Signed:  Lurene Shadow  [facility] 10/26/2019, 4:24 PM

## 2019-10-26 NOTE — Tx Team (Signed)
Initial Treatment Plan 10/26/2019 6:44 PM Azalee Course [id number]    PATIENT STRESSORS: Financial difficulties Health problems   PATIENT STRENGTHS: Ability for insight Active sense of humor Communication skills   PATIENT IDENTIFIED PROBLEMS: Visual Hallucinations   Ineffective coping skills                   DISCHARGE CRITERIA:  Ability to meet basic life and health needs Adequate post-discharge living arrangements  PRELIMINARY DISCHARGE PLAN: Return to previous living arrangement Return to previous work or school arrangements  PATIENT/FAMILY INVOLVEMENT: This treatment plan has been presented to and reviewed with the patient, Samuel [name].  The patient and family have been given the opportunity to ask questions and make suggestions.  Berkley Harvey, RN 10/26/2019, 6:44 PM

## 2019-10-27 DIAGNOSIS — F19951 Other psychoactive substance use, unspecified with psychoactive substance-induced psychotic disorder with hallucinations: Secondary | ICD-10-CM | POA: Diagnosis not present

## 2019-10-27 DIAGNOSIS — R410 Disorientation, unspecified: Secondary | ICD-10-CM | POA: Diagnosis not present

## 2019-10-27 DIAGNOSIS — E1143 Type 2 diabetes mellitus with diabetic autonomic (poly)neuropathy: Secondary | ICD-10-CM | POA: Diagnosis not present

## 2019-10-27 DIAGNOSIS — E1165 Type 2 diabetes mellitus with hyperglycemia: Secondary | ICD-10-CM

## 2019-10-27 DIAGNOSIS — I10 Essential (primary) hypertension: Secondary | ICD-10-CM

## 2019-10-27 DIAGNOSIS — T50905A Adverse effect of unspecified drugs, medicaments and biological substances, initial encounter: Secondary | ICD-10-CM

## 2019-10-27 LAB — GLUCOSE, CAPILLARY
Glucose-Capillary: 120 mg/dL — ABNORMAL HIGH (ref 70–99)
Glucose-Capillary: 235 mg/dL — ABNORMAL HIGH (ref 70–99)
Glucose-Capillary: 159 mg/dL — ABNORMAL HIGH (ref 70–99)
Glucose-Capillary: 133 mg/dL — ABNORMAL HIGH (ref 70–99)
Glucose-Capillary: 221 mg/dL — ABNORMAL HIGH (ref 70–99)

## 2019-10-27 MED ORDER — INSULIN DETEMIR 100 UNIT/ML ~~LOC~~ SOLN
20.0000 [IU] | Freq: Two times a day (BID) | SUBCUTANEOUS | Status: DC
  Administered 2019-10-27 – 2019-10-28 (×2): 20 [IU] via SUBCUTANEOUS
  Filled 2019-10-27 (×4): qty 0.2

## 2019-10-27 NOTE — [facility] Suicide Risk Assessment (Signed)
[facility] Admission Suicide Risk Assessment   Nursing information obtained from:  Patient Demographic factors:  Male Current Mental Status:  NA Loss Factors:  NA Historical Factors:  NA Risk Reduction Factors:  NA  Total Time spent with patient: 45 minutes Principal Problem: <principal problem not specified> Diagnosis:  Active Problems:   MDD (major depressive disorder), single episode, severe with psychosis ([place])  Subjective Data: Patient is seen and examined.  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  At that time he had intractable nausea and vomiting.  It was noted that he had a right sided nephrolithiasis with a 5 mm distal ureteral stone.  He was referred to the hospitalist service for admission and management.  Urology was consulted.  He apparently underwent right ureteroscopy, laser lithotripsy and stent placement.  He was given anesthesia at that time.  There were no complications of the procedure, but the patient began to develop delusionary/hallucinatory symptoms.  He stated that when he would close his eyes he would see the faces of people and other images.  After this the patient developed some throbbing epigastric pain, and eventually underwent upper GI endoscopy.  This was for foreign body in the esophagus.  He underwent anesthesia for this as well.  He developed significant anxiety and worsening of these delusionary/hallucinatory problems.  He continued to have problems with his blood sugar throughout the course of the hospitalization.  A psychiatric consultation was obtained on  10/23/2019.  At that time it was noted in the consultation that the patient was irritable and agitated.  He would then become acutely tearful.  He was thought to be labile.  It was felt that he had major depression with possible psychosis.  He was stabilized medically, and reconsultation was done on 10/26/2019.  It was noted that his lability and agitation had decreased, but he continued to have these delusionary symptoms.  He was transferred to psychiatry for evaluation and stabilization.  This a.m. he denied any problems.  He denied any suicidal or homicidal ideation.  He denied any auditory or visual hallucinations.  The patient blamed his repeated anesthesia and pain medications on his psychiatric symptoms.  He denied any previous psychiatric evaluations, psychiatric treatment, psychiatric admissions.  He admitted that he had had a history of amphetamine use many years ago.  He had not used substances or alcohol in many years.  He stated that the medication he received last night (Zyprexa 5 mg p.o. nightly) helped him sleep better than he had during the course the hospitalization.  He attributes most of his symptoms to the anesthesia, lack of sleep and pain medication.  Continued Clinical Symptoms:  Alcohol Use Disorder Identification Test Final Score (AUDIT): 0 The "Alcohol Use Disorders Identification Test", Guidelines for Use in Primary Care, Second Edition.  World Pharmacologist [facility]). Score between 0-7:  no or low risk or alcohol related problems. Score between 8-15:  moderate risk of alcohol related problems. Score between 16-19:  high risk of alcohol related problems. Score 20 or above:  warrants further diagnostic evaluation for alcohol dependence and treatment.   CLINICAL FACTORS:   Currently Psychotic   Musculoskeletal: Strength & Muscle Tone: within normal limits Gait &  Station: normal Patient leans: N/A  Psychiatric Specialty Exam: Physical Exam  Nursing note and vitals  reviewed. Constitutional: He is oriented to person, place, and time. He appears well-developed and well-nourished.  HENT:  Head: Normocephalic and atraumatic.  Respiratory: Effort normal.  Neurological: He is alert and oriented to person, place, and time.    Review of Systems  Blood pressure 138/79, pulse 89, temperature 97.9 F (36.6 C), temperature source Oral, resp. rate 18, height 5\' 9"  (1.753 m), weight 95.3 kg, SpO2 100 %.Body mass index is 31.01 kg/m.  General Appearance: Casual  Eye Contact:  Fair  Speech:  Normal Rate  Volume:  Normal  Mood:  Euthymic  Affect:  Congruent  Thought Process:  Coherent and Descriptions of Associations: Intact  Orientation:  Full (Time, Place, and Person)  Thought Content:  Logical  Suicidal Thoughts:  No  Homicidal Thoughts:  No  Memory:  Immediate;   Good Recent;   Good Remote;   Good  Judgement:  Intact  Insight:  Fair  Psychomotor Activity:  Normal  Concentration:  Concentration: Good and Attention Span: Good  Recall:  Good  Fund of Knowledge:  Good  Language:  Good  Akathisia:  Negative  Handed:  Right  AIMS (if indicated):     Assets:  Desire for Improvement Housing Resilience  ADL's:  Intact  Cognition:  WNL  Sleep:  Number of Hours: 6.25      COGNITIVE FEATURES THAT CONTRIBUTE TO RISK:  None    SUICIDE RISK:   Minimal: No identifiable suicidal ideation.  Patients presenting with no risk factors but with morbid ruminations; may be classified as minimal risk based on the severity of the depressive symptoms  PLAN OF CARE: Patient is seen and examined.  Patient is a 59 year old male with the above-stated past medical and psychiatric history who was transferred to the psychiatric unit for continued treatment secondary to mood lability and apparent psychosis.  He received Zyprexa last night, is doing well this morning.  He denied any suicidal or homicidal ideation.  He denied any of the delusionary/hallucinatory symptoms that  he had on the floor.  He is appropriate.  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  We will continue the Zyprexa at 5 mg p.o. nightly.  He is on multiple medications for his diabetes as well as other medical problems including coronary artery disease and nephrolithiasis.  Review of his laboratories revealed his blood sugar this morning to be 235.  His hemoglobin A1c was 15.5.  Liver function enzymes were normal.  His creatinine was normal at 0.91.  On 4/17 his white blood cell count was slightly elevated at 12, and he is mildly anemic with a hemoglobin of 11.2 and hematocrit of 34.3.  Platelets were normal at 300,000.  His differential showed a mildly elevated absolute neutrophil count at 8.  Urine culture was negative.  An EKG from 4/9 showed a sinus tachycardia with a mildly prolonged QTc interval.  We will repeat that.  His vital signs are stable, he is afebrile.  He did sleep 6.25 hours last night.  He stated at home that he is on Lantus insulin twice daily, and does not take supplemental insulin.  I will increase his Levemir to 20 units subcu twice daily.  No change in the sliding scale at this point.  He continues on Plavix for his coronary artery stent.  He also remains on amlodipine, gabapentin, hydralazine as needed.  If everything goes well  over the next 24 hours I would not be surprised that he would be ready for discharge home.  I certify that inpatient services furnished can reasonably be expected to improve the patient's condition.   Antonieta Pert, MD 10/27/2019, 11:46 AM

## 2019-10-27 NOTE — Plan of Care (Signed)
D- Patient alert and oriented. Patient presented in a pleasant mood on assessment stating that he slept so good last night that he had an episode of incontinence. Patient continues to have complaints of mouth pain, rating it a "8/10", in which he has requested pain medication from this writer throughout the day. Patient denied SI, HI, AVH at this time. Patient also denied any signs/symptoms of depression/anxiety. Patient had no stated goals for today.  A- Scheduled medications administered to patient, per MD orders. Support and encouragement provided.  Routine safety checks conducted every 15 minutes.  Patient informed to notify staff with problems or concerns.  R- No adverse drug reactions noted. Patient contracts for safety at this time. Patient compliant with medications and treatment plan. Patient receptive, calm, and cooperative. Patient interacts well with others on the unit.  Patient remains safe at this time.  Problem: Education: Goal: Knowledge of [facility] Education information/materials will improve Outcome: Progressing Goal: Emotional status will improve Outcome: Progressing Goal: Mental status will improve Outcome: Progressing Goal: Verbalization of understanding the information provided will improve Outcome: Progressing

## 2019-10-27 NOTE — H&P (Signed)
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  At that time he had intractable nausea and vomiting.  It was noted that he had a right sided nephrolithiasis with a 5 mm distal ureteral stone.  He was referred to the hospitalist service for admission and management.  Urology was consulted.  He apparently underwent right ureteroscopy, laser lithotripsy and stent placement.  He was given anesthesia at that time.  There were no complications of the procedure, but the patient began to develop delusionary/hallucinatory symptoms.  He stated that when he would close his eyes he would see the faces of people and other images.  After this the patient developed some throbbing epigastric pain, and eventually underwent upper GI endoscopy.  This was for foreign body in the esophagus.  He underwent anesthesia for this as well.  He developed significant anxiety and worsening of these  delusionary/hallucinatory problems.  He continued to have problems with his blood sugar throughout the course of the hospitalization.  A psychiatric consultation was obtained on 10/23/2019.  At that time it was noted in the consultation that the patient was irritable and agitated.  He would then become acutely tearful.  He was thought to be labile.  It was felt that he had major depression with possible psychosis.  He was stabilized medically, and reconsultation was done on 10/26/2019.  It was noted that his lability and agitation had decreased, but he continued to have these delusionary symptoms.  He was transferred to psychiatry for evaluation and stabilization.  This a.m. he denied any problems.  He denied any suicidal or homicidal ideation.  He denied any auditory or visual hallucinations.  The patient blamed his repeated anesthesia and pain medications on his psychiatric symptoms.  He denied any previous psychiatric evaluations, psychiatric treatment, psychiatric admissions.  He admitted that he had had a history of amphetamine use many years ago.  He had not used substances or alcohol in many years.  He stated that the medication he received last night (Zyprexa 5 mg p.o. nightly) helped him sleep better than he had during the course the hospitalization.  He attributes most of his symptoms to the anesthesia, lack of sleep and pain medication.  Associated Signs/Symptoms: Depression Symptoms:  insomnia, psychomotor agitation, anxiety, disturbed sleep, (Hypo) Manic Symptoms:  Hallucinations, Anxiety Symptoms:  Denied Psychotic Symptoms:  Delusions, Hallucinations: Visual PTSD Symptoms: Negative Total Time spent with patient: 1 hour  Past Psychiatric History: Patient denied any previous psychiatric evaluations, previous psychiatric admissions, previous psychiatric medications.  Is the patient at risk to self? No.  Has the patient been a risk to self in the past 6 months? No.  Has the patient  been a  risk to self within the distant past? No.  Is the patient a risk to others? No.  Has the patient been a risk to others in the past 6 months? No.  Has the patient been a risk to others within the distant past? No.   Prior Inpatient Therapy:   Prior Outpatient Therapy:    Alcohol Screening: Patient refused Alcohol Screening Tool: Yes 1. How often do you have a drink containing alcohol?: Never 2. How many drinks containing alcohol do you have on a typical day when you are drinking?: 1 or 2 3. How often do you have six or more drinks on one occasion?: Never AUDIT-C Score: 0 4. How often during the last year have you found that you were not able to stop drinking once you had started?: Never 5. How often during the last year have you failed to do what was normally expected from you becasue of drinking?: Never 6. How often during the last year have you needed a first drink in the morning to get yourself going after a heavy drinking session?: Never 7. How often during the last year have you had a feeling of guilt of remorse after drinking?: Never 8. How often during the last year have you been unable to remember what happened the night before because you had been drinking?: Never 9. Have you or someone else been injured as a result of your drinking?: No 10. Has a relative or friend or a doctor or another health worker been concerned about your drinking or suggested you cut down?: No Alcohol Use Disorder Identification Test Final Score (AUDIT): 0 Alcohol Brief Interventions/Follow-up: AUDIT Score <7 follow-up not indicated Substance Abuse History in the last 12 months:  No. Consequences of Substance Abuse: Negative Previous Psychotropic Medications: No  Psychological Evaluations: No  Past Medical History:  Past Medical History:  Diagnosis Date  . Diabetes mellitus without complication (HCC)   . Hypertension   . UTI (urinary tract infection)     Past Surgical History:  Procedure Laterality  Date  . CORONARY ANGIOPLASTY WITH STENT PLACEMENT    . CYSTOSCOPY/URETEROSCOPY/HOLMIUM LASER/STENT PLACEMENT Right 10/20/2019   Procedure: CYSTOSCOPY/URETEROSCOPY/HOLMIUM LASER/STENT PLACEMENT;  Surgeon: Sondra ComeSninsky, Brian C, MD;  Location: [facility] ORS;  Service: Urology;  Laterality: Right;  . ESOPHAGOGASTRODUODENOSCOPY N/A 10/21/2019   Procedure: ESOPHAGOGASTRODUODENOSCOPY (EGD);  Surgeon: Toney ReilVanga, Rohini Reddy, MD;  [facility];  Service: Gastroenterology;  Laterality: N/A;  . FOOT AMPUTATION    . HERNIA REPAIR    . INCISION AND DRAINAGE PERIRECTAL ABSCESS N/A 09/20/2019   Procedure: IRRIGATION AND DEBRIDEMENT PERIRECTAL ABSCESS;  Surgeon: Carolan Shiverintron-Diaz, Edgardo, MD;  Location: [facility] ORS;  Service: General;  Laterality: N/A;   Family History: History reviewed. No pertinent family history. Family Psychiatric  History: Denied Tobacco Screening: Have you used any form of tobacco in the last 30 days? (Cigarettes, Smokeless Tobacco, Cigars, and/or Pipes): No Social History:  Social History   Substance and Sexual Activity  Alcohol Use Not Currently     Social History   Substance and Sexual Activity  Drug Use Not on file    Additional Social History: Marital status: Divorced Divorced, when?: 2008 Are you sexually active?: No What is your sexual orientation?: heterosexual Has your sexual activity been affected by drugs, alcohol, medication, or emotional stress?: na Does patient have children?: Yes How many children?: 1 How is patient's relationship with their children?: 57 year old son  Allergies:  No Known Allergies Lab Results:  Results for orders placed or performed during the hospital encounter of 10/26/19 (from the past 48 hour(s))  Glucose, capillary     Status: Abnormal   Collection Time: 10/26/19  8:38 PM  Result Value Ref Range   Glucose-Capillary 186 (H) 70 - 99 mg/dL    Comment: Glucose reference range applies only to samples taken  after fasting for at least 8 hours.  Glucose, capillary     Status: Abnormal   Collection Time: 10/27/19  7:27 AM  Result Value Ref Range   Glucose-Capillary 120 (H) 70 - 99 mg/dL    Comment: Glucose reference range applies only to samples taken after fasting for at least 8 hours.  Glucose, capillary     Status: Abnormal   Collection Time: 10/27/19 11:08 AM  Result Value Ref Range   Glucose-Capillary 235 (H) 70 - 99 mg/dL    Comment: Glucose reference range applies only to samples taken after fasting for at least 8 hours.  Glucose, capillary     Status: Abnormal   Collection Time: 10/27/19  1:42 PM  Result Value Ref Range   Glucose-Capillary 159 (H) 70 - 99 mg/dL    Comment: Glucose reference range applies only to samples taken after fasting for at least 8 hours.   Comment 1 Notify RN     Blood Alcohol level:  No results found for: [facility]  Metabolic Disorder Labs:  Lab Results  Component Value Date   HGBA1C >15.5 (H) 10/19/2019   MPG >398 10/19/2019   No results found for: PROLACTIN No results found for: CHOL, TRIG, HDL, CHOLHDL, VLDL, LDLCALC  Current Medications: Current Facility-Administered Medications  Medication Dose Route Frequency Provider Last Rate Last Admin  . acetaminophen (TYLENOL) tablet 650 mg  650 mg Oral Q6H PRN Maryagnes Amos, FNP      . alum & mag hydroxide-simeth (MAALOX/MYLANTA) 200-200-20 MG/5ML suspension 30 mL  30 mL Oral Q4H PRN Starkes-Perry, Juel Burrow, FNP      . amLODipine (NORVASC) tablet 10 mg  10 mg Oral Daily Maryagnes Amos, FNP   10 mg at 10/27/19 0844  . cholecalciferol (VITAMIN D3) tablet 1,000 Units  1,000 Units Oral Daily Maryagnes Amos, FNP   1,000 Units at 10/27/19 0844  . clopidogrel (PLAVIX) tablet 75 mg  75 mg Oral Daily Maryagnes Amos, FNP   75 mg at 10/27/19 0843  . docusate sodium (COLACE) capsule 100 mg  100 mg Oral Daily Starkes-Perry, Takia S, FNP      . gabapentin (NEURONTIN) capsule 300 mg  300 mg  Oral BID Maryagnes Amos, FNP   300 mg at 10/27/19 0844  . hydrALAZINE (APRESOLINE) tablet 10 mg  10 mg Oral Q8H PRN Starkes-Perry, Takia S, FNP      . ibuprofen (ADVIL) tablet 400 mg  400 mg Oral TID WC Starkes-Perry, Juel Burrow, FNP      . insulin aspart (novoLOG) injection 0-9 Units  0-9 Units Subcutaneous TID WC Maryagnes Amos, FNP   3 Units at 10/27/19 1109  . insulin detemir (LEVEMIR) injection 20 Units  20 Units Subcutaneous BID Antonieta Pert, MD      . lidocaine (LIDODERM) 5 % 1 patch  1 patch Transdermal Q24H Maryagnes Amos, FNP   1 patch at 10/27/19 1104  . lidocaine (XYLOCAINE) 2 % viscous mouth solution 15 mL  15 mL Mouth/Throat Q3H PRN Maryagnes Amos, FNP   15 mL at 10/27/19  1105  . magnesium hydroxide (MILK OF MAGNESIA) suspension 30 mL  30 mL Oral Daily PRN Starkes-Perry, Juel Burrow, FNP      . multivitamin with minerals tablet 1 tablet  1 tablet Oral Daily Maryagnes Amos, FNP   1 tablet at 10/27/19 0844  . nicotine (NICODERM CQ - dosed in mg/24 hours) patch 21 mg  21 mg Transdermal Daily Maryagnes Amos, FNP   21 mg at 10/27/19 0843  . nystatin (MYCOSTATIN) 100000 UNIT/ML suspension 500,000 Units  5 mL Mouth/Throat TID Maryagnes Amos, FNP   500,000 Units at 10/27/19 1145  . OLANZapine (ZYPREXA) tablet 5 mg  5 mg Oral QHS Maryagnes Amos, FNP   5 mg at 10/26/19 2109  . oxyCODONE-acetaminophen (PERCOCET/ROXICET) 5-325 MG per tablet 1-2 tablet  1-2 tablet Oral Q8H PRN Maryagnes Amos, FNP   2 tablet at 10/27/19 1104  . pantoprazole (PROTONIX) EC tablet 40 mg  40 mg Oral BID Maryagnes Amos, FNP   40 mg at 10/27/19 0844  . polyethylene glycol (MIRALAX / GLYCOLAX) packet 17 g  17 g Oral Daily Starkes-Perry, Juel Burrow, FNP      . protein supplement (ENSURE MAX) liquid  11 oz Oral BID BM Starkes-Perry, Juel Burrow, FNP      . tamsulosin (FLOMAX) capsule 0.4 mg  0.4 mg Oral Daily Maryagnes Amos, FNP   0.4 mg at  10/27/19 1145  . vitamin B-12 (CYANOCOBALAMIN) tablet 500 mcg  500 mcg Oral Daily Maryagnes Amos, FNP   500 mcg at 10/27/19 2035   PTA Medications: Medications Prior to Admission  Medication Sig Dispense Refill Last Dose  . acetaminophen (TYLENOL) 325 MG tablet Take 2 tablets (650 mg total) by mouth every 6 (six) hours as needed for mild pain, fever or headache.     Marland Kitchen amLODipine (NORVASC) 10 MG tablet Take 1 tablet (10 mg total) by mouth daily. 30 tablet 0   . ARIPiprazole (ABILIFY) 5 MG tablet Take 1 tablet (5 mg total) by mouth daily. 30 tablet 0   . aspirin EC 81 MG tablet Take 81 mg by mouth daily.     . cholecalciferol (VITAMIN D) 25 MCG tablet Take 1 tablet (1,000 Units total) by mouth daily. 30 tablet 0   . clopidogrel (PLAVIX) 75 MG tablet Take 1 tablet (75 mg total) by mouth daily. 30 tablet 0   . gabapentin (NEURONTIN) 300 MG capsule Take 1 capsule (300 mg total) by mouth 2 (two) times daily. 60 capsule 0   . hydrochlorothiazide (HYDRODIURIL) 25 MG tablet Take 1 tablet (25 mg total) by mouth daily. 30 tablet 0   . insulin detemir (LEVEMIR) 100 UNIT/ML injection Inject 0.15 mLs (15 Units total) into the skin 2 (two) times daily. 10 mL 0   . lidocaine (XYLOCAINE) 2 % solution Use as directed 15 mLs in the mouth or throat every 3 (three) hours as needed for mouth pain. 100 mL 0   . nicotine (NICODERM CQ - DOSED IN MG/24 HOURS) 21 mg/24hr patch Place 1 patch (21 mg total) onto the skin daily. 28 patch 0   . nystatin (MYCOSTATIN) 100000 UNIT/ML suspension Use as directed 5 mLs (500,000 Units total) in the mouth or throat 4 (four) times daily. 60 mL 0   . oxyCODONE-acetaminophen (PERCOCET/ROXICET) 5-325 MG tablet Take 1 tablet by mouth every 8 (eight) hours as needed for up to 4 days for moderate pain. 12 tablet 0   . pantoprazole (PROTONIX) 40 MG  tablet Take 1 tablet (40 mg total) by mouth 2 (two) times daily. 60 tablet 0   . polyethylene glycol (MIRALAX / GLYCOLAX) 17 g packet Take  17 g by mouth daily as needed for moderate constipation. 14 each 0   . tamsulosin (FLOMAX) 0.4 MG CAPS capsule Take 1 capsule (0.4 mg total) by mouth daily. 30 capsule 0   . vitamin B-12 (CYANOCOBALAMIN) 500 MCG tablet Take 1 tablet (500 mcg total) by mouth daily. 30 tablet 0     Musculoskeletal: Strength & Muscle Tone: within normal limits Gait & Station: normal Patient leans: N/A  Psychiatric Specialty Exam: Physical Exam  Nursing note and vitals reviewed. Constitutional: He is oriented to person, place, and time. He appears well-developed and well-nourished.  HENT:  Head: Normocephalic and atraumatic.  Respiratory: Effort normal.  Neurological: He is alert and oriented to person, place, and time.    Review of Systems  Blood pressure 138/79, pulse 89, temperature 97.9 F (36.6 C), temperature source Oral, resp. rate 18, height  (1.753 m), weight 95.3 kg, SpO2 100 %.Body mass index is 31.01 kg/m.  General Appearance: Disheveled  Eye Contact:  Good  Speech:  Normal Rate  Volume:  Normal  Mood:  Euthymic  Affect:  Congruent  Thought Process:  Coherent and Descriptions of Associations: Intact  Orientation:  Full (Time, Place, and Person)  Thought Content:  Logical  Suicidal Thoughts:  No  Homicidal Thoughts:  No  Memory:  Immediate;   Good Recent;   Good Remote;   Good  Judgement:  Fair  Insight:  Fair  Psychomotor Activity:  Normal  Concentration:  Concentration: Good and Attention Span: Good  Recall:  Good  Fund of Knowledge:  Good  Language:  Good  Akathisia:  Negative  Handed:  Right  AIMS (if indicated):     Assets:  Desire for Improvement Resilience  ADL's:  Intact  Cognition:  WNL  Sleep:  Number of Hours: 6.25    Treatment Plan Summary: Daily contact with patient to assess and evaluate symptoms and progress in treatment, Medication management and Plan :  Patient is seen and examined.  Patient is a 59 year old male with the above-stated past medical  and psychiatric history who was transferred to the psychiatric unit for continued treatment secondary to mood lability and apparent psychosis.  He received Zyprexa last night, is doing well this morning.  He denied any suicidal or homicidal ideation.  He denied any of the delusionary/hallucinatory symptoms that he had on the floor.  He is appropriate.  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  We will continue the Zyprexa at 5 mg p.o. nightly.  He is on multiple medications for his diabetes as well as other medical problems including coronary artery disease and nephrolithiasis.  Review of his laboratories revealed his blood sugar this morning to be 235.  His hemoglobin A1c was 15.5.  Liver function enzymes were normal.  His creatinine was normal at 0.91.  On 4/17 his white blood cell count was slightly elevated at 12, and he is mildly anemic with a hemoglobin of 11.2 and hematocrit of 34.3.  Platelets were normal at 300,000.  His differential showed a mildly elevated absolute neutrophil count at 8.  Urine culture was negative.  An EKG from 4/9 showed a sinus tachycardia with a mildly prolonged QTc interval.  We will repeat that.  His vital signs are stable, he is afebrile.  He did sleep 6.25 hours last  night.  He stated at home that he is on Lantus insulin twice daily, and does not take supplemental insulin.  I will increase his Levemir to 20 units subcu twice daily.  No change in the sliding scale at this point.  He continues on Plavix for his coronary artery stent.  He also remains on amlodipine, gabapentin, hydralazine as needed.  If everything goes well over the next 24 hours I would not be surprised that he would be ready for discharge home.  Observation Level/Precautions:  15 minute checks  Laboratory:  Chemistry Profile  Psychotherapy:    Medications:    Consultations:    Discharge Concerns:    Estimated LOS:  Other:     Physician Treatment Plan for  Primary Diagnosis: <principal problem not specified> Long Term Goal(s): Improvement in symptoms so as ready for discharge  Short Term Goals: Ability to identify changes in lifestyle to reduce recurrence of condition will improve, Ability to verbalize feelings will improve, Ability to demonstrate self-control will improve, Ability to identify and develop effective coping behaviors will improve and Ability to maintain clinical measurements within normal limits will improve  Physician Treatment Plan for Secondary Diagnosis: Active Problems:   MDD (major depressive disorder), single episode, severe with psychosis ([place])   Substance-induced psychotic disorder with hallucinations ([place])   Delirium, drug-induced   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    Sharma Covert, MD 4/18/20212:29 PM

## 2019-10-27 NOTE — [facility] Suicide Risk Assessment (Signed)
[facility] INPATIENT:  Family/Significant Other Suicide Prevention Education  Suicide Prevention Education:  Patient Refusal for Family/Significant Other Suicide Prevention Education: The patient Samuel [name] has refused to provide written consent for family/significant other to be provided Family/Significant Other Suicide Prevention Education during admission and/or prior to discharge.  Physician notified.  Lorri Frederick, LCSW 10/27/2019, 11:01 AM
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
## 2019-10-27 NOTE — Progress Notes (Addendum)
Client alert ambulates with walker. Has partial right foot amputation. Cooperative and calm primarily; irritable when in pain. Given PRN for mouth ulcers and head pain.Support and encouragement offered. Safety measures in place; 15 min check ongoing.  PRN mouthwash given 0600. Nocturnal enuresis episode.

## 2019-10-27 NOTE — BHH Group Notes (Signed)
LCSW Wellness Group Note   10/27/2019 1300  Type of Group and Topic: Psychoeducational Group:  Wellness  Participation Level:  Did not attend  Description of Group  Wellness group introduces the topic and its focus on developing healthy habits across the spectrum and its relationship to a decrease in hospital admissions.  Six areas of wellness are discussed: physical, social spiritual, intellectual, occupational, and emotional.  Patients are asked to consider their current wellness habits and to identify areas of wellness where they are interested and able to focus on improvements.    Therapeutic Goals 1. Patients will understand components of wellness and how they can positively impact overall health.  2. Patients will identify areas of wellness where they have developed good habits. 3. Patients will identify areas of wellness where they would like to make improvements.    Summary of Patient Progress     Therapeutic Modalities: Cognitive Behavioral Therapy Psychoeducation    Auria Mckinlay Jon, LCSW   

## 2019-10-28 DIAGNOSIS — F19951 Other psychoactive substance use, unspecified with psychoactive substance-induced psychotic disorder with hallucinations: Secondary | ICD-10-CM

## 2019-10-28 LAB — GLUCOSE, CAPILLARY
Glucose-Capillary: 85 mg/dL (ref 70–99)
Glucose-Capillary: 86 mg/dL (ref 70–99)
Glucose-Capillary: 157 mg/dL — ABNORMAL HIGH (ref 70–99)
Glucose-Capillary: 186 mg/dL — ABNORMAL HIGH (ref 70–99)

## 2019-10-28 MED ORDER — TAMSULOSIN HCL 0.4 MG PO CAPS: 0.4000 mg | ORAL_CAPSULE | Freq: Every day | ORAL | 1 refills | Status: DC

## 2019-10-28 MED ORDER — INSULIN DETEMIR 100 UNIT/ML ~~LOC~~ SOLN: 20.0000 [IU] | Freq: Two times a day (BID) | SUBCUTANEOUS | 1 refills | Status: DC

## 2019-10-28 MED ORDER — CYANOCOBALAMIN 500 MCG PO TABS: 500.0000 ug | ORAL_TABLET | Freq: Every day | ORAL | 1 refills | Status: AC

## 2019-10-28 MED ORDER — VITAMIN D3 25 MCG PO TABS: 1000.0000 [IU] | ORAL_TABLET | Freq: Every day | ORAL | 1 refills | Status: AC

## 2019-10-28 MED ORDER — POLYETHYLENE GLYCOL 3350 17 G PO PACK: 17.0000 g | PACK | Freq: Every day | ORAL | 1 refills | Status: DC

## 2019-10-28 MED ORDER — GABAPENTIN 300 MG PO CAPS: 300.0000 mg | ORAL_CAPSULE | Freq: Two times a day (BID) | ORAL | 1 refills | Status: DC

## 2019-10-28 MED ORDER — CLOPIDOGREL BISULFATE 75 MG PO TABS: 75.0000 mg | ORAL_TABLET | Freq: Every day | ORAL | 1 refills | Status: DC

## 2019-10-28 MED ORDER — ASPIRIN EC 81 MG PO TBEC: 81.0000 mg | DELAYED_RELEASE_TABLET | Freq: Every day | ORAL | 1 refills | Status: DC

## 2019-10-28 MED ORDER — AMLODIPINE BESYLATE 10 MG PO TABS: 10.0000 mg | ORAL_TABLET | Freq: Every day | ORAL | 1 refills | Status: DC

## 2019-10-28 MED ORDER — OLANZAPINE 5 MG PO TABS: 5.0000 mg | ORAL_TABLET | Freq: Every day | ORAL | 1 refills | Status: DC

## 2019-10-28 MED ORDER — PANTOPRAZOLE SODIUM 40 MG PO TBEC: 40.0000 mg | DELAYED_RELEASE_TABLET | Freq: Two times a day (BID) | ORAL | 1 refills | Status: DC

## 2019-10-28 MED ORDER — HYDROCHLOROTHIAZIDE 25 MG PO TABS: 25.0000 mg | ORAL_TABLET | Freq: Every day | ORAL | 1 refills | Status: DC

## 2019-10-28 NOTE — BHH Group Notes (Signed)
BHH Group Notes:  (Nursing/MHT/Case Management/Adjunct)  Date:  10/28/2019  Time:  10:24 AM  Type of Therapy:  Community Meeting  Participation Level:  Did Not Attend   Summary of Progress/Problems:  Kerrie Pleasure 10/28/2019, 10:24 AM

## 2019-10-28 NOTE — BHH Counselor (Signed)
CSW spoke with patient.  Patient again declined aftercare at this time.   Pt declined SPE contact at this time.   Penni Homans, MSW, LCSW 10/28/2019 3:14 PM

## 2019-10-28 NOTE — BHH Group Notes (Signed)
Emotional Regulation 10/28/2019 1PM  Type of Therapy/Topic:  Group Therapy:  Emotion Regulation  Participation Level:  Did Not Attend   Description of Group:   The purpose of this group is to assist patients in learning to regulate negative emotions and experience positive emotions. Patients will be guided to discuss ways in which they have been vulnerable to their negative emotions. These vulnerabilities will be juxtaposed with experiences of positive emotions or situations, and patients will be challenged to use positive emotions to combat negative ones. Special emphasis will be placed on coping with negative emotions in conflict situations, and patients will process healthy conflict resolution skills.  Therapeutic Goals: 1. Patient will identify two positive emotions or experiences to reflect on in order to balance out negative emotions 2. Patient will label two or more emotions that they find the most difficult to experience 3. Patient will demonstrate positive conflict resolution skills through discussion and/or role plays  Summary of Patient Progress:       Therapeutic Modalities:   Cognitive Behavioral Therapy Feelings Identification Dialectical Behavioral Therapy   Suzan Slick, LCSW 10/28/2019 2:35 PM

## 2019-10-28 NOTE — BHH Group Notes (Signed)
BHH Group Notes:  (Nursing/MHT/Case Management/Adjunct)  Date:  10/28/2019  Time:  11:46 AM  Type of Therapy:  Psychoeducational Skills  Participation Level:  Did Not Attend  Summary of Progress/Problems:  Kerrie Pleasure 10/28/2019, 11:46 AM

## 2019-10-28 NOTE — Progress Notes (Signed)
Patient ID: Samuel [name], male   DOB: 06-Dec-1960, 59 y.o.   MRN: [id number]   Discharge Note:  Patient denies SI/HI/AVH at this time. Discharge instructions, AVS, prescriptions, and transition record gone over with patient. Patient agrees to comply with medication management, follow-up visit, and outpatient therapy. Patient belongings returned to patient. Patient questions and concerns addressed and answered. Patient ambulatory off unit. Patient discharged to home with sister-in-law.

## 2019-10-28 NOTE — Progress Notes (Addendum)
Client alert ambulates with walker.Cooperative and calm primarily; irritable when in pain. Given PRN for mouth ulcers and flank pain. Accepted scheduled meds as ordered. Support and encouragement offered. Safety measures in place; 15 min check ongoing.    Resident up at 0400 requesting a BG check... very angry d/t fact he did not get a juice for p.m. snack and offered sugar free drink instead. Requested PRN pain meds for right flank pain. Writer offered a combination of pain meds instead of only narcotics...client upset saying "Im ready to go home! I already have my prescription and can take my pain meds whenever I want unless they change that too"..client argued for 20 mins after insisting his mouth was hurting while lying on his RIGHT side..  claimed "I hid a chocolate pudding last night" to Clinical research associate

## 2019-10-28 NOTE — Progress Notes (Signed)
[facility] MD Progress Note  10/28/2019 3:09 PM Samuel [name]  MRN:  [id number] Subjective: This is a follow-up note for this 59 year old man who was transferred to the psychiatric service after exhibiting hallucinations and confusion on the medical service.  Chart reviewed patient seen.  On interview today I found the patient rather irritable.  His speech was slightly pressured and his affect was more irritable than seemed appropriate for the situation.  He talked at some length about how upset he was to still be in the hospital.  He did not however appear to have any obvious psychotic symptoms.  He denied suicidal or homicidal ideation.  Patient has absolutely denied any past psychiatric history.  It is reported in the notes that he did admit to having had problems with amphetamines in the past.  No toxicology screen was ever done during this entire hospitalization however so we do not have any details about whether that could be an active problem.  Patient to me seems hypomanic but not psychotic and not obviously dangerous.  He insists that he has not had a return of any of the hallucinations that he had after anesthesia.  Patient seems to have a reasonably good understanding of treating his diabetes and high blood pressure.  He states that he intends to follow-up locally at the [place] [facility] [place] other [place] he would find a doctor out there.  I think poorly features not meet commitment criteria does not appear to require further hospitalization. 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 Diabetes mellitus without complication (HCC)   . Hypertension   . UTI (urinary tract infection)     Past Surgical History:  Procedure Laterality Date  . CORONARY ANGIOPLASTY WITH STENT PLACEMENT    . CYSTOSCOPY/URETEROSCOPY/HOLMIUM LASER/STENT PLACEMENT Right 10/20/2019   Procedure: CYSTOSCOPY/URETEROSCOPY/HOLMIUM LASER/STENT PLACEMENT;  Surgeon: [name], MD;  Location: [facility] ORS;  Service: Urology;  Laterality: Right;  . ESOPHAGOGASTRODUODENOSCOPY N/A 10/21/2019   Procedure: ESOPHAGOGASTRODUODENOSCOPY (EGD);  Surgeon: Toney Reil, MD;  Location: [facility] ENDOSCOPY;  Service: Gastroenterology;  Laterality: N/A;  . FOOT AMPUTATION    . HERNIA REPAIR    . INCISION AND DRAINAGE PERIRECTAL ABSCESS N/A 09/20/2019   Procedure: IRRIGATION AND DEBRIDEMENT PERIRECTAL ABSCESS;  Surgeon: Carolan Shiver, MD;  Location: [facility] ORS;  Service: General;  Laterality: N/A;   Family History: History reviewed. No pertinent family history. Family Psychiatric  History: Patient told me a story in the course of his rambling about how his grandfather shot himself.  Probably the most remarkable thing about this was how it had nothing to do with anything else we were discussing.  Also that he did not seem to see this as being an example of family history of mental illness Social History:  Social History   Substance and Sexual Activity  Alcohol Use Not Currently     Social History   Substance and Sexual Activity  Drug Use Not on file    Social History   Socioeconomic History  . Marital status: Single    Spouse name:  Not on file  . Number of children: Not on file  . Years of education: Not on file  . Highest education level: Not on file  Occupational History  . Not on file  Tobacco Use  . Smoking status: Former Games developermoker  . Smokeless tobacco: Never Used  Substance and Sexual Activity  . Alcohol  use: Not Currently  . Drug use: Not on file  . Sexual activity: Not on file  Other Topics Concern  . Not on file  Social History Narrative  . Not on file   Social Determinants of Health   Financial Resource Strain:   . Difficulty of Paying Living Expenses:   Food Insecurity:   . Worried About Programme researcher, broadcasting/film/videounning Out of Food in the Last Year:   . Baristaan Out of Food in the Last Year:   Transportation Needs:   . Freight forwarderLack of Transportation (Medical):   Marland Kitchen. Lack of Transportation (Non-Medical):   Physical Activity:   . Days of Exercise per Week:   . Minutes of Exercise per Session:   Stress:   . Feeling of Stress :   Social Connections:   . Frequency of Communication with Friends and Family:   . Frequency of Social Gatherings with Friends and Family:   . Attends Religious Services:   . Active Member of Clubs or Organizations:   . Attends BankerClub or Organization Meetings:   Marland Kitchen. Marital Status:    Additional Social History:                         Sleep: Fair  Appetite:  Fair  Current Medications: Current Facility-Administered Medications  Medication Dose Route Frequency Provider Last Rate Last Admin  . acetaminophen (TYLENOL) tablet 650 mg  650 mg Oral Q6H PRN Maryagnes AmosStarkes-Perry, Takia S, FNP   650 mg at 10/28/19 0431  . alum & mag hydroxide-simeth (MAALOX/MYLANTA) 200-200-20 MG/5ML suspension 30 mL  30 mL Oral Q4H PRN Starkes-Perry, Juel Burrowakia S, FNP      . amLODipine (NORVASC) tablet 10 mg  10 mg Oral Daily Maryagnes AmosStarkes-Perry, Takia S, FNP   10 mg at 10/28/19 0906  . cholecalciferol (VITAMIN D3) tablet 1,000 Units  1,000 Units Oral Daily Maryagnes AmosStarkes-Perry, Takia S, FNP   1,000 Units at 10/28/19 [id number]  . clopidogrel (PLAVIX) tablet 75 mg  75 mg Oral Daily Maryagnes AmosStarkes-Perry, Takia S, FNP   75 mg at 10/28/19 0906  . docusate sodium (COLACE) capsule 100 mg  100 mg Oral Daily Starkes-Perry, Takia S, FNP      . gabapentin (NEURONTIN) capsule 300 mg  300 mg Oral BID Maryagnes AmosStarkes-Perry, Takia S, FNP   300 mg at 10/28/19 0907   . hydrALAZINE (APRESOLINE) tablet 10 mg  10 mg Oral Q8H PRN Starkes-Perry, Takia S, FNP      . ibuprofen (ADVIL) tablet 400 mg  400 mg Oral TID WC Starkes-Perry, Juel Burrowakia S, FNP      . insulin aspart (novoLOG) injection 0-9 Units  0-9 Units Subcutaneous TID WC Maryagnes AmosStarkes-Perry, Takia S, FNP   3 Units at 10/27/19 1109  . insulin detemir (LEVEMIR) injection 20 Units  20 Units Subcutaneous BID Antonieta Pertlary, Greg Lawson, MD   20 Units at 10/28/19 0915  . magnesium hydroxide (MILK OF MAGNESIA) suspension 30 mL  30 mL Oral Daily PRN Starkes-Perry, Juel Burrowakia S, FNP      . multivitamin with minerals tablet 1 tablet  1 tablet Oral Daily Maryagnes AmosStarkes-Perry, Takia S, FNP   1 tablet at 10/28/19 0907  .  nicotine (NICODERM CQ - dosed in mg/24 hours) patch 21 mg  21 mg Transdermal Daily Maryagnes Amos, FNP   21 mg at 10/28/19 0908  . OLANZapine (ZYPREXA) tablet 5 mg  5 mg Oral QHS Maryagnes Amos, FNP   5 mg at 10/27/19 2105  . pantoprazole (PROTONIX) EC tablet 40 mg  40 mg Oral BID Maryagnes Amos, FNP   40 mg at 10/28/19 0908  . polyethylene glycol (MIRALAX / GLYCOLAX) packet 17 g  17 g Oral Daily Starkes-Perry, Juel Burrow, FNP      . protein supplement (ENSURE MAX) liquid  11 oz Oral BID BM Starkes-Perry, Juel Burrow, FNP      . tamsulosin (FLOMAX) capsule 0.4 mg  0.4 mg Oral Daily Maryagnes Amos, FNP   0.4 mg at 10/28/19 0906  . vitamin B-12 (CYANOCOBALAMIN) tablet 500 mcg  500 mcg Oral Daily Maryagnes Amos, FNP   500 mcg at 10/28/19 4268    Lab Results:  Results for orders placed or performed during the hospital encounter of 10/26/19 (from the past 48 hour(s))  Glucose, capillary     Status: Abnormal   Collection Time: 10/26/19  8:38 PM  Result Value Ref Range   Glucose-Capillary 186 (H) 70 - 99 mg/dL    Comment: Glucose reference range applies only to samples taken after fasting for at least 8 hours.  Glucose, capillary     Status: Abnormal   Collection Time: 10/27/19  7:27 AM  Result Value  Ref Range   Glucose-Capillary 120 (H) 70 - 99 mg/dL    Comment: Glucose reference range applies only to samples taken after fasting for at least 8 hours.  Glucose, capillary     Status: Abnormal   Collection Time: 10/27/19 11:08 AM  Result Value Ref Range   Glucose-Capillary 235 (H) 70 - 99 mg/dL    Comment: Glucose reference range applies only to samples taken after fasting for at least 8 hours.  Glucose, capillary     Status: Abnormal   Collection Time: 10/27/19  1:42 PM  Result Value Ref Range   Glucose-Capillary 159 (H) 70 - 99 mg/dL    Comment: Glucose reference range applies only to samples taken after fasting for at least 8 hours.   Comment 1 Notify RN   Glucose, capillary     Status: Abnormal   Collection Time: 10/27/19  4:13 PM  Result Value Ref Range   Glucose-Capillary 133 (H) 70 - 99 mg/dL    Comment: Glucose reference range applies only to samples taken after fasting for at least 8 hours.  Glucose, capillary     Status: Abnormal   Collection Time: 10/27/19  8:12 PM  Result Value Ref Range   Glucose-Capillary 221 (H) 70 - 99 mg/dL    Comment: Glucose reference range applies only to samples taken after fasting for at least 8 hours.   Comment 1 Notify RN   Glucose, capillary     Status: None   Collection Time: 10/28/19  4:35 AM  Result Value Ref Range   Glucose-Capillary 85 70 - 99 mg/dL    Comment: Glucose reference range applies only to samples taken after fasting for at least 8 hours.  Glucose, capillary     Status: None   Collection Time: 10/28/19  7:03 AM  Result Value Ref Range   Glucose-Capillary 86 70 - 99 mg/dL    Comment: Glucose reference range applies only to samples taken after fasting for at least 8 hours.  Comment 1 Notify RN   Glucose, capillary     Status: Abnormal   Collection Time: 10/28/19 11:15 AM  Result Value Ref Range   Glucose-Capillary 157 (H) 70 - 99 mg/dL    Comment: Glucose reference range applies only to samples taken after fasting for  at least 8 hours.   Comment 1 Notify RN     Blood Alcohol level:  No results found for: [facility]  Metabolic Disorder Labs: Lab Results  Component Value Date   HGBA1C >15.5 (H) 10/19/2019   MPG >398 10/19/2019   No results found for: PROLACTIN No results found for: CHOL, TRIG, HDL, CHOLHDL, VLDL, LDLCALC  Physical Findings: AIMS:  , ,  ,  ,    CIWA:    COWS:     Musculoskeletal: Strength & Muscle Tone: within normal limits Gait & Station: normal Patient leans: N/A  Psychiatric Specialty Exam: Physical Exam  Nursing note and vitals reviewed. Constitutional: He appears well-developed and well-nourished.  HENT:  Head: Normocephalic and atraumatic.  Eyes: Pupils are equal, round, and reactive to light. Conjunctivae are normal.  Cardiovascular: Regular rhythm and normal heart sounds.  Respiratory: Effort normal.  GI: Soft.  Musculoskeletal:        General: Normal range of motion.     Cervical back: Normal range of motion.  Neurological: He is alert.  Skin: Skin is warm and dry.  Psychiatric: His mood appears anxious. His speech is rapid and/or pressured. He is agitated. He is not aggressive. Thought content is not paranoid and not delusional. He expresses impulsivity. He expresses no homicidal and no suicidal ideation. He exhibits abnormal recent memory.    Review of Systems  Constitutional: Negative.   HENT: Negative.   Eyes: Negative.   Respiratory: Negative.   Cardiovascular: Negative.   Gastrointestinal: Negative.   Musculoskeletal: Negative.   Skin: Negative.   Neurological: Negative.   Psychiatric/Behavioral: Negative.     Blood pressure 116/70, pulse 95, temperature 98.6 F (37 C), temperature source Oral, resp. rate 18, height 5\' 9"  (1.753 m), weight 95.3 kg, SpO2 97 %.Body mass index is 31.01 kg/m.  General Appearance: Casual  Eye Contact:  Good  Speech:  Pressured  Volume:  Increased  Mood:  Irritable  Affect:  Congruent  Thought Process:  Coherent   Orientation:  Full (Time, Place, and Person)  Thought Content:  Logical  Suicidal Thoughts:  No  Homicidal Thoughts:  No  Memory:  Immediate;   Fair Recent;   Poor Remote;   Fair  Judgement:  Fair  Insight:  Fair  Psychomotor Activity:  Restlessness  Concentration:  Concentration: Fair  Recall:  Poor  Fund of Knowledge:  Fair  Language:  Fair  Akathisia:  No  Handed:  Right  AIMS (if indicated):     Assets:  Communication Skills Desire for Improvement Resilience  ADL's:  Intact  Cognition:  WNL  Sleep:  Number of Hours: 3.75     Treatment Plan Summary: Daily contact with patient to assess and evaluate symptoms and progress in treatment, Medication management and Plan 59 year old man who was transferred to the psychiatric service because of transient hallucinations after medical treatment.  In interview with me today he still seems to have some manicky-like symptoms but he is not psychotic and not obviously dangerous.  There is no clear indication for continued hospitalization.  Patient will be discharged with recommendation to avoid opiates and intoxicating drugs in the future and referral for further medical treatment.  Jovaughn Wojtaszek,  MD 10/28/2019, 3:09 PM

## 2019-10-28 NOTE — Progress Notes (Signed)
Recreation Therapy Notes  Date: 10/28/2019  Time: 9:30 am   Location: Craft room   Behavioral response: N/A   Intervention Topic: Self-esteem   Discussion/Intervention: Patient did not attend group.   Clinical Observations/Feedback:  Patient did not attend group.   Asani Mcburney LRT/CTRS        Sana Tessmer 10/28/2019 12:02 PM

## 2019-10-28 NOTE — Discharge Summary (Signed)
Physician Discharge Summary Note  Patient:  Samuel [name] is an 59 y.o., male MRN:  [id number] DOB:  02/12/1961 Patient phone:  [phone] (home)  Patient address:   [address] [place] [address],  Total Time spent with patient: 30 minutes  Date of Admission:  10/26/2019 Date of Discharge: October 28, 2019  Reason for Admission: Patient was admitted because of hallucinations after anesthesia  Principal Problem: Substance-induced psychotic disorder with hallucinations [facility]) Discharge Diagnoses: Principal Problem:   Substance-induced psychotic disorder with hallucinations ([place]) Active Problems:   Delirium, drug-induced   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 Diabetes mellitus without complication ([place])   . Hypertension   . UTI (urinary tract infection)     Past Surgical History:  Procedure Laterality Date  . CORONARY ANGIOPLASTY WITH STENT PLACEMENT    . CYSTOSCOPY/URETEROSCOPY/HOLMIUM LASER/STENT PLACEMENT Right 10/20/2019   Procedure: CYSTOSCOPY/URETEROSCOPY/HOLMIUM LASER/STENT PLACEMENT;  Surgeon: Billey Co, MD;  Location: [facility] ORS;  Service: Urology;  Laterality: Right;  . ESOPHAGOGASTRODUODENOSCOPY N/A 10/21/2019   Procedure: ESOPHAGOGASTRODUODENOSCOPY (EGD);  Surgeon: Lin Landsman, MD;  Location: [facility] ENDOSCOPY;  Service: Gastroenterology;  Laterality: N/A;  . FOOT AMPUTATION    . HERNIA REPAIR    . INCISION AND DRAINAGE PERIRECTAL ABSCESS N/A 09/20/2019   Procedure: IRRIGATION AND DEBRIDEMENT PERIRECTAL ABSCESS;  Surgeon: Herbert Pun, MD;  Location: [facility] ORS;  Service: General;  Laterality: N/A;   Family History: History reviewed. No pertinent family history. Family Psychiatric  History: Denies any although he does tell me a story about how his grandfather shot  himself Social History:  Social History   Substance and Sexual Activity  Alcohol Use Not Currently     Social History   Substance and Sexual Activity  Drug Use Not on file    Social History   Socioeconomic History  . Marital status: Single    Spouse name: Not on file  . Number of children: Not on file  . Years of education: Not on file  . Highest education level: Not on file  Occupational History  . Not on file  Tobacco Use  . Smoking status: Former Research scientist (life sciences)  . Smokeless tobacco: Never Used  Substance and Sexual Activity  . Alcohol use: Not Currently  . Drug use: Not on file  . Sexual activity: Not on file  Other Topics Concern  . Not on file  Social History Narrative  . Not on file   Social Determinants of Health   Financial Resource Strain:   . Difficulty of Paying Living Expenses:   Food Insecurity:   . Worried About Charity fundraiser in the Last Year:   . Arboriculturist in the Last Year:   Transportation Needs:   . Film/video editor (Medical):   Marland Kitchen Lack of Transportation (Non-Medical):   Physical Activity:   . Days of Exercise per Week:   . Minutes of Exercise per Session:   Stress:   . Feeling of Stress :   Social Connections:   . Frequency of Communication with Friends and Family:   . Frequency of Social Gatherings with Friends and Family:   . Attends Religious Services:   . Active Member of Clubs or Organizations:   . Attends Archivist Meetings:   Marland Kitchen Marital Status:     Hospital Course: Admitted to  psychiatric service.  Continued on medical medications and Zyprexa.  Patient has denied any hallucinations since coming on the unit.  He has not shown any dangerous or aggressive behavior.  He has been cooperative with treatment.  On interview today he denies any psychotic symptoms.  He is somewhat irritable and hyperverbal but does not appear to be psychotic nor to meet commitment criteria.  Patient educated about avoiding drugs of abuse and  keeping a regular sleep schedule.  He is aware of how to take care of his diabetes and high blood pressure.  Prescriptions will be provided and he will be discharged home with follow-up at local clinic.  Physical Findings: AIMS:  , ,  ,  ,    CIWA:    COWS:     Musculoskeletal: Strength & Muscle Tone: within normal limits Gait & Station: normal Patient leans: N/A  Psychiatric Specialty Exam: Physical Exam  Nursing note and vitals reviewed. Constitutional: He appears well-developed and well-nourished.  HENT:  Head: Normocephalic and atraumatic.  Eyes: Pupils are equal, round, and reactive to light. Conjunctivae are normal.  Cardiovascular: Regular rhythm and normal heart sounds.  Respiratory: Effort normal. No respiratory distress.  GI: Soft.  Musculoskeletal:        General: Normal range of motion.     Cervical back: Normal range of motion.  Neurological: He is alert.  Skin: Skin is warm and dry.  Psychiatric: His mood appears anxious. His speech is tangential. He is agitated. He is not aggressive. Thought content is not paranoid. He expresses impulsivity. He expresses no homicidal and no suicidal ideation. He exhibits abnormal recent memory.    Review of Systems  Constitutional: Negative.   HENT: Negative.   Eyes: Negative.   Respiratory: Negative.   Cardiovascular: Negative.   Gastrointestinal: Negative.   Musculoskeletal: Negative.   Skin: Negative.   Neurological: Negative.   Psychiatric/Behavioral: The patient is nervous/anxious.     Blood pressure 116/70, pulse 95, temperature 98.6 F (37 C), temperature source Oral, resp. rate 18, height 5\' 9"  (1.753 m), weight 95.3 kg, SpO2 97 %.Body mass index is 31.01 kg/m.  General Appearance: Casual  Eye Contact:  Good  Speech:  Clear and Coherent  Volume:  Increased  Mood:  Irritable  Affect:  Congruent  Thought Process:  Disorganized  Orientation:  Full (Time, Place, and Person)  Thought Content:  Logical and  Tangential  Suicidal Thoughts:  No  Homicidal Thoughts:  No  Memory:  Immediate;   Fair Recent;   Fair Remote;   Fair  Judgement:  Fair  Insight:  Fair  Psychomotor Activity:  Decreased  Concentration:  Concentration: Fair  Recall:  of Knowledge:  Fair  Language:  Fair  Akathisia:  No  Handed:  Right  AIMS (if indicated):     Assets:  Desire for Improvement  ADL's:  Intact  Cognition:  WNL  Sleep:  Number of Hours: 3.75     Have you used any form of tobacco in the last 30 days? (Cigarettes, Smokeless Tobacco, Cigars, and/or Pipes): No  Has this patient used any form of tobacco in the last 30 days? (Cigarettes, Smokeless Tobacco, Cigars, and/or Pipes) Yes, Yes, A prescription for an FDA-approved tobacco cessation medication was offered at discharge and the patient refused  Blood Alcohol level:  No results found for: [facility]  Metabolic Disorder Labs:  Lab Results  Component Value Date   HGBA1C >15.5 (H) 10/19/2019   MPG >398 10/19/2019  No results found for: PROLACTIN No results found for: CHOL, TRIG, HDL, CHOLHDL, VLDL, LDLCALC  See Psychiatric Specialty Exam and Suicide Risk Assessment completed by Attending Physician prior to discharge.  Discharge destination:  Home  Is patient on multiple antipsychotic therapies at discharge:  No   Has Patient had three or more failed trials of antipsychotic monotherapy by history:  No  Recommended Plan for Multiple Antipsychotic Therapies: NA  Discharge Instructions    Diet - low sodium heart healthy   Complete by: As directed    Increase activity slowly   Complete by: As directed      Allergies as of 10/28/2019   No Known Allergies     Medication List    STOP taking these medications   acetaminophen 325 MG tablet Commonly known as: TYLENOL   ARIPiprazole 5 MG tablet Commonly known as: ABILIFY   lidocaine 2 % solution Commonly known as: XYLOCAINE   nicotine 21 mg/24hr patch Commonly known as: NICODERM  CQ - dosed in mg/24 hours   nystatin 100000 UNIT/ML suspension Commonly known as: MYCOSTATIN   oxyCODONE-acetaminophen 5-325 MG tablet Commonly known as: PERCOCET/ROXICET     TAKE these medications     Indication  amLODipine 10 MG tablet Commonly known as: NORVASC Take 1 tablet (10 mg total) by mouth daily.  Indication: High Blood Pressure Disorder   aspirin EC 81 MG tablet Take 1 tablet (81 mg total) by mouth daily.  Indication: Stable Angina Pectoris   clopidogrel 75 MG tablet Commonly known as: PLAVIX Take 1 tablet (75 mg total) by mouth daily. Start taking on: October 29, 2019  Indication: Procedure to Reestablish Blood Supply to the Heart   gabapentin 300 MG capsule Commonly known as: NEURONTIN Take 1 capsule (300 mg total) by mouth 2 (two) times daily.  Indication: Neuropathic Pain   hydrochlorothiazide 25 MG tablet Commonly known as: HYDRODIURIL Take 1 tablet (25 mg total) by mouth daily.  Indication: High Blood Pressure Disorder   insulin detemir 100 UNIT/ML injection Commonly known as: LEVEMIR Inject 0.2 mLs (20 Units total) into the skin 2 (two) times daily. What changed: how much to take  Indication: Type 2 Diabetes   OLANZapine 5 MG tablet Commonly known as: ZYPREXA Take 1 tablet (5 mg total) by mouth at bedtime.  Indication: Psychosis   pantoprazole 40 MG tablet Commonly known as: PROTONIX Take 1 tablet (40 mg total) by mouth 2 (two) times daily.  Indication: Gastroesophageal Reflux Disease   polyethylene glycol 17 g packet Commonly known as: MIRALAX / GLYCOLAX Take 17 g by mouth daily. Start taking on: October 29, 2019 What changed:   when to take this  reasons to take this  Indication: Constipation   tamsulosin 0.4 MG Caps capsule Commonly known as: FLOMAX Take 1 capsule (0.4 mg total) by mouth daily.  Indication: Benign Enlargement of Prostate   vitamin B-12 500 MCG tablet Commonly known as: CYANOCOBALAMIN Take 1 tablet (500 mcg total)  by mouth daily.  Indication: Inadequate Vitamin B12   Vitamin D3 25 MCG tablet Commonly known as: Vitamin D Take 1 tablet (1,000 Units total) by mouth daily.  Indication: Vitamin D Deficiency      Follow-up Information    Patient declined. Follow up.   Why: Patient declined. Contact information: Patient declined.          Follow-up recommendations:  Activity:  Activity as tolerated Diet:  Diabetic diet Other:  Follow-up with outpatient medical care  Comments: Prescriptions provided at discharge  Signed: Mordecai Rasmussen, MD 10/28/2019, 3:23 PM

## 2019-10-28 NOTE — Progress Notes (Signed)
D- Patient alert and oriented. Patient presented in an angry/agitated mood on assessment stating that he didn't get his pain medication how he has been getting it at home. Patient feels as if staff is trying to trick him. Patient did not endorse any depression/anxiety to this writer, however, he was not talking about much other than his Percocet. Patient continues to deny SI, HI, AVH. Patient had no stated goals for today, however, he is pretty set on discharge.  A- Some scheduled medications administered to patient, per MD orders. Support and encouragement provided. Routine safety checks conducted every 15 minutes. Patient informed to notify staff with problems or concerns.  R- No adverse drug reactions noted. Patient contracts for safety at this time. Patient compliant with medications and treatment plan. Patient receptive, calm, and cooperative. Patient interacts well with others on the unit.  Patient remains safe at this time.

## 2019-10-28 NOTE — Progress Notes (Signed)
  [facility] Adult Case Management Discharge Plan :  Will you be returning to the same living situation after discharge:  Yes,  pt reports that he is returning home. At discharge, do you have transportation home?: Yes,  pt reports that he is returning home.  Do you have the ability to pay for your medications: No.  Release of information consent forms completed and in the chart;  Patient's signature needed at discharge.  Patient to Follow up at: Follow-up Information    Patient declined. Follow up.   Why: Patient declined. Contact information: Patient declined.          Next level of care provider has access to [facility] Link:no  Safety Planning and Suicide Prevention discussed: Yes,  SPE completed with the patient.   Have you used any form of tobacco in the last 30 days? (Cigarettes, Smokeless Tobacco, Cigars, and/or Pipes): No  Has patient been referred to the Quitline?: Patient refused referral  Patient has been referred for addiction treatment: Pt. refused referral  Harden Mo, LCSW 10/28/2019, 3:12 PM

## 2019-10-28 NOTE — [facility] Suicide Risk Assessment (Signed)
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   HENT: Negative.   Eyes: Negative.   Respiratory: Negative.   Cardiovascular: Negative.   Gastrointestinal: Negative.   Musculoskeletal: Negative.   Skin: Negative.   Neurological: Negative.   Psychiatric/Behavioral: Negative.     Blood pressure 116/70, pulse 95, temperature 98.6 F (37 C), temperature source Oral, resp. rate 18, height 5\' 9"  (1.753 m), weight 95.3 kg, SpO2 97 %.Body mass index is 31.01 kg/m.  General Appearance: Disheveled  Eye Contact::  Good  Speech:  Clear and Coherent409  Volume:  Increased  Mood:  Irritable  Affect:  Congruent  Thought Process:  Goal Directed  Orientation:  Full (Time, Place, and Person)  Thought Content:  Logical and Tangential  Suicidal Thoughts:  No  Homicidal Thoughts:  No  Memory:  Immediate;   Fair Recent;   Poor Remote;   Fair  Judgement:  Fair  Insight:  Fair  Psychomotor Activity:  Normal  Concentration:  Fair  Recall:  [phone] of Knowledge:Fair  Language: Fair  Akathisia:  No  Handed:  Right  AIMS (if indicated):     Assets:  Desire for Improvement Housing Resilience  Sleep:  Number of Hours: 3.75  Cognition: WNL  ADL's:  Intact   Mental Status Per Nursing Assessment::   On Admission:  NA  Demographic Factors:  Male, Caucasian and Low socioeconomic status  Loss Factors: Decline in physical  health  Historical Factors: Impulsivity  Risk Reduction Factors:   Living with another person, especially a relative  Continued Clinical Symptoms:  Medical Diagnoses and Treatments/Surgeries  Cognitive Features That Contribute To Risk:  None    Suicide Risk:  Minimal: No identifiable suicidal ideation.  Patients presenting with no risk factors but with morbid ruminations; may be classified as minimal risk based on the severity of the depressive symptoms  Follow-up Information    Patient declined. Follow up.   Why: Patient declined. Contact information: Patient declined.          Plan Of Care/Follow-up recommendations:  Activity:  Activity as tolerated Diet:  Diabetic diet Other:  Follow-up with outpatient medical care  [phone], MD 10/28/2019, 3:14 PM

## 2020-01-21 ENCOUNTER — Telehealth: Payer: Self-pay

## 2020-01-21 NOTE — Telephone Encounter (Signed)
Individual has been contacted 3+ times. No further attempts to contact individual will be made.

## 2020-06-10 ENCOUNTER — Ambulatory Visit: Payer: Self-pay | Admitting: Surgery

## 2020-07-20 IMAGING — CT CT RENAL STONE PROTOCOL
2 of 4 series · 15 of 46 positions shown, 17 images · non-contrast
Comparison: CT abdomen pelvis dated 10/01/2013.

CLINICAL DATA: 58-year-old male with flank pain. Concern for kidney
stone.

EXAM:
CT ABDOMEN AND PELVIS WITHOUT CONTRAST
TECHNIQUE: Multidetector CT imaging of the abdomen and pelvis was performed
following the standard protocol without IV contrast.

[Series 2: stone full standard · axial · 0.82mm/px · z∈[-543,-88]mm · 12 of 105 slices shown, 14 images]
[im 9/105  soft-tissue]
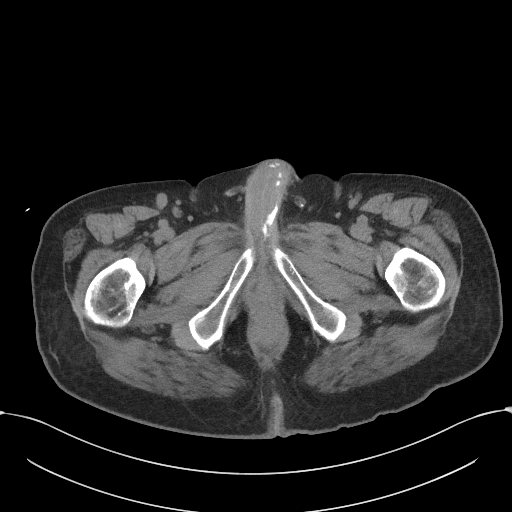
[im 9/105  bone]
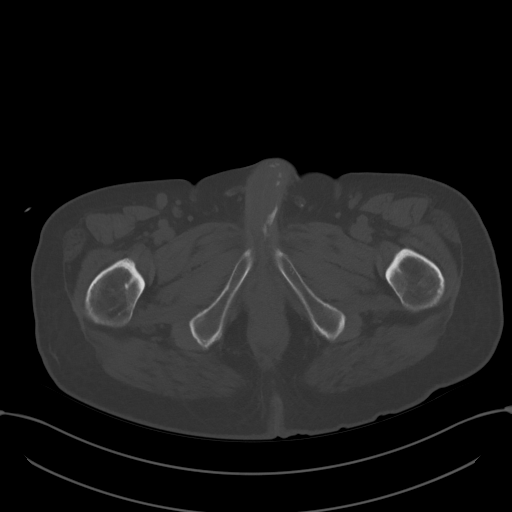
[im 17/105  soft-tissue]
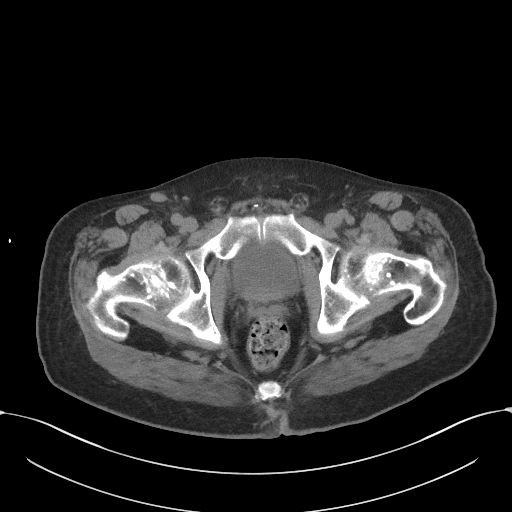
[im 25/105  soft-tissue]
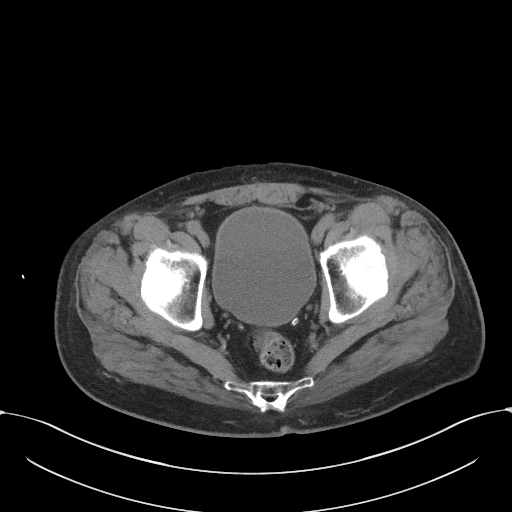
[im 34/105  soft-tissue]
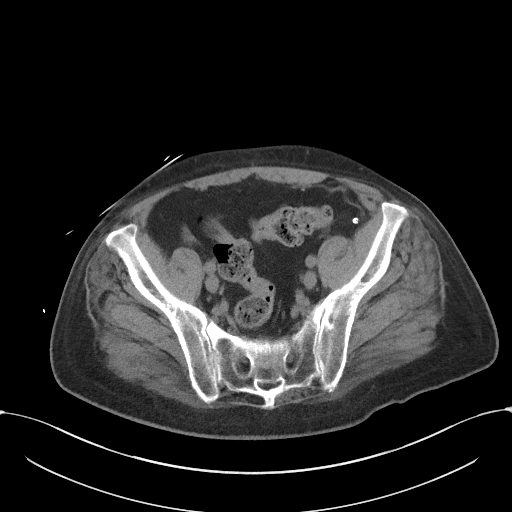
[im 42/105  soft-tissue]
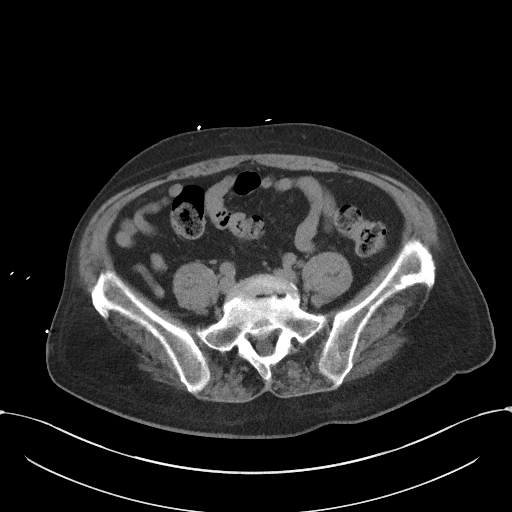
[im 50/105  soft-tissue]
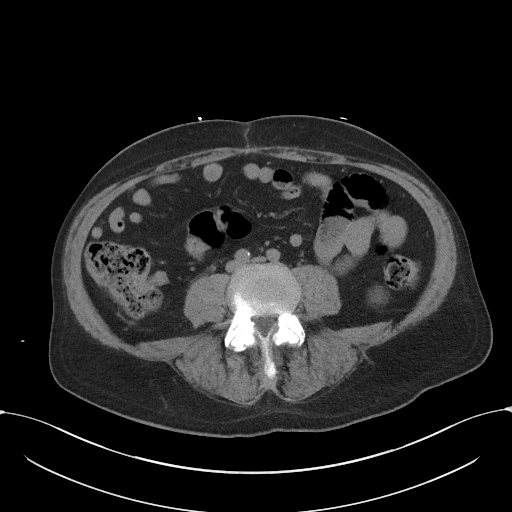
[im 59/105  soft-tissue]
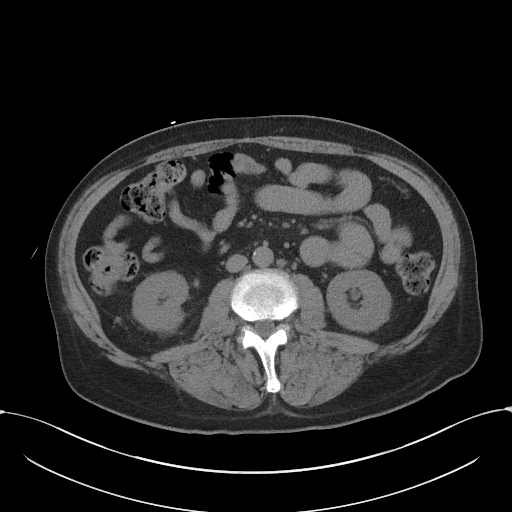
[im 67/105  soft-tissue]
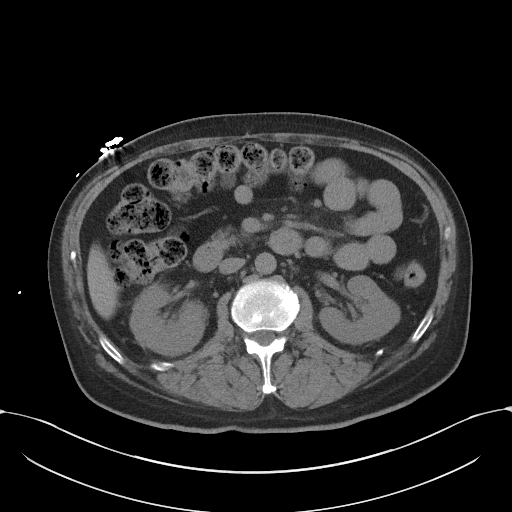
[im 75/105  soft-tissue]
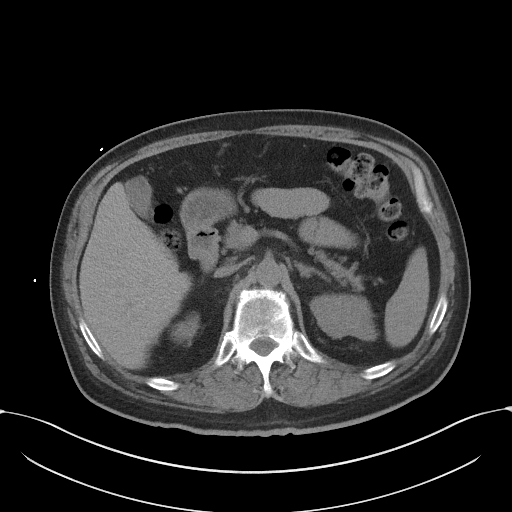
[im 75/105  bone]
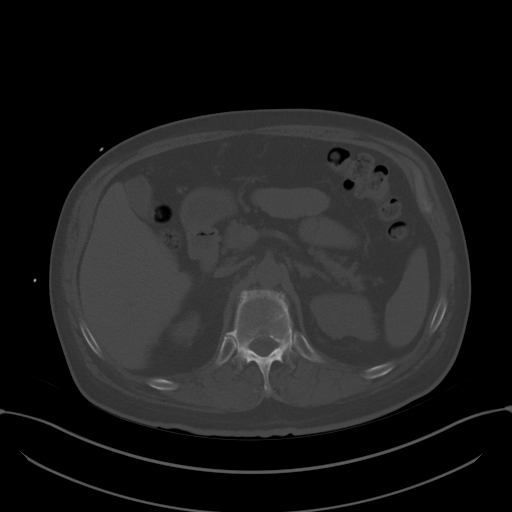
[im 84/105  soft-tissue]
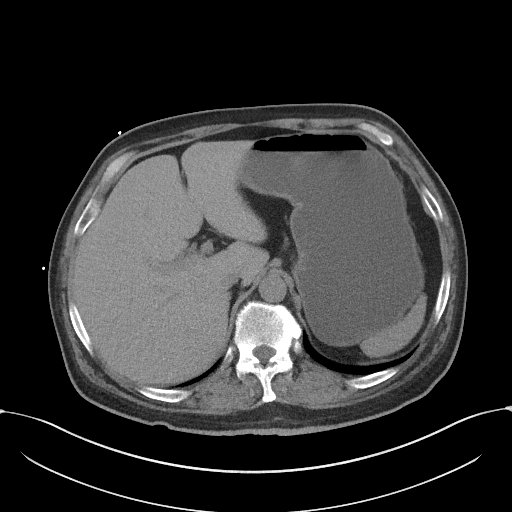
[im 92/105  soft-tissue]
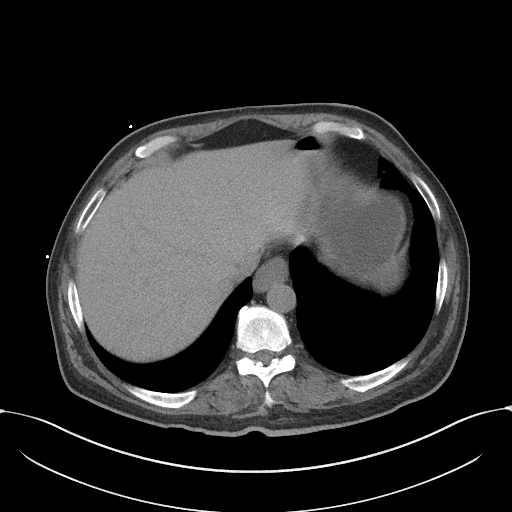
[im 100/105  soft-tissue]
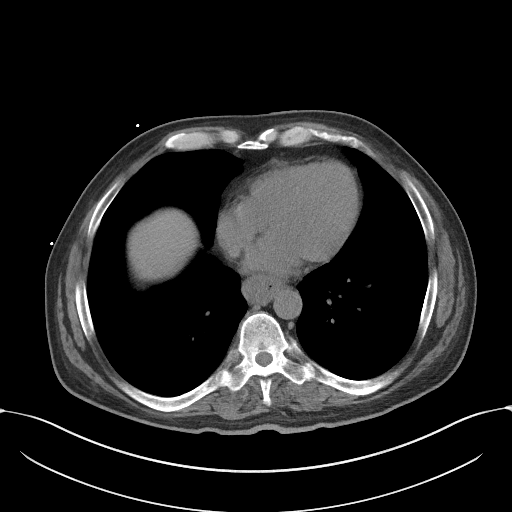

[Series 5: coronal · coronal · 0.78mm/px · 3 of 127 slices shown]
[im 43/127  soft-tissue]
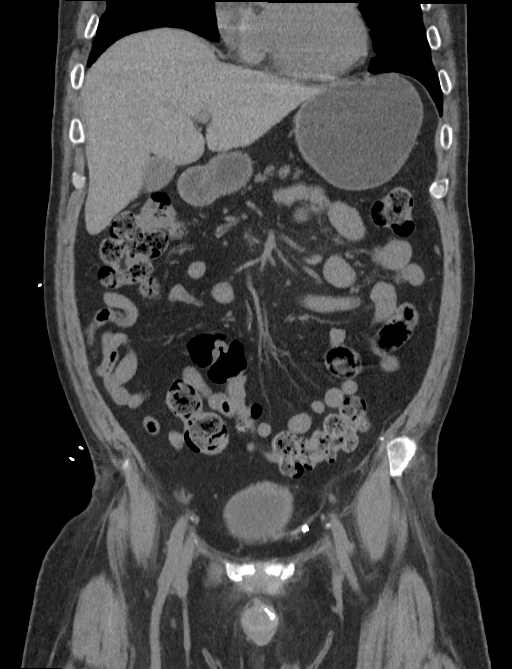
[im 57/127  soft-tissue]
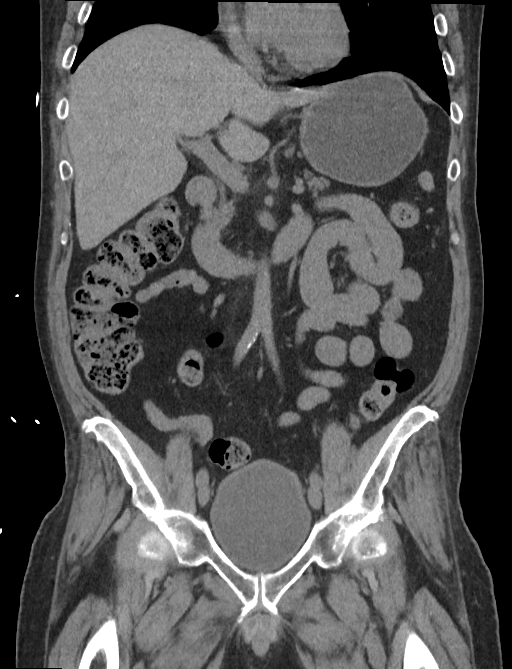
[im 71/127  soft-tissue]
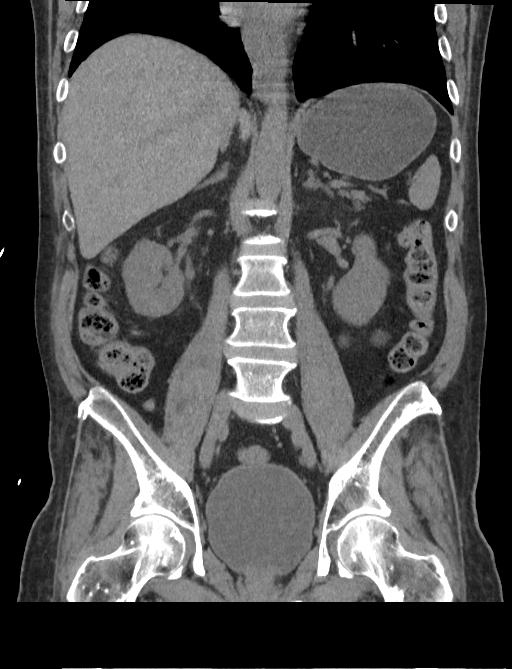

[15 of 46 positions shown; findings below may reference images not displayed]

FINDINGS: Evaluation of this exam is limited in the absence of intravenous
contrast.

Lower chest: The visualized lung bases are clear.

No intra-abdominal free air or free fluid.

Hepatobiliary: The liver is unremarkable. No intrahepatic biliary
ductal dilatation. The gallbladder is unremarkable.

Pancreas: Unremarkable. No pancreatic ductal dilatation or
surrounding inflammatory changes.

Spleen: Normal in size without focal abnormality.

Adrenals/Urinary Tract: The adrenal glands are unremarkable. There
is a 3 cm left renal inferior pole cyst. There is a 5 mm stone in
the distal right ureter. There is minimal right hydronephrosis. No
stone identified within the right kidney. There is no hydronephrosis
or nephrolithiasis on the left. The left ureter and urinary bladder
appear unremarkable.

Stomach/Bowel: There is minimal circumferential thickening of the
distal esophagus which may be partly related to underdistention or
represent mild esophagitis. Clinical correlation is recommended.
There is moderate stool throughout the colon. There is no bowel
obstruction or active inflammation. Normal appendix.

Vascular/Lymphatic: Minimal aortoiliac atherosclerotic disease. The
IVC is unremarkable. No portal venous gas. There is no adenopathy.

Reproductive: The prostate and seminal vesicles are grossly
unremarkable. No pelvic mass. Scattered calcification of the
peripheral corpus cavernosum of the penis noted, progressed since
the prior CT.

Other: None

Musculoskeletal: Osteopenia with degenerative changes of the spine.
No acute osseous pathology.
IMPRESSION: 1. A 5 mm distal right ureteral stone with minimal right
hydronephrosis.
2. No bowel obstruction. Normal appendix.
3. Mild circumferential thickening of the distal esophagus may be
related to underdistention or represent mild esophagitis. Clinical
correlation is recommended.
4. Aortic Atherosclerosis (PERPA-RO6.6).

## 2021-03-03 ENCOUNTER — Emergency Department
Admission: EM | Admit: 2021-03-03 | Discharge: 2021-03-03 | Disposition: A | Discharge status: Home / Self Care | Payer: Medicare PPO | Attending: Student in an Organized Health Care Education/Training Program | Admitting: Student in an Organized Health Care Education/Training Program

## 2021-03-03 ENCOUNTER — Emergency Department: Payer: Medicare PPO

## 2021-03-03 ENCOUNTER — Other Ambulatory Visit: Payer: Self-pay

## 2021-03-03 DIAGNOSIS — E1165 Type 2 diabetes mellitus with hyperglycemia: Secondary | ICD-10-CM | POA: Diagnosis not present

## 2021-03-03 DIAGNOSIS — Z7982 Long term (current) use of aspirin: Secondary | ICD-10-CM | POA: Insufficient documentation

## 2021-03-03 DIAGNOSIS — I10 Essential (primary) hypertension: Secondary | ICD-10-CM | POA: Insufficient documentation

## 2021-03-03 DIAGNOSIS — L97511 Non-pressure chronic ulcer of other part of right foot limited to breakdown of skin: Secondary | ICD-10-CM | POA: Diagnosis not present

## 2021-03-03 DIAGNOSIS — R739 Hyperglycemia, unspecified: Secondary | ICD-10-CM

## 2021-03-03 DIAGNOSIS — Z87891 Personal history of nicotine dependence: Secondary | ICD-10-CM | POA: Insufficient documentation

## 2021-03-03 DIAGNOSIS — E1143 Type 2 diabetes mellitus with diabetic autonomic (poly)neuropathy: Secondary | ICD-10-CM | POA: Insufficient documentation

## 2021-03-03 DIAGNOSIS — Z794 Long term (current) use of insulin: Secondary | ICD-10-CM | POA: Diagnosis not present

## 2021-03-03 DIAGNOSIS — Z79899 Other long term (current) drug therapy: Secondary | ICD-10-CM | POA: Insufficient documentation

## 2021-03-03 LAB — URINALYSIS, COMPLETE (UACMP) WITH MICROSCOPIC
Bilirubin Urine: NEGATIVE
Glucose, UA: >=500 mg/dL — AB
Hgb urine dipstick: NEGATIVE
Ketones, ur: NEGATIVE mg/dL
Leukocytes,Ua: NEGATIVE
Nitrite: NEGATIVE
Protein, ur: 100 mg/dL — AB
Specific Gravity, Urine: 1.027 (ref 1.005–1.030)
pH: 5 (ref 5.0–8.0)

## 2021-03-03 LAB — CBC
HCT: 39.2 % (ref 39.0–52.0)
Hemoglobin: 13.6 g/dL (ref 13.0–17.0)
MCH: 32.9 pg (ref 26.0–34.0)
MCHC: 34.7 g/dL (ref 30.0–36.0)
MCV: 94.9 fL (ref 80.0–100.0)
Platelets: 263 10*3/uL (ref 150–400)
RBC: 4.13 MIL/uL — ABNORMAL LOW (ref 4.22–5.81)
RDW: 12.9 % (ref 11.5–15.5)
WBC: 8.3 10*3/uL (ref 4.0–10.5)
nRBC: 0 % (ref 0.0–0.2)

## 2021-03-03 LAB — BASIC METABOLIC PANEL
Anion gap: 10 (ref 5–15)
BUN: 20 mg/dL (ref 6–20)
CO2: 28 mmol/L (ref 22–32)
Calcium: 8.5 mg/dL — ABNORMAL LOW (ref 8.9–10.3)
Chloride: 94 mmol/L — ABNORMAL LOW (ref 98–111)
Creatinine, Ser: 1.44 mg/dL — ABNORMAL HIGH (ref 0.61–1.24)
GFR, Estimated: 57 mL/min — ABNORMAL LOW (ref >60)
Glucose, Bld: 504 mg/dL (ref 70–99)
Potassium: 3.8 mmol/L (ref 3.5–5.1)
Sodium: 132 mmol/L — ABNORMAL LOW (ref 135–145)

## 2021-03-03 LAB — CBG MONITORING, ED
Glucose-Capillary: 503 mg/dL (ref 70–99)
Glucose-Capillary: 262 mg/dL — ABNORMAL HIGH (ref 70–99)

## 2021-03-03 MED ORDER — AMOXICILLIN-POT CLAVULANATE 875-125 MG PO TABS
1.0000 | ORAL_TABLET | Freq: Once | ORAL | Status: AC
  Administered 2021-03-03: 1 via ORAL
  Filled 2021-03-03: qty 1

## 2021-03-03 MED ORDER — OXYCODONE-ACETAMINOPHEN 10-325 MG PO TABS: 1.0000 | ORAL_TABLET | Freq: Four times a day (QID) | ORAL | 0 refills | Status: AC | PRN

## 2021-03-03 MED ORDER — OXYCODONE-ACETAMINOPHEN 5-325 MG PO TABS
2.0000 | ORAL_TABLET | Freq: Once | ORAL | Status: AC
  Administered 2021-03-03: 2 via ORAL
  Filled 2021-03-03: qty 2

## 2021-03-03 MED ORDER — INSULIN ASPART 100 UNIT/ML IJ SOLN
10.0000 [IU] | Freq: Once | INTRAMUSCULAR | Status: AC
  Administered 2021-03-03: 10 [IU] via SUBCUTANEOUS
  Filled 2021-03-03: qty 1

## 2021-03-03 MED ORDER — AMOXICILLIN-POT CLAVULANATE 875-125 MG PO TABS: 1.0000 | ORAL_TABLET | Freq: Two times a day (BID) | ORAL | 0 refills | Status: AC

## 2021-03-03 MED ORDER — SODIUM CHLORIDE 0.9 % IV BOLUS
1000.0000 mL | Freq: Once | INTRAVENOUS | Status: AC
  Administered 2021-03-03: 1000 mL via INTRAVENOUS

## 2021-03-03 NOTE — ED Triage Notes (Signed)
Pt states that he has a new ulcer to his right foot for the past week, pt states that due to the wound he hasn't been able to get his sugars under control, states that after eating breakfast this am he gave himself 20 units of lantus and his blood sugar was still over 500. Pt states he is afraid to drop his blood sugar too fast because that makes him feel like he's dying

## 2021-03-03 NOTE — ED Provider Notes (Signed)
[facility] Emergency Department Provider Note    Event Date/Time   First MD Initiated Contact with Patient 03/03/21 1739     (approximate)  I have reviewed the triage vital signs and the nursing notes.   HISTORY  Chief Complaint Hyperglycemia    HPI Samuel [name] is a 60 y.o. male history of diabetes as well as PAD and foot wounds presents to the ER for evaluation of new developing right foot wound as well as concern over elevated blood sugar.  Just recently moved back from [place] with his brothers.  States it wound on his foot is developed over the past few days.  Not having fevers or chills has noted some swelling and pain.  Has not been on any recent antibiotics.  States his blood sugars been elevated despite taking his home Lantus.  No nausea or vomiting.  Denies any chest pain or pressure.  Past Medical History:  Diagnosis Date   Diabetes mellitus without complication (HCC)    Hypertension    UTI (urinary tract infection)    No family history on file. Past Surgical History:  Procedure Laterality Date   CORONARY ANGIOPLASTY WITH STENT PLACEMENT     CYSTOSCOPY/URETEROSCOPY/HOLMIUM LASER/STENT PLACEMENT Right 10/20/2019   Procedure: CYSTOSCOPY/URETEROSCOPY/HOLMIUM LASER/STENT PLACEMENT;  Surgeon: [name], MD;  Location: [facility] ORS;  Service: Urology;  Laterality: Right;   ESOPHAGOGASTRODUODENOSCOPY N/A 10/21/2019   Procedure: ESOPHAGOGASTRODUODENOSCOPY (EGD);  Surgeon: Toney Reil, MD;  [facility]: 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 Authorizing Provider  amoxicillin-clavulanate (AUGMENTIN) 875-125 MG tablet Take 1 tablet by mouth 2 (two) times daily for 7 days. 03/03/21 03/10/21 Yes Willy Eddy, MD  oxyCODONE-acetaminophen (PERCOCET) 10-325 MG tablet Take 1 tablet by mouth every 6 (six) hours as needed for pain. 03/03/21 03/03/22 Yes Willy Eddy, MD  amLODipine (NORVASC) 10 MG tablet Take 1 tablet (10 mg total) by mouth daily. 10/28/19   Clapacs, Jackquline Denmark, MD  aspirin EC 81 MG tablet Take 1 tablet (81 mg total) by mouth daily. 10/28/19   Clapacs, Jackquline Denmark, MD  clopidogrel (PLAVIX) 75 MG tablet Take 1 tablet (75 mg total) by mouth daily. 10/29/19   Clapacs, Jackquline Denmark, MD  gabapentin (NEURONTIN) 300 MG capsule Take 1 capsule (300 mg total) by mouth 2 (two) times daily. 10/28/19 11/27/19  Clapacs, Jackquline Denmark, MD  hydrochlorothiazide (HYDRODIURIL) 25 MG tablet Take 1 tablet (25 mg total) by mouth daily. 10/28/19   Clapacs, Jackquline Denmark, MD  insulin detemir (LEVEMIR) 100 UNIT/ML injection Inject 0.2 mLs (20 Units total) into the skin 2 (two) times daily. 10/28/19   Clapacs, Jackquline Denmark, MD  OLANZapine (ZYPREXA) 5 MG tablet Take 1 tablet (5 mg total) by mouth at bedtime. 10/28/19   Clapacs, Jackquline Denmark, MD  pantoprazole (PROTONIX) 40 MG tablet Take 1 tablet (40 mg total) by mouth 2 (two) times daily. 10/28/19 11/27/19  Clapacs, Jackquline Denmark, MD  polyethylene glycol (MIRALAX / GLYCOLAX) 17 g packet Take 17 g by mouth daily. 10/29/19   Clapacs, Jackquline Denmark, MD  tamsulosin (FLOMAX) 0.4 MG CAPS capsule Take 1 capsule (0.4 mg total) by mouth daily. 10/28/19   Clapacs, Jackquline Denmark, MD    Allergies Patient has no known allergies.    Social History Social  History   Tobacco Use   Smoking status: Former   Smokeless tobacco: Never  Substance Use Topics   Alcohol use: Not Currently    Review of Systems Patient denies headaches, rhinorrhea, blurry vision, numbness, shortness of breath, chest pain, edema, cough, abdominal pain, nausea, vomiting, diarrhea, dysuria, fevers, rashes or hallucinations unless otherwise stated above in HPI. ____________________________________________   PHYSICAL EXAM:  VITAL SIGNS: Vitals:   03/03/21 1559 03/03/21 1851  BP: (!) 175/92 (!) 200/109  Pulse: (!) 105 (!) 102  Resp: 16 17  Temp: 97.9 F (36.6 C) 97.6 F (36.4 C)  SpO2: 97% 99%    Constitutional: Alert and oriented.  Eyes: Conjunctivae are normal.  Head: Atraumatic. Nose: No congestion/rhinnorhea. Mouth/Throat: Mucous membranes are moist.   Neck: No stridor. Painless ROM.  Cardiovascular: Normal rate, regular rhythm. Grossly normal heart sounds.  Good peripheral circulation. Respiratory: Normal respiratory effort.  No retractions. Lungs CTAB. Gastrointestinal: Soft and nontender. No distention. No abdominal bruits. No CVA tenderness. 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 No gross focal neurologic deficits are appreciated. No facial droop Skin:  Skin is warm, dry and intact. No rash noted. Psychiatric: Mood and affect are normal. Speech and behavior are normal.  ____________________________________________   LABS (all labs ordered are listed, but only abnormal results are displayed)  Results for orders placed or performed during the hospital encounter of 03/03/21 (from the past 24 hour(s))  CBG monitoring, ED     Status: Abnormal   Collection Time: 03/03/21  3:59 PM  Result Value Ref Range   Glucose-Capillary  503 (HH) 70 - 99 mg/dL   Comment 1 Notify RN   Basic metabolic panel     Status: Abnormal   Collection Time: 03/03/21  4:04 PM  Result Value Ref Range   Sodium 132 (L) 135 - 145 mmol/L   Potassium 3.8 3.5 - 5.1 mmol/L   Chloride 94 (L) 98 - 111 mmol/L   CO2 28 22 - 32 mmol/L   Glucose, Bld 504 (HH) 70 - 99 mg/dL   BUN 20 6 - 20 mg/dL   Creatinine, Ser 0.17 (H) 0.61 - 1.24 mg/dL   Calcium 8.5 (L) 8.9 - 10.3 mg/dL   GFR, Estimated 57 (L) >60 mL/min   Anion gap 10 5 - 15  CBC     Status: Abnormal   Collection Time: 03/03/21  4:04 PM  Result Value Ref Range   WBC 8.3 4.0 - 10.5 K/uL   RBC 4.13 (L) 4.22 - 5.81 MIL/uL   Hemoglobin 13.6 13.0 - 17.0 g/dL   HCT 51.0 25.8 - 52.7 %   MCV 94.9 80.0 - 100.0 fL   MCH 32.9 26.0 - 34.0 pg   MCHC 34.7 30.0 - 36.0 g/dL   RDW 78.2 42.3 - 53.6 %  Platelets 263 150 - 400 K/uL   nRBC 0.0 0.0 - 0.2 %  Urinalysis, Complete w Microscopic     Status: Abnormal   Collection Time: 03/03/21  4:11 PM  Result Value Ref Range   Color, Urine YELLOW (A) YELLOW   APPearance CLEAR (A) CLEAR   Specific Gravity, Urine 1.027 1.005 - 1.030   pH 5.0 5.0 - 8.0   Glucose, UA >=500 (A) NEGATIVE mg/dL   Hgb urine dipstick NEGATIVE NEGATIVE   Bilirubin Urine NEGATIVE NEGATIVE   Ketones, ur NEGATIVE NEGATIVE mg/dL   Protein, ur 353 (A) NEGATIVE mg/dL   Nitrite NEGATIVE NEGATIVE   Leukocytes,Ua NEGATIVE NEGATIVE   RBC / HPF 0-5 0 - 5 RBC/hpf   WBC, UA 0-5 0 - 5 WBC/hpf   Bacteria, UA RARE (A) NONE SEEN   Squamous Epithelial / LPF 0-5 0 - 5   Mucus PRESENT    ____________________________________________ ____________________________________________  RADIOLOGY  I personally reviewed all radiographic images ordered to evaluate for the above acute complaints and reviewed radiology reports and findings.  These findings were personally discussed with the patient.  Please see medical record for radiology  report.  ____________________________________________   PROCEDURES  Procedure(s) performed:  Procedures    Critical Care performed: no ____________________________________________   INITIAL IMPRESSION / ASSESSMENT AND PLAN / ED COURSE  Pertinent labs & imaging results that were available during my care of the patient were reviewed by me and considered in my medical decision making (see chart for details).   DDX: Diabetic foot wound, cellulitis, abscess, ischemic limb, DKA, HHS, electrolyte abnormality  Samuel [name] is a 60 y.o. who presents to the ED with presentation as described above.  Patient nontoxic-appearing no acute distress.  Exam as above.  Has small area of ulceration with small amount of erythema he is diabetic glucose in the 500s.  Normal mental status no metabolic acidosis or findings suggest DKA or HHS.  Given IV fluids as well as subcu insulin.  He does have access to his insulin at home.  No sign of osteo-.  Will give Augmentin as well as referral to wound care clinic.  Patient does appear appropriate for trial of outpatient management.  States he is also prescribed Percocet for chronic pain.  Review of PMP is negative.  Will give short two day prescription bridge while establishing care here locally.  We discussed signs and symptoms for which patient should return to the ER.     The patient was evaluated in Emergency Department today for the symptoms described in the history of present illness. He/she was evaluated in the context of the global COVID-19 pandemic, which necessitated consideration that the patient might be at risk for infection with the SARS-CoV-2 virus that causes COVID-19. Institutional protocols and algorithms that pertain to the evaluation of patients at risk for COVID-19 are in a state of rapid change based on information released by regulatory bodies including the CDC and federal and state organizations. These policies and algorithms were followed  during the patient's care in the ED.  As part of my medical decision making, I reviewed the following data within the electronic MEDICAL RECORD NUMBER Nursing notes reviewed and incorporated, Labs reviewed, notes from prior ED visits and [place] Controlled Substance Database   ____________________________________________   FINAL CLINICAL IMPRESSION(S) / ED DIAGNOSES  Final diagnoses:  Hyperglycemia  Ulcer of right foot, limited to breakdown of skin (HCC)      NEW MEDICATIONS STARTED DURING THIS VISIT:  New Prescriptions  AMOXICILLIN-CLAVULANATE (AUGMENTIN) 875-125 MG TABLET    Take 1 tablet by mouth 2 (two) times daily for 7 days.   OXYCODONE-ACETAMINOPHEN (PERCOCET) 10-325 MG TABLET    Take 1 tablet by mouth every 6 (six) hours as needed for pain.     Note:  This document was prepared using Dragon voice recognition software and may include unintentional dictation errors.    Willy Eddyobinson, Wane Mollett, MD 03/03/21 [phone]

## 2021-03-11 ENCOUNTER — Ambulatory Visit: Payer: Medicare PPO | Admitting: Physician Assistant

## 2021-03-12 ENCOUNTER — Emergency Department: Payer: Medicare PPO

## 2021-03-12 ENCOUNTER — Other Ambulatory Visit: Payer: Self-pay

## 2021-03-12 ENCOUNTER — Emergency Department
Admission: EM | Admit: 2021-03-12 | Discharge: 2021-03-12 | Disposition: A | Discharge status: Home / Self Care | Payer: Medicare PPO | Attending: Emergency Medicine | Admitting: Emergency Medicine

## 2021-03-12 DIAGNOSIS — E1165 Type 2 diabetes mellitus with hyperglycemia: Secondary | ICD-10-CM | POA: Diagnosis not present

## 2021-03-12 DIAGNOSIS — G8929 Other chronic pain: Secondary | ICD-10-CM | POA: Insufficient documentation

## 2021-03-12 DIAGNOSIS — Z7982 Long term (current) use of aspirin: Secondary | ICD-10-CM | POA: Diagnosis not present

## 2021-03-12 DIAGNOSIS — L98499 Non-pressure chronic ulcer of skin of other sites with unspecified severity: Secondary | ICD-10-CM | POA: Diagnosis not present

## 2021-03-12 DIAGNOSIS — R739 Hyperglycemia, unspecified: Secondary | ICD-10-CM

## 2021-03-12 DIAGNOSIS — Z79899 Other long term (current) drug therapy: Secondary | ICD-10-CM | POA: Insufficient documentation

## 2021-03-12 DIAGNOSIS — M25561 Pain in right knee: Secondary | ICD-10-CM | POA: Diagnosis not present

## 2021-03-12 DIAGNOSIS — I10 Essential (primary) hypertension: Secondary | ICD-10-CM | POA: Diagnosis not present

## 2021-03-12 DIAGNOSIS — R55 Syncope and collapse: Secondary | ICD-10-CM | POA: Diagnosis not present

## 2021-03-12 DIAGNOSIS — Z87891 Personal history of nicotine dependence: Secondary | ICD-10-CM | POA: Insufficient documentation

## 2021-03-12 DIAGNOSIS — Z794 Long term (current) use of insulin: Secondary | ICD-10-CM | POA: Diagnosis not present

## 2021-03-12 LAB — BASIC METABOLIC PANEL
Anion gap: 8 (ref 5–15)
BUN: 14 mg/dL (ref 6–20)
CO2: 31 mmol/L (ref 22–32)
Calcium: 8.8 mg/dL — ABNORMAL LOW (ref 8.9–10.3)
Chloride: 96 mmol/L — ABNORMAL LOW (ref 98–111)
Creatinine, Ser: 1.22 mg/dL (ref 0.61–1.24)
GFR, Estimated: >60 mL/min (ref >60)
Glucose, Bld: 355 mg/dL — ABNORMAL HIGH (ref 70–99)
Potassium: 3.9 mmol/L (ref 3.5–5.1)
Sodium: 135 mmol/L (ref 135–145)

## 2021-03-12 LAB — MAGNESIUM: Magnesium: 2.2 mg/dL (ref 1.7–2.4)

## 2021-03-12 LAB — CBC
HCT: 43.9 % (ref 39.0–52.0)
Hemoglobin: 15.1 g/dL (ref 13.0–17.0)
MCH: 32.5 pg (ref 26.0–34.0)
MCHC: 34.4 g/dL (ref 30.0–36.0)
MCV: 94.4 fL (ref 80.0–100.0)
Platelets: 331 10*3/uL (ref 150–400)
RBC: 4.65 MIL/uL (ref 4.22–5.81)
RDW: 13.1 % (ref 11.5–15.5)
WBC: 7.7 10*3/uL (ref 4.0–10.5)
nRBC: 0 % (ref 0.0–0.2)

## 2021-03-12 LAB — CBG MONITORING, ED
Glucose-Capillary: 384 mg/dL — ABNORMAL HIGH (ref 70–99)
Glucose-Capillary: 276 mg/dL — ABNORMAL HIGH (ref 70–99)

## 2021-03-12 MED ORDER — OXYCODONE-ACETAMINOPHEN 5-325 MG PO TABS
2.0000 | ORAL_TABLET | Freq: Once | ORAL | Status: AC
  Administered 2021-03-12: 2 via ORAL
  Filled 2021-03-12: qty 2

## 2021-03-12 MED ORDER — INSULIN ASPART 100 UNIT/ML IJ SOLN
7.0000 [IU] | Freq: Once | INTRAMUSCULAR | Status: AC
  Administered 2021-03-12: 7 [IU] via INTRAVENOUS
  Filled 2021-03-12: qty 1

## 2021-03-12 MED ORDER — SODIUM CHLORIDE 0.9 % IV BOLUS
1000.0000 mL | Freq: Once | INTRAVENOUS | Status: AC
  Administered 2021-03-12: 1000 mL via INTRAVENOUS

## 2021-03-12 NOTE — ED Provider Notes (Signed)
[facility] Emergency Department Provider Note   ____________________________________________   Event Date/Time   First MD Initiated Contact with Patient 03/12/21 1026     (approximate)  I have reviewed the triage vital signs and the nursing notes.   HISTORY  Chief Complaint Loss of Consciousness and Knee Pain    HPI Samuel [name] is a 60 y.o. male with past medical history of hypertension, diabetes, and CAD who presents to the ED complaining of syncope.  Patient states that he had been feeling dizzy and lightheaded over the course of the night last night.  He then woke up this morning and attempted to go to the bathroom, but did not use his left leg prosthesis.  He felt lightheaded while standing to pee, eventually lost consciousness and fell to the ground.  He believes he hit his head and is now complaining of pain to the back of his head.  He denies any neck pain, does complain of pain in his right knee.  He states he deals with chronic pain in his knee and is typically prescribed 10 mg Percocet, but has been away from his primary care doctor in [place] for multiple weeks while living with his brother here in [place].  He denies any chest pain or shortness of breath with the syncopal episode.  He does state that his blood sugars have been running high at home despite being compliant with his insulin regimen.        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)    Essential hypertension    Psychosis (HCC) 10/23/2019   Ureterolithiasis 10/19/2019   Hyperglycemic crisis in diabetes mellitus (HCC) 10/18/2019    Past Surgical History:   Procedure Laterality Date   CORONARY ANGIOPLASTY WITH STENT PLACEMENT     CYSTOSCOPY/URETEROSCOPY/HOLMIUM LASER/STENT PLACEMENT Right 10/20/2019   Procedure: CYSTOSCOPY/URETEROSCOPY/HOLMIUM LASER/STENT PLACEMENT;  Surgeon: [name], MD;  Location: [facility] ORS;  Service: Urology;  Laterality: Right;   ESOPHAGOGASTRODUODENOSCOPY N/A 10/21/2019   Procedure: ESOPHAGOGASTRODUODENOSCOPY (EGD);  Surgeon: Toney Reil, MD;  Location: [facility] ENDOSCOPY;  Service: Gastroenterology;  Laterality: N/A;   FOOT AMPUTATION     HERNIA REPAIR     INCISION AND DRAINAGE PERIRECTAL ABSCESS N/A 09/20/2019   Procedure: IRRIGATION AND DEBRIDEMENT PERIRECTAL ABSCESS;  Surgeon: Carolan Shiver, MD;  Location: [facility] ORS;  Service: General;  Laterality: N/A;    Prior to Admission medications   Medication Sig Start Date End Date Taking? Authorizing Provider  amLODipine (NORVASC) 10 MG tablet Take 1 tablet (10 mg total) by mouth daily. 10/28/19   Clapacs, Jackquline Denmark, MD  aspirin EC 81 MG tablet Take 1 tablet (81 mg total) by mouth daily. 10/28/19   Clapacs, Jackquline Denmark, MD  clopidogrel (PLAVIX) 75 MG tablet Take 1 tablet (75 mg total) by mouth daily. 10/29/19   Clapacs, Jackquline Denmark, MD  gabapentin (NEURONTIN) 300 MG capsule Take 1 capsule (300 mg total) by mouth 2 (two) times daily. 10/28/19 11/27/19  Clapacs, Jackquline Denmark, MD  hydrochlorothiazide (HYDRODIURIL) 25 MG tablet Take 1 tablet (25 mg total) by mouth daily. 10/28/19   Clapacs, Jackquline Denmark, MD  insulin detemir (LEVEMIR) 100 UNIT/ML injection  Inject 0.2 mLs (20 Units total) into the skin 2 (two) times daily. 10/28/19   Clapacs, Jackquline Denmark, MD  OLANZapine (ZYPREXA) 5 MG tablet Take 1 tablet (5 mg total) by mouth at bedtime. 10/28/19   Clapacs, Jackquline Denmark, MD  oxyCODONE-acetaminophen (PERCOCET) 10-325 MG tablet Take 1 tablet by mouth every 6 (six) hours as needed for pain. 03/03/21 03/03/22  Willy Eddy, MD  pantoprazole (PROTONIX) 40 MG tablet Take 1 tablet (40 mg total) by mouth 2 (two)  times daily. 10/28/19 11/27/19  Clapacs, Jackquline Denmark, MD  polyethylene glycol (MIRALAX / GLYCOLAX) 17 g packet Take 17 g by mouth daily. 10/29/19   Clapacs, Jackquline Denmark, MD  tamsulosin (FLOMAX) 0.4 MG CAPS capsule Take 1 capsule (0.4 mg total) by mouth daily. 10/28/19   Clapacs, Jackquline Denmark, MD    Allergies Patient has no known allergies.  History reviewed. No pertinent family history.  Social History Social History   Tobacco Use   Smoking status: Former   Smokeless tobacco: Never  Substance Use Topics   Alcohol use: Not Currently    Review of Systems  Constitutional: No fever/chills Eyes: No visual changes. ENT: No sore throat. Cardiovascular: Denies chest pain.  Positive for lightheadedness and syncope. Respiratory: Denies shortness of breath. Gastrointestinal: No abdominal pain.  No nausea, no vomiting.  No diarrhea.  No constipation. Genitourinary: Negative for dysuria. Musculoskeletal: Negative for back pain.  Positive for knee pain. Skin: Negative for rash. Neurological: Positive for headaches, negative for focal weakness or numbness.  ____________________________________________   PHYSICAL EXAM:  VITAL SIGNS: ED Triage Vitals  Enc Vitals Group     BP 03/12/21 0835 134/78     Pulse Rate 03/12/21 0835 97     Resp 03/12/21 0835 20     Temp 03/12/21 0835 97.7 F (36.5 C)     Temp Source 03/12/21 0835 Oral     SpO2 03/12/21 0835 100 %     Weight 03/12/21 0835 138 lb (62.6 kg)     Height 03/12/21 0835 5\' 10"  (1.778 m)     Head Circumference --      Peak Flow --      Pain Score 03/12/21 0849 0     Pain Loc --      Pain Edu? --      Excl. in GC? --     Constitutional: Alert and oriented. Eyes: Conjunctivae are normal. Head: Atraumatic. Nose: No congestion/rhinnorhea. Mouth/Throat: Mucous membranes are moist. Neck: Normal ROM Cardiovascular: Normal rate, regular rhythm. Grossly normal heart sounds. Respiratory: Normal respiratory effort.  No retractions. Lungs  CTAB. Gastrointestinal: Soft and nontender. No distention. Genitourinary: deferred Musculoskeletal: No lower extremity tenderness nor edema.  Status post left BKA, no left lower extremity tenderness to palpation.  Status post right TMA with diffuse tenderness to palpation of right knee.  Small, approximately 1 cm, area of ulceration to right TMA site with no erythema, warmth, or drainage noted. Neurologic:  Normal speech and language. No gross focal neurologic deficits are appreciated. Skin:  Skin is warm, dry and intact. No rash noted. Psychiatric: Mood and affect are normal. Speech and behavior are normal.  ____________________________________________   LABS (all labs ordered are listed, but only abnormal results are displayed)  Labs Reviewed  BASIC METABOLIC PANEL - Abnormal; Notable for the following components:      Result Value   Chloride 96 (*)    Glucose, Bld 355 (*)    Calcium 8.8 (*)    All other components  within normal limits  CBG MONITORING, ED - Abnormal; Notable for the following components:   Glucose-Capillary 384 (*)    All other components within normal limits  CBG MONITORING, ED - Abnormal; Notable for the following components:   Glucose-Capillary 276 (*)    All other components within normal limits  CBC  MAGNESIUM  URINALYSIS, COMPLETE (UACMP) WITH MICROSCOPIC   ____________________________________________  EKG  ED ECG REPORT I, Chesley Noon, the attending physician, personally viewed and interpreted this ECG.   Date: 03/12/2021  EKG Time: 8:46  Rate: 98  Rhythm: normal sinus rhythm  Axis: Normal  Intervals: Borderline prolonged QT  ST&T Change: None   PROCEDURES  Procedure(s) performed (including Critical Care):  Procedures   ____________________________________________   INITIAL IMPRESSION / ASSESSMENT AND PLAN / ED COURSE      60 year old male with past medical history of hypertension, diabetes, and CAD who presents to the ED  complaining of syncopal episode today without associated chest pain or shortness of breath.  EKG shows no evidence of arrhythmia or ischemia, does show borderline prolonged QT, which is similar to previous.  Syncopal episode likely due to dehydration given his ongoing hyperglycemia, low suspicion for cardiac etiology of syncope.  We will observe on cardiac monitor, hydrate with IV fluids and give dose of IV insulin.  Labs show no evidence of DKA or other electrolyte abnormality, we will add on magnesium level.  He complains of acute on chronic pain to right knee, also reports hitting his head.  CT head is negative for acute process, x-rays of right knee reviewed by me and show no fracture or dislocation.  Area of ulceration to his right foot does not appear acutely infected, he recently completed a course of antibiotics for this and would benefit from wound care follow-up, we will provide dressing here in the ED.  Repeat blood glucose is improving following IV fluids and insulin, no events noted on cardiac monitor.  Patient is appropriate for discharge home, states he will be relocating to [place] soon but he was also provided with referral to PCP and cardiology here in [place] if he were to remain in the area.  He states he has plenty of insulin available at home and he was counseled to follow-up with PCP for his chronic pain medication.  He was counseled to return to the ED for new worsening symptoms, patient agrees with plan.      ____________________________________________   FINAL CLINICAL IMPRESSION(S) / ED DIAGNOSES  Final diagnoses:  Syncope, unspecified syncope type  Hyperglycemia  Chronic pain of right knee  Skin ulcer, unspecified ulcer stage [facility])     ED Discharge Orders     None        Note:  This document was prepared using Dragon voice recognition software and may include unintentional dictation errors.    Chesley Noon, MD 03/12/21 1440

## 2021-03-12 NOTE — ED Triage Notes (Addendum)
Pt arrives via pov from home. Reports a syncopal episode while standing to urinate this morning. Pt a&o x 4 at this time. Denies any visual changes. Pt states he did not have prosthetic on at time of LOC. Pt c/o pain in rt knee and left arm. Pt reports hitting head of head. Skin tear noted on left arm. Pt states he has been having trouble controlling CBG levels. NAD noted at this time

## 2021-04-05 ENCOUNTER — Emergency Department: Payer: Medicare PPO

## 2021-04-05 ENCOUNTER — Emergency Department
Admission: EM | Admit: 2021-04-05 | Discharge: 2021-04-05 | Disposition: A | Discharge status: Home / Self Care | Payer: Medicare PPO | Attending: Emergency Medicine | Admitting: Emergency Medicine

## 2021-04-05 ENCOUNTER — Other Ambulatory Visit: Payer: Self-pay

## 2021-04-05 DIAGNOSIS — Z79899 Other long term (current) drug therapy: Secondary | ICD-10-CM | POA: Diagnosis not present

## 2021-04-05 DIAGNOSIS — Z760 Encounter for issue of repeat prescription: Secondary | ICD-10-CM | POA: Diagnosis not present

## 2021-04-05 DIAGNOSIS — S40862A Insect bite (nonvenomous) of left upper arm, initial encounter: Secondary | ICD-10-CM | POA: Insufficient documentation

## 2021-04-05 DIAGNOSIS — G8929 Other chronic pain: Secondary | ICD-10-CM

## 2021-04-05 DIAGNOSIS — I10 Essential (primary) hypertension: Secondary | ICD-10-CM | POA: Insufficient documentation

## 2021-04-05 DIAGNOSIS — E1165 Type 2 diabetes mellitus with hyperglycemia: Secondary | ICD-10-CM | POA: Diagnosis not present

## 2021-04-05 DIAGNOSIS — M47816 Spondylosis without myelopathy or radiculopathy, lumbar region: Secondary | ICD-10-CM | POA: Insufficient documentation

## 2021-04-05 DIAGNOSIS — B888 Other specified infestations: Secondary | ICD-10-CM

## 2021-04-05 DIAGNOSIS — S20369A Insect bite (nonvenomous) of unspecified front wall of thorax, initial encounter: Secondary | ICD-10-CM | POA: Diagnosis not present

## 2021-04-05 DIAGNOSIS — W57XXXA Bitten or stung by nonvenomous insect and other nonvenomous arthropods, initial encounter: Secondary | ICD-10-CM | POA: Insufficient documentation

## 2021-04-05 DIAGNOSIS — Z794 Long term (current) use of insulin: Secondary | ICD-10-CM | POA: Diagnosis not present

## 2021-04-05 DIAGNOSIS — Z87891 Personal history of nicotine dependence: Secondary | ICD-10-CM | POA: Insufficient documentation

## 2021-04-05 DIAGNOSIS — S80862A Insect bite (nonvenomous), left lower leg, initial encounter: Secondary | ICD-10-CM | POA: Diagnosis not present

## 2021-04-05 DIAGNOSIS — S80861A Insect bite (nonvenomous), right lower leg, initial encounter: Secondary | ICD-10-CM | POA: Insufficient documentation

## 2021-04-05 DIAGNOSIS — Z7982 Long term (current) use of aspirin: Secondary | ICD-10-CM | POA: Insufficient documentation

## 2021-04-05 DIAGNOSIS — S20469A Insect bite (nonvenomous) of unspecified back wall of thorax, initial encounter: Secondary | ICD-10-CM | POA: Insufficient documentation

## 2021-04-05 DIAGNOSIS — R112 Nausea with vomiting, unspecified: Secondary | ICD-10-CM

## 2021-04-05 DIAGNOSIS — M549 Dorsalgia, unspecified: Secondary | ICD-10-CM | POA: Diagnosis present

## 2021-04-05 DIAGNOSIS — Z7902 Long term (current) use of antithrombotics/antiplatelets: Secondary | ICD-10-CM | POA: Diagnosis not present

## 2021-04-05 DIAGNOSIS — R739 Hyperglycemia, unspecified: Secondary | ICD-10-CM

## 2021-04-05 DIAGNOSIS — M4316 Spondylolisthesis, lumbar region: Secondary | ICD-10-CM | POA: Insufficient documentation

## 2021-04-05 DIAGNOSIS — R197 Diarrhea, unspecified: Secondary | ICD-10-CM | POA: Diagnosis not present

## 2021-04-05 DIAGNOSIS — M545 Low back pain, unspecified: Secondary | ICD-10-CM

## 2021-04-05 DIAGNOSIS — S30861A Insect bite (nonvenomous) of abdominal wall, initial encounter: Secondary | ICD-10-CM | POA: Diagnosis not present

## 2021-04-05 DIAGNOSIS — I2510 Atherosclerotic heart disease of native coronary artery without angina pectoris: Secondary | ICD-10-CM | POA: Insufficient documentation

## 2021-04-05 LAB — URINALYSIS, COMPLETE (UACMP) WITH MICROSCOPIC
Bacteria, UA: NONE SEEN
Bilirubin Urine: NEGATIVE
Glucose, UA: >=500 mg/dL — AB
Hgb urine dipstick: NEGATIVE
Ketones, ur: NEGATIVE mg/dL
Leukocytes,Ua: NEGATIVE
Nitrite: NEGATIVE
Protein, ur: >=300 mg/dL — AB
Specific Gravity, Urine: 1.03 (ref 1.005–1.030)
pH: 5 (ref 5.0–8.0)

## 2021-04-05 LAB — HEPATIC FUNCTION PANEL
ALT: 14 U/L (ref 0–44)
AST: 19 U/L (ref 15–41)
Albumin: 3.5 g/dL (ref 3.5–5.0)
Alkaline Phosphatase: 102 U/L (ref 38–126)
Bilirubin, Direct: <0.1 mg/dL (ref 0.0–0.2)
Total Bilirubin: 0.6 mg/dL (ref 0.3–1.2)
Total Protein: 6.6 g/dL (ref 6.5–8.1)

## 2021-04-05 LAB — BASIC METABOLIC PANEL
Anion gap: 9 (ref 5–15)
BUN: 19 mg/dL (ref 6–20)
CO2: 25 mmol/L (ref 22–32)
Calcium: 8.7 mg/dL — ABNORMAL LOW (ref 8.9–10.3)
Chloride: 96 mmol/L — ABNORMAL LOW (ref 98–111)
Creatinine, Ser: 1.12 mg/dL (ref 0.61–1.24)
GFR, Estimated: >60 mL/min (ref >60)
Glucose, Bld: 527 mg/dL (ref 70–99)
Potassium: 4.1 mmol/L (ref 3.5–5.1)
Sodium: 130 mmol/L — ABNORMAL LOW (ref 135–145)

## 2021-04-05 LAB — CBC
HCT: 42.3 % (ref 39.0–52.0)
Hemoglobin: 14.8 g/dL (ref 13.0–17.0)
MCH: 33 pg (ref 26.0–34.0)
MCHC: 35 g/dL (ref 30.0–36.0)
MCV: 94.2 fL (ref 80.0–100.0)
Platelets: 304 10*3/uL (ref 150–400)
RBC: 4.49 MIL/uL (ref 4.22–5.81)
RDW: 12.5 % (ref 11.5–15.5)
WBC: 10.6 10*3/uL — ABNORMAL HIGH (ref 4.0–10.5)
nRBC: 0 % (ref 0.0–0.2)

## 2021-04-05 LAB — CBG MONITORING, ED: Glucose-Capillary: 370 mg/dL — ABNORMAL HIGH (ref 70–99)

## 2021-04-05 MED ORDER — ACETAMINOPHEN 325 MG PO TABS
650.0000 mg | ORAL_TABLET | Freq: Once | ORAL | Status: AC
  Administered 2021-04-05: 650 mg via ORAL
  Filled 2021-04-05: qty 2

## 2021-04-05 MED ORDER — LACTATED RINGERS IV BOLUS
1000.0000 mL | Freq: Once | INTRAVENOUS | Status: AC
  Administered 2021-04-05: 1000 mL via INTRAVENOUS

## 2021-04-05 MED ORDER — TAMSULOSIN HCL 0.4 MG PO CAPS: 0.4000 mg | ORAL_CAPSULE | Freq: Every day | ORAL | 0 refills | Status: AC

## 2021-04-05 MED ORDER — INSULIN ASPART 100 UNIT/ML IJ SOLN
0.0000 [IU] | INTRAMUSCULAR | Status: DC
  Administered 2021-04-05: 15 [IU] via SUBCUTANEOUS
  Filled 2021-04-05: qty 1

## 2021-04-05 MED ORDER — OXYCODONE HCL 5 MG PO TABS
5.0000 mg | ORAL_TABLET | Freq: Once | ORAL | Status: AC
  Administered 2021-04-05: 5 mg via ORAL
  Filled 2021-04-05: qty 1

## 2021-04-05 MED ORDER — GABAPENTIN 300 MG PO CAPS: 300.0000 mg | ORAL_CAPSULE | Freq: Two times a day (BID) | ORAL | 0 refills | Status: DC

## 2021-04-05 MED ORDER — OLANZAPINE 5 MG PO TABS: 5.0000 mg | ORAL_TABLET | Freq: Every day | ORAL | 1 refills | Status: AC

## 2021-04-05 MED ORDER — AMLODIPINE BESYLATE 10 MG PO TABS: 10.0000 mg | ORAL_TABLET | Freq: Every day | ORAL | 1 refills | Status: AC

## 2021-04-05 MED ORDER — OXYCODONE-ACETAMINOPHEN 5-325 MG PO TABS
1.0000 | ORAL_TABLET | Freq: Once | ORAL | Status: AC
Start: 2021-04-05 — End: 2021-04-05
  Administered 2021-04-05: 1 via ORAL
  Filled 2021-04-05: qty 1

## 2021-04-05 MED ORDER — ONDANSETRON HCL 4 MG/2ML IJ SOLN
4.0000 mg | Freq: Once | INTRAMUSCULAR | Status: AC
  Administered 2021-04-05: 4 mg via INTRAVENOUS
  Filled 2021-04-05: qty 2

## 2021-04-05 MED ORDER — OXYCODONE HCL 5 MG PO TABS
5.0000 mg | ORAL_TABLET | ORAL | Status: AC
  Administered 2021-04-05: 5 mg via ORAL
  Filled 2021-04-05: qty 1

## 2021-04-05 MED ORDER — TAMSULOSIN HCL 0.4 MG PO CAPS
0.4000 mg | ORAL_CAPSULE | Freq: Every day | ORAL | Status: DC
  Administered 2021-04-05: 0.4 mg via ORAL
  Filled 2021-04-05: qty 1

## 2021-04-05 MED ORDER — AMLODIPINE BESYLATE 5 MG PO TABS
10.0000 mg | ORAL_TABLET | Freq: Every day | ORAL | Status: DC
  Administered 2021-04-05: 10 mg via ORAL
  Filled 2021-04-05: qty 2

## 2021-04-05 MED ORDER — HYDROCHLOROTHIAZIDE 25 MG PO TABS
25.0000 mg | ORAL_TABLET | Freq: Every day | ORAL | Status: DC
  Administered 2021-04-05: 25 mg via ORAL
  Filled 2021-04-05: qty 1

## 2021-04-05 MED ORDER — POLYETHYLENE GLYCOL 3350 17 G PO PACK: 17.0000 g | PACK | Freq: Every day | ORAL | 1 refills | Status: AC

## 2021-04-05 MED ORDER — CLOPIDOGREL BISULFATE 75 MG PO TABS: 75.0000 mg | ORAL_TABLET | Freq: Every day | ORAL | 1 refills | Status: AC

## 2021-04-05 MED ORDER — OXYCODONE-ACETAMINOPHEN 10-325 MG PO TABS
1.0000 | ORAL_TABLET | ORAL | Status: DC
Start: 2021-04-05 — End: 2021-04-05

## 2021-04-05 MED ORDER — HYDROCHLOROTHIAZIDE 25 MG PO TABS: 25.0000 mg | ORAL_TABLET | Freq: Every day | ORAL | 1 refills | Status: DC

## 2021-04-05 MED ORDER — PANTOPRAZOLE SODIUM 40 MG PO TBEC: 40.0000 mg | DELAYED_RELEASE_TABLET | Freq: Two times a day (BID) | ORAL | 0 refills | Status: AC

## 2021-04-05 MED ORDER — ASPIRIN EC 81 MG PO TBEC: 81.0000 mg | DELAYED_RELEASE_TABLET | Freq: Every day | ORAL | 1 refills | Status: DC

## 2021-04-05 MED ORDER — GABAPENTIN 600 MG PO TABS
300.0000 mg | ORAL_TABLET | ORAL | Status: AC
  Administered 2021-04-05: 300 mg via ORAL
  Filled 2021-04-05: qty 1

## 2021-04-05 NOTE — ED Notes (Signed)
Unsuccessful IV attempt in right antecubital x1, gauze bandage applied. Dr Michiel Sites aware, IV team consult ordered.

## 2021-04-05 NOTE — ED Notes (Signed)
IV attempted unsuccessfully. Will notify secondary RN to attempt.

## 2021-04-05 NOTE — ED Provider Notes (Signed)
[facility] Department Provider Note  ____________________________________________   Event Date/Time   First MD Initiated Contact with Patient 04/05/21 1415     (approximate)  I have reviewed the triage vital signs and the nursing notes.   HISTORY  Chief Complaint Back Pain   HPI Samuel [name] is a 60 y.o. male with a past medical history of HTN, DM, CAD and chronic pain followed by pain management clinic who presents with several concerns.  Patient states that he has run out of his pain medicine and has not taken his insulin in several days.  States that he is on 10 mg Percocet tablets per day but that he typically runs out a week before the end of the month before he can get a refill.  States he thinks he is having some mild withdrawal as he has had some vomiting diarrhea and that this happens at the end of every month that his prior doctors have not going to increase his medicines.  He has not had any new cough, shortness of breath or chest pain and states his pain in his back is particularly severe.  He states he thinks he may have fallen recently but not exactly sure the date and is not sure if his pain is any worse after the fall or not.  He denies any incontinence or any headaches or other clear associated sick symptoms today.  He does state that he has lots of small red spots over his arms legs chest back from bedbugs which are throughout his apartment.         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)    Essential hypertension    Psychosis (HCC) 10/23/2019   Ureterolithiasis 10/19/2019   Hyperglycemic crisis in diabetes  mellitus (HCC) 10/18/2019    Past Surgical History:  Procedure Laterality Date   CORONARY ANGIOPLASTY WITH STENT PLACEMENT     CYSTOSCOPY/URETEROSCOPY/HOLMIUM LASER/STENT PLACEMENT Right 10/20/2019   Procedure: CYSTOSCOPY/URETEROSCOPY/HOLMIUM LASER/STENT PLACEMENT;  Surgeon: [name], MD;  Location: [facility] ORS;  Service: Urology;  Laterality: Right;   ESOPHAGOGASTRODUODENOSCOPY N/A 10/21/2019   Procedure: ESOPHAGOGASTRODUODENOSCOPY (EGD);  Surgeon: Toney Reil, MD;  Location: [facility] ENDOSCOPY;  Service: Gastroenterology;  Laterality: N/A;   FOOT AMPUTATION     HERNIA REPAIR     INCISION AND DRAINAGE PERIRECTAL ABSCESS N/A 09/20/2019   Procedure: IRRIGATION AND DEBRIDEMENT PERIRECTAL ABSCESS;  Surgeon: Carolan Shiver, MD;  Location: [facility] ORS;  Service: General;  Laterality: N/A;    Prior to Admission medications   Medication Sig Start Date End Date Taking? Authorizing Provider  amLODipine (NORVASC) 10 MG tablet Take 1 tablet (10 mg total) by mouth daily. 04/05/21   Gilles Chiquito, MD  aspirin EC 81 MG tablet Take 1 tablet (81 mg total) by mouth daily. 04/05/21   Gilles Chiquito, MD  clopidogrel (PLAVIX) 75 MG tablet Take 1 tablet (75 mg total) by mouth daily. 04/05/21   Gilles Chiquito, MD  gabapentin (NEURONTIN) 300 MG capsule Take 1 capsule (300 mg total) by mouth 2 (two) times daily for 7 days. 04/05/21 04/12/21  Gilles Chiquito, MD  hydrochlorothiazide (HYDRODIURIL) 25 MG tablet  Take 1 tablet (25 mg total) by mouth daily. 04/05/21   Gilles Chiquito, MD  insulin detemir (LEVEMIR) 100 UNIT/ML injection Inject 0.2 mLs (20 Units total) into the skin 2 (two) times daily. 10/28/19   Clapacs, Jackquline Denmark, MD  OLANZapine (ZYPREXA) 5 MG tablet Take 1 tablet (5 mg total) by mouth at bedtime. 04/05/21   Gilles Chiquito, MD  oxyCODONE-acetaminophen (PERCOCET) 10-325 MG tablet Take 1 tablet by mouth every 6 (six) hours as needed for pain. 03/03/21 03/03/22  Willy Eddy, MD  pantoprazole  (PROTONIX) 40 MG tablet Take 1 tablet (40 mg total) by mouth 2 (two) times daily. 04/05/21 05/05/21  Gilles Chiquito, MD  polyethylene glycol (MIRALAX / GLYCOLAX) 17 g packet Take 17 g by mouth daily. 04/05/21 05/05/21  Gilles Chiquito, MD  tamsulosin (FLOMAX) 0.4 MG CAPS capsule Take 1 capsule (0.4 mg total) by mouth daily for 7 days. 04/05/21 04/12/21  Gilles Chiquito, MD    Allergies Patient has no known allergies.  No family history on file.  Social History Social History   Tobacco Use   Smoking status: Former   Smokeless tobacco: Never  Substance Use Topics   Alcohol use: Not Currently    Review of Systems  Review of Systems  Constitutional:  Positive for malaise/fatigue. Negative for chills and fever.  HENT:  Negative for sore throat.   Eyes:  Negative for pain.  Respiratory:  Negative for cough and stridor.   Cardiovascular:  Negative for chest pain.  Gastrointestinal:  Positive for diarrhea, nausea and vomiting.  Musculoskeletal:  Positive for back pain and myalgias.  Skin:  Negative for rash.  Neurological:  Positive for headaches (intermittent over last several weeks). Negative for seizures and loss of consciousness.  Endo/Heme/Allergies:  Positive for polydipsia.  Psychiatric/Behavioral:  Negative for suicidal ideas.   All other systems reviewed and are negative.    ____________________________________________   PHYSICAL EXAM:  VITAL SIGNS: ED Triage Vitals  Enc Vitals Group     BP 04/05/21 1314 (!) 160/99     Pulse Rate 04/05/21 1314 99     Resp 04/05/21 1314 18     Temp 04/05/21 1314 98 F (36.7 C)     Temp src --      SpO2 04/05/21 1314 100 %     Weight --      Height --      Head Circumference --      Peak Flow --      Pain Score 04/05/21 1313 10     Pain Loc --      Pain Edu? --      Excl. in GC? --    Vitals:   04/05/21 1638 04/05/21 1700  BP: (!) 214/131 (!) 206/110  Pulse: (!) 101 93  Resp: 20   Temp:    SpO2: 100% 96%   Physical  Exam Vitals and nursing note reviewed.  Constitutional:      Appearance: He is well-developed.  HENT:     Head: Normocephalic and atraumatic.     Right Ear: External ear normal.     Left Ear: External ear normal.     Nose: Nose normal.     Mouth/Throat:     Mouth: Mucous membranes are dry.  Eyes:     Conjunctiva/sclera: Conjunctivae normal.  Cardiovascular:     Rate and Rhythm: Normal rate and regular rhythm.     Heart sounds: No murmur heard. Pulmonary:     Effort:  Pulmonary effort is normal. No respiratory distress.     Breath sounds: Normal breath sounds.  Abdominal:     Palpations: Abdomen is soft.     Tenderness: There is no abdominal tenderness.  Musculoskeletal:     Cervical back: Neck supple.     Left Lower Extremity: Left leg is amputated below knee.  Skin:    General: Skin is warm and dry.     Capillary Refill: Capillary refill takes 2 to 3 seconds.  Neurological:     Mental Status: He is alert and oriented to person, place, and time.  Psychiatric:        Mood and Affect: Mood normal.    Symmetric bilateral upper and lower extremity strength.  Sensation intact light touch all extremities.  2+ radial pulse.  1+ right DP pulse.  Patient's right extremity.  Warm and well-perfused.  There is some mild tenderness of the bilateral thoracic and lumbar muscles without overlying erythema, induration, fluctuance or other acute overlying change.  No C-spine tenderness.  Cranial nerves II to XII are grossly intact.  Scattered erythematous circular lesions that are blanchable over the arms legs back and abdomen consistent with dermatitis from bedbug bites as reported. ____________________________________________   LABS (all labs ordered are listed, but only abnormal results are displayed)  Labs Reviewed  CBC - Abnormal; Notable for the following components:      Result Value   WBC 10.6 (*)    All other components within normal limits  BASIC METABOLIC PANEL - Abnormal;  Notable for the following components:   Sodium 130 (*)    Chloride 96 (*)    Glucose, Bld 527 (*)    Calcium 8.7 (*)    All other components within normal limits  URINALYSIS, COMPLETE (UACMP) WITH MICROSCOPIC - Abnormal; Notable for the following components:   Color, Urine YELLOW (*)    APPearance CLEAR (*)    Glucose, UA >=500 (*)    Protein, ur >=300 (*)    All other components within normal limits  CBG MONITORING, ED - Abnormal; Notable for the following components:   Glucose-Capillary 370 (*)    All other components within normal limits  HEPATIC FUNCTION PANEL  HEMOGLOBIN A1C   ____________________________________________  EKG  ECG shows sinus rhythm with a rate of 99, normal axis, unremarkable intervals without clear evidence of acute ischemia or significant arrhythmia. ____________________________________________  RADIOLOGY  ED MD interpretation: Plain film of the L-spine shows some facet arthropathy without acute abnormality noted.  There is some spondylolisthesis noted.  Official radiology report(s): DG Lumbar Spine Complete  Result Date: 04/05/2021 CLINICAL DATA:  Back pain after falling 2 days ago. EXAM: LUMBAR SPINE - COMPLETE 4+ VIEW COMPARISON:  01/19/2008 and the abdominopelvic CT of 10/23/2019. FINDINGS: Five lumbar type vertebral bodies. Sacroiliac joints are symmetric. Maintenance of vertebral body height and alignment. Mild loss of intervertebral disc height at the lumbosacral junction. Facet arthropathy at L4-5 and L5-S1. IMPRESSION: No acute osseous abnormality.  Mild spondylosis. Electronically Signed   By: Jeronimo Greaves M.D.   On: 04/05/2021 18:21    ____________________________________________   PROCEDURES  Procedure(s) performed (including Critical Care):  Procedures   ____________________________________________   INITIAL IMPRESSION / ASSESSMENT AND PLAN / ED COURSE     Patient presents with above-stated history exam for assessment of multiple  complaints primarily myalgias that he cannot localize and pain in his low back that he states is chronic but worse than usual because he ran out of his  chronic Percocets a couple days ago.  He states this is been associate with some nonbloody nonbilious vomiting diarrhea and that this happens every month and has been going on for several months.  He thinks he may have fallen a couple days ago and is not exactly sure and denies hitting his head or any LOC or other focal pain at this time.  No incontinence.  Patient has a nonfocal neurological exam without any overlying skin changes over his back and is denying incontinence.  I have a low suspicion for acute spinal cord compression at this time.  Patient does appear mildly dehydrated.  He does appear to have bedbug bites scattered over the extremities chest and back.  No evidence of cellulitis at this time.  BMP remarkable for glucose of 527 and a sodium of 130 which corrects to normal taking consideration glucose without any other acute metabolic or electrolyte derangements.  Hepatic function panel is unremarkable for evidence of cholestasis or hepatitis and Evalose patient for cholecystitis pancreatitis at this time given absence of significant abdominal pain tenderness leukocytosis or fever.  UA has some protein and glucose but no evidence of infection or blood.   Plain film of the L-spine shows some facet arthropathy without acute abnormality noted.  There is some spondylolisthesis noted.  Has certainly possible patient is having some mild withdrawal given reported GI symptoms and myalgias in the setting of running out of his Percocets.  Additional differential includes dehydration related to hyperglycemia which it seems related to patient running out of his insulin versus a factious gastroenteritis.  He was treated with sliding-scale insulin and some IV fluids as well as given home dose of his home opioids and antihypertensives which he also states he  had taken several days.  He was given home dose of his analgesia and advised him that any further outpatient prescriptions need to be managed by his pain physician.  He states he understands this.  Refills provided for all of his other medications as he states he did not currently have any of them.  Discharged stable condition.  Strict return precautions advised and discussed.  Emphasized importance of close outpatient PCP follow-up to have his blood pressure and glucose rechecked.       ____________________________________________   FINAL CLINICAL IMPRESSION(S) / ED DIAGNOSES  Final diagnoses:  Chronic midline low back pain without sciatica  Hyperglycemia  Nausea vomiting and diarrhea  Infestation by bed bug  Chronic midline low back pain, unspecified whether sciatica present  Medication refill    Medications  insulin aspart (novoLOG) injection 0-15 Units (15 Units Subcutaneous Given 04/05/21 1635)  amLODipine (NORVASC) tablet 10 mg (10 mg Oral Given 04/05/21 1730)  hydrochlorothiazide (HYDRODIURIL) tablet 25 mg (25 mg Oral Given 04/05/21 1730)  tamsulosin (FLOMAX) capsule 0.4 mg (0.4 mg Oral Given 04/05/21 1730)  acetaminophen (TYLENOL) tablet 650 mg (has no administration in time range)  oxyCODONE (Oxy IR/ROXICODONE) immediate release tablet 5 mg (has no administration in time range)  gabapentin (NEURONTIN) tablet 300 mg (has no administration in time range)  lactated ringers bolus 1,000 mL (1,000 mLs Intravenous New Bag/Given 04/05/21 1641)  ondansetron (ZOFRAN) injection 4 mg (4 mg Intravenous Given 04/05/21 1636)  oxyCODONE-acetaminophen (PERCOCET/ROXICET) 5-325 MG per tablet 1 tablet (1 tablet Oral Given 04/05/21 1615)    And  oxyCODONE (Oxy IR/ROXICODONE) immediate release tablet 5 mg (5 mg Oral Given 04/05/21 1615)     ED Discharge Orders          Ordered  amLODipine (NORVASC) 10 MG tablet  Daily        04/05/21 1834    aspirin EC 81 MG tablet  Daily        04/05/21  1834    clopidogrel (PLAVIX) 75 MG tablet  Daily        04/05/21 1834    pantoprazole (PROTONIX) 40 MG tablet  2 times daily        04/05/21 1834    hydrochlorothiazide (HYDRODIURIL) 25 MG tablet  Daily        04/05/21 1834    OLANZapine (ZYPREXA) 5 MG tablet  Daily at bedtime        04/05/21 1834    tamsulosin (FLOMAX) 0.4 MG CAPS capsule  Daily        04/05/21 1834    polyethylene glycol (MIRALAX / GLYCOLAX) 17 g packet  Daily        04/05/21 1834    gabapentin (NEURONTIN) 300 MG capsule  2 times daily        04/05/21 1834             Note:  This document was prepared using Dragon voice recognition software and may include unintentional dictation errors.    Gilles Chiquito, MD 04/05/21 [phone]

## 2021-04-05 NOTE — ED Triage Notes (Addendum)
Pt comes with c/o left sided back pain. Pt states some vomiting. Pt also states he is out of his pain meds and feels like he is going into withdrawals.  Pt states more pain with urination and frequency.  Pt is out of meds for diabetes as well.  Pt states sore all over body from bed bugs.

## 2021-04-05 NOTE — ED Notes (Signed)
MD Katrinka Blazing informed of pt's glucose level result

## 2021-04-05 NOTE — ED Notes (Signed)
Patient transported to CT 

## 2021-04-07 LAB — HEMOGLOBIN A1C
Hgb A1c MFr Bld: 14.5 % — ABNORMAL HIGH (ref 4.8–5.6)
Mean Plasma Glucose: 369 mg/dL

## 2021-05-16 ENCOUNTER — Inpatient Hospital Stay
Admission: EM | Admit: 2021-05-16 | Discharge: 2021-05-20 | DRG: 872 | Disposition: A | Discharge status: Home / Self Care | Payer: Medicare PPO | Attending: Student in an Organized Health Care Education/Training Program | Admitting: Student in an Organized Health Care Education/Training Program

## 2021-05-16 ENCOUNTER — Other Ambulatory Visit: Payer: Self-pay

## 2021-05-16 ENCOUNTER — Emergency Department: Payer: Medicare PPO

## 2021-05-16 ENCOUNTER — Encounter: Payer: Self-pay | Admitting: Emergency Medicine

## 2021-05-16 DIAGNOSIS — E876 Hypokalemia: Secondary | ICD-10-CM | POA: Diagnosis not present

## 2021-05-16 DIAGNOSIS — J039 Acute tonsillitis, unspecified: Secondary | ICD-10-CM | POA: Diagnosis present

## 2021-05-16 DIAGNOSIS — F19951 Other psychoactive substance use, unspecified with psychoactive substance-induced psychotic disorder with hallucinations: Secondary | ICD-10-CM

## 2021-05-16 DIAGNOSIS — R6520 Severe sepsis without septic shock: Secondary | ICD-10-CM | POA: Diagnosis present

## 2021-05-16 DIAGNOSIS — Z91199 Patient's noncompliance with other medical treatment and regimen due to unspecified reason: Secondary | ICD-10-CM

## 2021-05-16 DIAGNOSIS — R1031 Right lower quadrant pain: Secondary | ICD-10-CM

## 2021-05-16 DIAGNOSIS — R739 Hyperglycemia, unspecified: Secondary | ICD-10-CM | POA: Diagnosis not present

## 2021-05-16 DIAGNOSIS — F17210 Nicotine dependence, cigarettes, uncomplicated: Secondary | ICD-10-CM | POA: Diagnosis present

## 2021-05-16 DIAGNOSIS — Z79899 Other long term (current) drug therapy: Secondary | ICD-10-CM

## 2021-05-16 DIAGNOSIS — Z794 Long term (current) use of insulin: Secondary | ICD-10-CM

## 2021-05-16 DIAGNOSIS — L03115 Cellulitis of right lower limb: Secondary | ICD-10-CM | POA: Diagnosis present

## 2021-05-16 DIAGNOSIS — F29 Unspecified psychosis not due to a substance or known physiological condition: Secondary | ICD-10-CM | POA: Diagnosis present

## 2021-05-16 DIAGNOSIS — Z89612 Acquired absence of left leg above knee: Secondary | ICD-10-CM

## 2021-05-16 DIAGNOSIS — A419 Sepsis, unspecified organism: Secondary | ICD-10-CM | POA: Diagnosis not present

## 2021-05-16 DIAGNOSIS — K3580 Unspecified acute appendicitis: Secondary | ICD-10-CM | POA: Diagnosis present

## 2021-05-16 DIAGNOSIS — Z5900 Homelessness unspecified: Secondary | ICD-10-CM

## 2021-05-16 DIAGNOSIS — E1151 Type 2 diabetes mellitus with diabetic peripheral angiopathy without gangrene: Secondary | ICD-10-CM | POA: Diagnosis present

## 2021-05-16 DIAGNOSIS — E1143 Type 2 diabetes mellitus with diabetic autonomic (poly)neuropathy: Secondary | ICD-10-CM | POA: Diagnosis present

## 2021-05-16 DIAGNOSIS — E1165 Type 2 diabetes mellitus with hyperglycemia: Secondary | ICD-10-CM | POA: Diagnosis present

## 2021-05-16 DIAGNOSIS — Z20822 Contact with and (suspected) exposure to covid-19: Secondary | ICD-10-CM | POA: Diagnosis present

## 2021-05-16 DIAGNOSIS — I160 Hypertensive urgency: Secondary | ICD-10-CM | POA: Diagnosis present

## 2021-05-16 DIAGNOSIS — I2510 Atherosclerotic heart disease of native coronary artery without angina pectoris: Secondary | ICD-10-CM | POA: Diagnosis present

## 2021-05-16 DIAGNOSIS — Z955 Presence of coronary angioplasty implant and graft: Secondary | ICD-10-CM

## 2021-05-16 DIAGNOSIS — I10 Essential (primary) hypertension: Secondary | ICD-10-CM | POA: Diagnosis present

## 2021-05-16 DIAGNOSIS — Z91013 Allergy to seafood: Secondary | ICD-10-CM

## 2021-05-16 DIAGNOSIS — Z7982 Long term (current) use of aspirin: Secondary | ICD-10-CM

## 2021-05-16 DIAGNOSIS — Z23 Encounter for immunization: Secondary | ICD-10-CM

## 2021-05-16 DIAGNOSIS — G8929 Other chronic pain: Secondary | ICD-10-CM | POA: Diagnosis present

## 2021-05-16 LAB — CBC WITH DIFFERENTIAL/PLATELET
Abs Immature Granulocytes: 0.08 10*3/uL — ABNORMAL HIGH (ref 0.00–0.07)
Basophils Absolute: 0.1 10*3/uL (ref 0.0–0.1)
Basophils Relative: 0 %
Eosinophils Absolute: 0.1 10*3/uL (ref 0.0–0.5)
Eosinophils Relative: 1 %
HCT: 39 % (ref 39.0–52.0)
Hemoglobin: 13.4 g/dL (ref 13.0–17.0)
Immature Granulocytes: 1 %
Lymphocytes Relative: 13 %
Lymphs Abs: 1.8 10*3/uL (ref 0.7–4.0)
MCH: 32.8 pg (ref 26.0–34.0)
MCHC: 34.4 g/dL (ref 30.0–36.0)
MCV: 95.6 fL (ref 80.0–100.0)
Monocytes Absolute: 0.6 10*3/uL (ref 0.1–1.0)
Monocytes Relative: 4 %
Neutro Abs: 11.7 10*3/uL — ABNORMAL HIGH (ref 1.7–7.7)
Neutrophils Relative %: 81 %
Platelets: 319 10*3/uL (ref 150–400)
RBC: 4.08 MIL/uL — ABNORMAL LOW (ref 4.22–5.81)
RDW: 12 % (ref 11.5–15.5)
WBC: 14.3 10*3/uL — ABNORMAL HIGH (ref 4.0–10.5)
nRBC: 0 % (ref 0.0–0.2)

## 2021-05-16 LAB — URINALYSIS, COMPLETE (UACMP) WITH MICROSCOPIC
Bacteria, UA: NONE SEEN
Bilirubin Urine: NEGATIVE
Glucose, UA: >=500 mg/dL — AB
Ketones, ur: NEGATIVE mg/dL
Leukocytes,Ua: NEGATIVE
Nitrite: NEGATIVE
Protein, ur: 100 mg/dL — AB
Specific Gravity, Urine: 1.027 (ref 1.005–1.030)
pH: 7 (ref 5.0–8.0)

## 2021-05-16 LAB — TROPONIN I (HIGH SENSITIVITY)
Troponin I (High Sensitivity): 12 ng/L (ref <18)
Troponin I (High Sensitivity): 10 ng/L (ref <18)

## 2021-05-16 LAB — COMPREHENSIVE METABOLIC PANEL
ALT: 10 U/L (ref 0–44)
AST: 14 U/L — ABNORMAL LOW (ref 15–41)
Albumin: 3.2 g/dL — ABNORMAL LOW (ref 3.5–5.0)
Alkaline Phosphatase: 122 U/L (ref 38–126)
Anion gap: 9 (ref 5–15)
BUN: 13 mg/dL (ref 6–20)
CO2: 29 mmol/L (ref 22–32)
Calcium: 8.6 mg/dL — ABNORMAL LOW (ref 8.9–10.3)
Chloride: 93 mmol/L — ABNORMAL LOW (ref 98–111)
Creatinine, Ser: 1.1 mg/dL (ref 0.61–1.24)
GFR, Estimated: >60 mL/min (ref >60)
Glucose, Bld: 560 mg/dL (ref 70–99)
Potassium: 3.6 mmol/L (ref 3.5–5.1)
Sodium: 131 mmol/L — ABNORMAL LOW (ref 135–145)
Total Bilirubin: 0.7 mg/dL (ref 0.3–1.2)
Total Protein: 7 g/dL (ref 6.5–8.1)

## 2021-05-16 LAB — LIPASE, BLOOD: Lipase: 26 U/L (ref 11–51)

## 2021-05-16 LAB — RESP PANEL BY RT-PCR (FLU A&B, COVID) ARPGX2
Influenza A by PCR: NEGATIVE
Influenza B by PCR: NEGATIVE
SARS Coronavirus 2 by RT PCR: NEGATIVE

## 2021-05-16 LAB — CBG MONITORING, ED: Glucose-Capillary: 353 mg/dL — ABNORMAL HIGH (ref 70–99)

## 2021-05-16 LAB — GROUP A STREP BY PCR: Group A Strep by PCR: NOT DETECTED

## 2021-05-16 LAB — LACTIC ACID, PLASMA
Lactic Acid, Venous: 1.2 mmol/L (ref 0.5–1.9)
Lactic Acid, Venous: 1.2 mmol/L (ref 0.5–1.9)

## 2021-05-16 MED ORDER — NICOTINE 14 MG/24HR TD PT24: 14.0000 mg | MEDICATED_PATCH | Freq: Every day | TRANSDERMAL | Status: DC | PRN

## 2021-05-16 MED ORDER — METRONIDAZOLE 500 MG/100ML IV SOLN
500.0000 mg | Freq: Once | INTRAVENOUS | Status: DC
Start: 2021-05-16 — End: 2021-05-16

## 2021-05-16 MED ORDER — INSULIN DETEMIR 100 UNIT/ML ~~LOC~~ SOLN
20.0000 [IU] | Freq: Two times a day (BID) | SUBCUTANEOUS | Status: DC
Start: 2021-05-16 — End: 2021-05-20
  Administered 2021-05-16 – 2021-05-19 (×7): 20 [IU] via SUBCUTANEOUS
  Filled 2021-05-16 (×8): qty 0.2

## 2021-05-16 MED ORDER — LABETALOL HCL 5 MG/ML IV SOLN
10.0000 mg | Freq: Once | INTRAVENOUS | Status: AC
  Administered 2021-05-16: 10 mg via INTRAVENOUS
  Filled 2021-05-16: qty 4

## 2021-05-16 MED ORDER — LACTATED RINGERS IV BOLUS: 1000.0000 mL | Freq: Once | INTRAVENOUS | Status: DC

## 2021-05-16 MED ORDER — ONDANSETRON HCL 4 MG PO TABS: 4.0000 mg | ORAL_TABLET | Freq: Four times a day (QID) | ORAL | Status: DC | PRN

## 2021-05-16 MED ORDER — GABAPENTIN 300 MG PO CAPS
300.0000 mg | ORAL_CAPSULE | Freq: Two times a day (BID) | ORAL | Status: DC
  Administered 2021-05-17 (×2): 300 mg via ORAL
  Filled 2021-05-16 (×2): qty 1

## 2021-05-16 MED ORDER — AMLODIPINE BESYLATE 10 MG PO TABS
10.0000 mg | ORAL_TABLET | Freq: Every day | ORAL | Status: DC
  Administered 2021-05-17 – 2021-05-20 (×5): 10 mg via ORAL
  Filled 2021-05-16: qty 2
  Filled 2021-05-16 (×5): qty 1

## 2021-05-16 MED ORDER — SODIUM CHLORIDE 0.9 % IV BOLUS
1000.0000 mL | Freq: Once | INTRAVENOUS | Status: AC
  Administered 2021-05-16: 1000 mL via INTRAVENOUS

## 2021-05-16 MED ORDER — ACETAMINOPHEN 325 MG PO TABS: 650.0000 mg | ORAL_TABLET | Freq: Four times a day (QID) | ORAL | Status: AC | PRN

## 2021-05-16 MED ORDER — ONDANSETRON HCL 4 MG/2ML IJ SOLN
4.0000 mg | Freq: Once | INTRAMUSCULAR | Status: AC
  Administered 2021-05-16: 4 mg via INTRAVENOUS
  Filled 2021-05-16: qty 2

## 2021-05-16 MED ORDER — ACETAMINOPHEN 650 MG RE SUPP: 650.0000 mg | Freq: Four times a day (QID) | RECTAL | Status: AC | PRN

## 2021-05-16 MED ORDER — INSULIN ASPART 100 UNIT/ML IJ SOLN: 15.0000 [IU] | Freq: Once | INTRAMUSCULAR | Status: DC

## 2021-05-16 MED ORDER — METRONIDAZOLE 500 MG/100ML IV SOLN
500.0000 mg | Freq: Two times a day (BID) | INTRAVENOUS | Status: DC
  Administered 2021-05-16 – 2021-05-17 (×2): 500 mg via INTRAVENOUS
  Filled 2021-05-16 (×3): qty 100

## 2021-05-16 MED ORDER — LABETALOL HCL 5 MG/ML IV SOLN: 5.0000 mg | INTRAVENOUS | Status: DC | PRN

## 2021-05-16 MED ORDER — ONDANSETRON HCL 4 MG/2ML IJ SOLN
4.0000 mg | Freq: Four times a day (QID) | INTRAMUSCULAR | Status: DC | PRN
  Administered 2021-05-16: 4 mg via INTRAVENOUS
  Filled 2021-05-16: qty 2

## 2021-05-16 MED ORDER — SODIUM CHLORIDE 0.9 % IV SOLN: 1.0000 g | Freq: Once | INTRAVENOUS | Status: DC

## 2021-05-16 MED ORDER — IOHEXOL 300 MG/ML  SOLN
100.0000 mL | Freq: Once | INTRAMUSCULAR | Status: AC | PRN
  Administered 2021-05-16: 100 mL via INTRAVENOUS
  Filled 2021-05-16: qty 100

## 2021-05-16 MED ORDER — OXYCODONE-ACETAMINOPHEN 5-325 MG PO TABS
1.0000 | ORAL_TABLET | Freq: Four times a day (QID) | ORAL | Status: DC | PRN
  Administered 2021-05-16: 1 via ORAL
  Filled 2021-05-16: qty 1

## 2021-05-16 MED ORDER — OLANZAPINE 5 MG PO TABS
5.0000 mg | ORAL_TABLET | Freq: Every day | ORAL | Status: DC
  Administered 2021-05-17 – 2021-05-19 (×4): 5 mg via ORAL
  Filled 2021-05-16 (×4): qty 1

## 2021-05-16 MED ORDER — ASPIRIN EC 81 MG PO TBEC
81.0000 mg | DELAYED_RELEASE_TABLET | Freq: Every day | ORAL | Status: DC
  Administered 2021-05-17 – 2021-05-20 (×4): 81 mg via ORAL
  Filled 2021-05-16 (×5): qty 1

## 2021-05-16 MED ORDER — LABETALOL HCL 5 MG/ML IV SOLN
10.0000 mg | INTRAVENOUS | Status: DC | PRN
  Administered 2021-05-16: 10 mg via INTRAVENOUS
  Filled 2021-05-16: qty 4

## 2021-05-16 MED ORDER — INSULIN ASPART 100 UNIT/ML IJ SOLN
0.0000 [IU] | Freq: Three times a day (TID) | INTRAMUSCULAR | Status: DC
  Administered 2021-05-17 (×2): 5 [IU] via SUBCUTANEOUS
  Administered 2021-05-18: 3 [IU] via SUBCUTANEOUS
  Administered 2021-05-19: 11 [IU] via SUBCUTANEOUS
  Administered 2021-05-20 (×2): 2 [IU] via SUBCUTANEOUS
  Filled 2021-05-16 (×7): qty 1

## 2021-05-16 MED ORDER — INSULIN ASPART 100 UNIT/ML IJ SOLN
0.0000 [IU] | Freq: Every day | INTRAMUSCULAR | Status: DC
  Administered 2021-05-17: 4 [IU] via SUBCUTANEOUS
  Administered 2021-05-19: 5 [IU] via SUBCUTANEOUS
  Filled 2021-05-16 (×3): qty 1

## 2021-05-16 MED ORDER — PANTOPRAZOLE SODIUM 40 MG PO TBEC
40.0000 mg | DELAYED_RELEASE_TABLET | Freq: Two times a day (BID) | ORAL | Status: DC
  Administered 2021-05-17 – 2021-05-20 (×8): 40 mg via ORAL
  Filled 2021-05-16 (×7): qty 1

## 2021-05-16 MED ORDER — LACTATED RINGERS IV BOLUS
500.0000 mL | Freq: Once | INTRAVENOUS | Status: AC
  Administered 2021-05-16: 500 mL via INTRAVENOUS

## 2021-05-16 MED ORDER — MORPHINE SULFATE (PF) 2 MG/ML IV SOLN
2.0000 mg | INTRAVENOUS | Status: AC | PRN
  Administered 2021-05-16 – 2021-05-18 (×4): 2 mg via INTRAVENOUS
  Filled 2021-05-16 (×5): qty 1

## 2021-05-16 MED ORDER — HYDROCHLOROTHIAZIDE 25 MG PO TABS
25.0000 mg | ORAL_TABLET | Freq: Every day | ORAL | Status: DC
  Administered 2021-05-16 – 2021-05-19 (×4): 25 mg via ORAL
  Filled 2021-05-16 (×4): qty 1

## 2021-05-16 MED ORDER — NICOTINE 21 MG/24HR TD PT24: 21.0000 mg | MEDICATED_PATCH | Freq: Every day | TRANSDERMAL | Status: DC | PRN

## 2021-05-16 MED ORDER — SODIUM CHLORIDE 0.9 % IV SOLN
2.0000 g | Freq: Once | INTRAVENOUS | Status: AC
  Administered 2021-05-16: 2 g via INTRAVENOUS
  Filled 2021-05-16: qty 20

## 2021-05-16 MED ORDER — LACTATED RINGERS IV SOLN: INTRAVENOUS | Status: DC

## 2021-05-16 MED ORDER — HEPARIN SODIUM (PORCINE) 5000 UNIT/ML IJ SOLN
5000.0000 [IU] | Freq: Three times a day (TID) | INTRAMUSCULAR | Status: DC
  Administered 2021-05-17 – 2021-05-20 (×11): 5000 [IU] via SUBCUTANEOUS
  Filled 2021-05-16 (×11): qty 1

## 2021-05-16 MED ORDER — KETOROLAC TROMETHAMINE 30 MG/ML IJ SOLN
15.0000 mg | Freq: Once | INTRAMUSCULAR | Status: AC
  Administered 2021-05-16: 15 mg via INTRAVENOUS
  Filled 2021-05-16: qty 1

## 2021-05-16 MED ORDER — MENTHOL 3 MG MT LOZG
1.0000 | LOZENGE | OROMUCOSAL | Status: DC | PRN
  Filled 2021-05-16: qty 9

## 2021-05-16 MED ORDER — LABETALOL HCL 5 MG/ML IV SOLN: 10.0000 mg | INTRAVENOUS | Status: DC | PRN

## 2021-05-16 MED ORDER — INSULIN DETEMIR 100 UNIT/ML ~~LOC~~ SOLN: 20.0000 [IU] | Freq: Two times a day (BID) | SUBCUTANEOUS | Status: DC

## 2021-05-16 MED ORDER — INSULIN DETEMIR 100 UNIT/ML ~~LOC~~ SOLN
20.0000 [IU] | Freq: Two times a day (BID) | SUBCUTANEOUS | Status: DC
  Filled 2021-05-16 (×2): qty 0.2

## 2021-05-16 MED ORDER — SODIUM CHLORIDE 0.9 % IV SOLN
2.0000 g | INTRAVENOUS | Status: DC
  Filled 2021-05-16: qty 20

## 2021-05-16 MED ORDER — LEVETIRACETAM 500 MG PO TABS
500.0000 mg | ORAL_TABLET | Freq: Every day | ORAL | Status: DC
  Administered 2021-05-17 – 2021-05-20 (×4): 500 mg via ORAL
  Filled 2021-05-16 (×4): qty 1

## 2021-05-16 MED ORDER — CLOPIDOGREL BISULFATE 75 MG PO TABS
75.0000 mg | ORAL_TABLET | Freq: Every day | ORAL | Status: DC
  Administered 2021-05-17 – 2021-05-20 (×4): 75 mg via ORAL
  Filled 2021-05-16 (×4): qty 1

## 2021-05-16 NOTE — ED Provider Notes (Signed)
[facility]  ____________________________________________   Event Date/Time   First MD Initiated Contact with Patient 05/16/21 1342     (approximate)  I have reviewed the triage vital signs and the nursing notes.   HISTORY  Chief Complaint Otalgia, Fever, and Wound Check    HPI Samuel [name] is a 60 y.o. male with past medical history of CAD, diabetes on insulin, left AKA and right TMA who presents with multiple concerns.  He endorses ear pain sore throat cough and myalgias times several days. He has cough, shortness of breath and chest pain.  Also complaining of abdominal pain nausea and diarrhea.  The other main issue is that he broke his prosthetic leg about 2 weeks ago and initially went to the person who deals with his prosthetic in [place] who told him that he needed to get set up closer to where he lives in [place].  He thus had an appointment in [place] to get a new prosthetic however he tells me that when he went to the appointment they would not see him because he never confirmed whether he was coming or not.  He does has had to crawl around on his hands and knees and now has abrasions on his right foot.  He is been prescribed Keflex by the physician in [place] due to concern for some infection on the foot.  He is currently staying with his sister in her trailer which does not have room for his wheelchair this he has not been able to use it.  Over tomorrow he is going to a boarding home which will have accommodations for his wheelchair.         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 ([place])    Essential hypertension     Psychosis ([place]) 10/23/2019   Ureterolithiasis 10/19/2019   Hyperglycemic crisis in diabetes mellitus ([place]) 10/18/2019    Past Surgical History:  Procedure Laterality Date   CORONARY ANGIOPLASTY WITH STENT PLACEMENT     CYSTOSCOPY/URETEROSCOPY/HOLMIUM LASER/STENT PLACEMENT Right 10/20/2019   Procedure: CYSTOSCOPY/URETEROSCOPY/HOLMIUM LASER/STENT PLACEMENT;  Surgeon: Billey Co, MD;  Location: [facility] ORS;  Service: Urology;  Laterality: Right;   ESOPHAGOGASTRODUODENOSCOPY N/A 10/21/2019   Procedure: ESOPHAGOGASTRODUODENOSCOPY (EGD);  Surgeon: Lin Landsman, MD;  Location: [facility] ENDOSCOPY;  Service: Gastroenterology;  Laterality: N/A;   FOOT AMPUTATION     HERNIA REPAIR     INCISION AND DRAINAGE PERIRECTAL ABSCESS N/A 09/20/2019   Procedure: IRRIGATION AND DEBRIDEMENT PERIRECTAL ABSCESS;  Surgeon: Herbert Pun, MD;  Location: [facility] ORS;  Service: General;  Laterality: N/A;    Prior to Admission medications   Medication Sig Start Date End Date Taking? Authorizing Provider  amLODipine (NORVASC) 10 MG tablet Take 1 tablet (10 mg total) by mouth daily. 04/05/21   Lucrezia Starch, MD  aspirin EC 81 MG tablet Take 1 tablet (81 mg total) by mouth daily. 04/05/21   Lucrezia Starch, MD  clopidogrel (PLAVIX) 75 MG tablet Take 1 tablet (75 mg total) by mouth daily. 04/05/21   Lucrezia Starch, MD  gabapentin (NEURONTIN) 300 MG capsule Take 1 capsule (300 mg total) by mouth 2 (two) times daily for  7 days. 04/05/21 04/12/21  Lucrezia Starch, MD  hydrochlorothiazide (HYDRODIURIL) 25 MG tablet Take 1 tablet (25 mg total) by mouth daily. 04/05/21   Lucrezia Starch, MD  insulin detemir (LEVEMIR) 100 UNIT/ML injection Inject 0.2 mLs (20 Units total) into the skin 2 (two) times daily. 10/28/19   Clapacs, Madie Reno, MD  OLANZapine (ZYPREXA) 5 MG tablet Take 1 tablet (5 mg total) by mouth at bedtime. 04/05/21   Lucrezia Starch, MD  oxyCODONE-acetaminophen (PERCOCET) 10-325 MG tablet Take 1 tablet by mouth  every 6 (six) hours as needed for pain. 03/03/21 03/03/22  Merlyn Lot, MD  pantoprazole (PROTONIX) 40 MG tablet Take 1 tablet (40 mg total) by mouth 2 (two) times daily. 04/05/21 05/05/21  Lucrezia Starch, MD    Allergies Patient has no known allergies.  History reviewed. No pertinent family history.  Social History Social History   Tobacco Use   Smoking status: Former   Smokeless tobacco: Never  Substance Use Topics   Alcohol use: Not Currently    Review of Systems   Review of Systems  Constitutional:  Positive for activity change, appetite change, chills and fever.  Respiratory:  Positive for cough and shortness of breath.   Cardiovascular:  Positive for chest pain.  Gastrointestinal:  Positive for abdominal pain, nausea and vomiting. Negative for diarrhea.  Genitourinary:  Negative for dysuria.  Skin:  Positive for wound.  All other systems reviewed and are negative.  Physical Exam Updated Vital Signs BP (!) 158/91 (BP Location: Right Arm)   Pulse (!) 121   Temp 98.6 F (37 C) (Oral)   Resp 20   Ht 5\' 10"  (1.778 m)   Wt 72.6 kg   SpO2 99%   BMI 22.96 kg/m   Physical Exam Vitals and nursing note reviewed.  Constitutional:      General: He is not in acute distress.    Appearance: Normal appearance.  HENT:     Head: Normocephalic and atraumatic.     Left Ear: Tympanic membrane normal.     Ears:     Comments: Unable to see right TM secondary to wax    Mouth/Throat:     Mouth: Mucous membranes are dry.     Comments: Scant exudate on the bilateral tonsils, mildly erythematous no significant swelling Eyes:     General: No scleral icterus.    Conjunctiva/sclera: Conjunctivae normal.  Cardiovascular:     Rate and Rhythm: Normal rate and regular rhythm.  Pulmonary:     Effort: Pulmonary effort is normal. No respiratory distress.     Breath sounds: Normal breath sounds. No wheezing.  Abdominal:     General: Abdomen is flat.     Palpations: Abdomen is  soft.     Comments: Tenderness to palpation of bilateral lower quadrants, soft without guarding  Musculoskeletal:     Cervical back: Normal range of motion.     Comments: Left AKA with prosthetic covering in place Right foot status post TMA with a small ulcer on the anterior surface with a small amount of surrounding erythema with mild warmth, no crepitus, no lymphatic streaking  Skin:    Coloration: Skin is not jaundiced or pale.  Neurological:     General: No focal deficit present.     Mental Status: He is alert and oriented to person, place, and time. Mental status is at baseline.  Psychiatric:        Mood and Affect: Mood normal.  Behavior: Behavior normal.     LABS (all labs ordered are listed, but only abnormal results are displayed)  Labs Reviewed  COMPREHENSIVE METABOLIC PANEL - Abnormal; Notable for the following components:      Result Value   Sodium 131 (*)    Chloride 93 (*)    Glucose, Bld 560 (*)    Calcium 8.6 (*)    Albumin 3.2 (*)    AST 14 (*)    All other components within normal limits  CBC WITH DIFFERENTIAL/PLATELET - Abnormal; Notable for the following components:   WBC 14.3 (*)    RBC 4.08 (*)    Neutro Abs 11.7 (*)    Abs Immature Granulocytes 0.08 (*)    All other components within normal limits  RESP PANEL BY RT-PCR (FLU A&B, COVID) ARPGX2  CULTURE, BLOOD (ROUTINE X 2)  CULTURE, BLOOD (ROUTINE X 2)  URINALYSIS, COMPLETE (UACMP) WITH MICROSCOPIC  LACTIC ACID, PLASMA  LACTIC ACID, PLASMA  CBG MONITORING, ED  TROPONIN I (HIGH SENSITIVITY)  TROPONIN I (HIGH SENSITIVITY)   ____________________________________________  EKG  ____________________________________________  RADIOLOGY I, Randol Kern, personally viewed and evaluated these images (plain radiographs) as part of my medical decision making, as well as reviewing the written report by the radiologist.  ED MD interpretation:  I reviewed the CXR which does not show any acute  cardiopulmonary process      ____________________________________________   PROCEDURES  Procedure(s) performed (including Critical Care):  Procedures   ____________________________________________   INITIAL IMPRESSION / ASSESSMENT AND PLAN / ED COURSE     Patient is a 59 year old male with multiple chronic comorbidities who presents with both a constellation of symptoms as well as not being able to use his wheelchair and a broken prosthetic.  His review of systems is rather pan positive including sore throat ear pain chest pain shortness of breath abdominal pain nausea and a wound on his right leg.  On exam he does appear somewhat dry and has some exudate in his tonsils but no evidence of PTA no significant swelling.  TMs look okay.  His abdomen is overall benign although he does have tenderness in the bilateral lower quadrants.  Right foot does look like there is a mild infection however is limited to a small ulceration with minimal surrounding cellulitis. his heart rate in triage was in the 120s, afebrile with normal blood pressure.  On my evaluation his heart rate is around 100.  Patient does have diabetes and has been without insulin and with his constellation of symptoms we will check basic labs including CMP and lipase, chest x-ray COVID, EKG and reassess need for abdominal imaging.  Patient's labs are notable for a leukocytosis of 14 and a blood sugar of 560.  He has no acidosis or anion gap to suggest DKA.  We will order for another liter of fluids.  On reevaluation patient still with abdominal tenderness.  Will obtain a CT abdomen pelvis.  Pending urine as well.  Signed out to oncoming provider pending CT abdomen and reassessment. Clinical Course as of 05/16/21 1626  Sun May 16, 2021  1617 Platelets: 319 [KM]    Clinical Course User Index [KM] Georga Hacking, MD     ____________________________________________   FINAL CLINICAL IMPRESSION(S) / ED DIAGNOSES  Final  diagnoses:  Hyperglycemia     ED Discharge Orders     None        Note:  This document was prepared using Dragon voice recognition software and  may include unintentional dictation errors.    Rada Hay, MD 05/16/21 1626

## 2021-05-16 NOTE — ED Notes (Signed)
Notified provider of critical glucose 560

## 2021-05-16 NOTE — ED Notes (Signed)
This RN attempted blood draw for second set of Blood cultures. Phlebotomy contacted and states they will be here soon to attempt

## 2021-05-16 NOTE — ED Notes (Signed)
Phlebotomy at bedside.

## 2021-05-16 NOTE — ED Triage Notes (Signed)
Pt via POV from home. Pt c/o L ear pain, sore throat, and fever 3 days ago. Pt states also his prothetic leg is broken and now has to crawl and he now has a wound on his R stump and R knee. Both wounds are clean and no signs of infection noted. Pt just does not have proper supplies to dress them. Pt is A&OX4 and NAD

## 2021-05-16 NOTE — H&P (Addendum)
[place] History and Physical   Samuel [name] MPN:[id number] DOB: Aug 02, 1960 DOA: 05/16/2021  PCP: Oneita Hurt, No  Outpatient Specialists: Dr. Allegra Lai, gastroenterology Patient coming from: Home via POV  I have personally briefly reviewed patient's old medical records in [facility] EMR.  Chief Concern: Fever for 3 days  HPI: Samuel [name] is a 60 y.o. male with medical history significant for insulin-dependent diabetes mellitus, hypertension, left AKA, right TMA, CAD, has left prosthesis, who presents to the emergency department for chief concerns of broken prosthesis.  The prosthesis was broken about two weeks ago. He had appointment [facility] on November 4th and he was told he did not have the appointment due to no confirmation.  He denies diarrhea.  His nephew/cousin recently had strep throat and he has been staying with his aunt.   He reports that over the last several days he has been having subjective fever, sore throat, cough, belly pain.  He also endorses that he is in order to get around has been crawling around the house.  He originally presented for the wounds due to crawling without a prosthesis.  Social history: He lives at home with his aunt. He endorses current tobacco use. He denies etoh and recreational drug use. He formerly worked in [employer]. He is currently disabled  Vaccination history: He is vaccinated for covid 19, with 2 doses of Moderna.    ROS: Constitutional: no weight change, no fever ENT/Mouth: no sore throat, no rhinorrhea Eyes: no eye pain, no vision changes Cardiovascular: no chest pain, no dyspnea,  no edema, no palpitations Respiratory: + cough, no sputum, no wheezing Gastrointestinal: no nausea, no vomiting, no diarrhea, no constipation Genitourinary: no urinary incontinence, no dysuria, no hematuria Musculoskeletal: no arthralgias, no myalgias Skin: no skin lesions, no pruritus, Neuro: + weakness, no loss of consciousness, no syncope Psych: no  anxiety, no depression, + decrease appetite Heme/Lymph: no bruising, no bleeding  ED Course: Discussed with emergency medicine provider, patient requiring hospitalization for chief concerns of early appendicitis.  Vitals in the emergency department was remarkable for temperature of 98.6, respiration rate 20, heart rate of 121, initial blood pressure 158/91, SPO2 of 99% on room air.  Blood pressure increased to 208/116.  Labs in the emergency department was remarkable for sodium 131, potassium 3.6, chloride 93, bicarb of 29, BUN of 13, serum creatinine of 1.10, nonfasting blood glucose 560, WBC 14.3, hemoglobin 13.4, platelets 319.  GFR greater than 60.  Anion gap was not elevated at 9.  High sensitive troponin was 12.  Lactic acid was 1.2.  COVID/influenza A/influenza B PCR were negative.  UA was negative for leukocytes and nitrates.  UA was positive for clear, straw-colored, glucose greater than 500.  And small hemoglobin.  In the emergency department patient was given 15 mg IV of Toradol, Zofran 4 mg IV, 2 L sodium chloride bolus.  ED provider ordered ceftriaxone and metronidazole.  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 Everlene Farrier who recommends hospitalist service to admit for observation -  We appreciate further recommendations from general surgery service - Admit to MedSurg, observation, telemetry - Metronidazole 500 mg  every 12 hours twice daily and ceftriaxone 2 g IV daily - Status post one-time dose of 1 g IV, metronidazole 500 mg IV, sodium chloride total of 2 L bolus ordered by EDP - Added lactated ringer 500 mL/h once, ordered lactated ringer IVF at 150 mL/h, 12 hours ordered - Ceftriaxone changed to 2 g IV daily, metronidazole 500 mg IV every 12 hours - Blood cultures x2 are in process; a.m. team to follow-up on - Symptomatic support: Morphine 2 mg IV every 3 hours as needed for severe pain, 4 doses ordered; oxycodone-acetaminophen 5-325 mg every 4 hours as needed for moderate pain, 3 doses ordered  # Tachycardia and hypertension-labetalol one-time dose, labetalol 10 mg IV once  # Hypertensive urgency/emergency - As needed: Labetalol 5 mg IV every 2 hours as needed for SBP greater than 180, 2 doses ordered - Resumed home antihypertensive medications amlodipine 10 mg daily, hydrochlorothiazide 25 mg daily - If patient remains resistant to antihypertensive medications, will start Cardene drip  # Hyperglycemia-presumed secondary to early appendicitis - Does not meet criteria for DKA or HHS - Insulin aspart 15 units subcutaneous was ordered however repeat blood glucose without insulin given showed improved to 353. - Resumed home insulin long-acting 20 units subcutaneous twice daily including now - A1c on 04/05/2021 was 14.5 - Insulin SSI with at bedtime coverage ordered - Patient would benefit from endocrinologist outpatient referral  # Possible cellulitis surrounding the TMA-treat with ceftriaxone  # Bilateral tonsillitis with erythema and mild edema - Group A strep by PCR ordered - Continue with ceftriaxone as above  # Tobacco dependence-nicotine patch ordered - Greater than 5 minutes spent on tobacco cessation counseling - Patient endorses readiness to stop tobacco use. - Tobacco cessation counseling:  Week one, smoke 19 cigarettes per day. Week two, smoke 18 cigarettes per day. Week three,  smoke 17 cigarettes per day, continue until smoking half the amount of cigarettes per day Discuss with PCP for pharmacologic assistance with smoking cessation Clean all indoor clothing, sheets, blankets, and freshen textile furniture to rid the smell of cigarettes Leave cigarettes and lighters outside in separate places Call [facility] if in need of nicotine patches to help with cessation  # Psychiatric imbalance-Zyprexa 5 mg nightly, Keppra 500 mg daily  Chart reviewed.   DVT prophylaxis: Heparin 5000 units subcutaneous every 8 hours Code Status: Full code Diet: Heart healthy/carb modified Family Communication: No Disposition Plan: Pending clinical course Consults called: General surgery Admission status: Observation, MedSurg, telemetry ordered  Past Medical History:  Diagnosis Date   Diabetes mellitus without complication ([place])    Hypertension    UTI (urinary tract infection)    Past Surgical History:  Procedure Laterality Date   CORONARY ANGIOPLASTY WITH STENT PLACEMENT     CYSTOSCOPY/URETEROSCOPY/HOLMIUM LASER/STENT PLACEMENT Right 10/20/2019   Procedure: CYSTOSCOPY/URETEROSCOPY/HOLMIUM LASER/STENT PLACEMENT;  Surgeon: Billey Co, MD;  Location: [facility] ORS;  Service: Urology;  Laterality: Right;   ESOPHAGOGASTRODUODENOSCOPY N/A 10/21/2019   Procedure: ESOPHAGOGASTRODUODENOSCOPY (EGD);  Surgeon: Lin Landsman, MD;  Location: [facility] END[facility]COPY;  Service: Gastroenterology;  Laterality: N/A;   FOOT AMPUTATION     HERNIA REPAIR     INCISION AND DRAINAGE PERIRECTAL ABSCESS N/A 09/20/2019   Procedure: IRRIGATION AND DEBRIDEMENT PERIRECTAL ABSCESS;  Surgeon: Herbert Pun, MD;  Location: [facility] ORS;  Service: General;  Laterality: N/A;   Social History:  reports that he  has quit smoking. He has never used smokeless tobacco. He reports that he does not currently use alcohol. No history on file for drug use.  No Known Allergies History reviewed. No pertinent family  history. Family history: Family history reviewed and not pertinent  Prior to Admission medications   Medication Sig Start Date End Date Taking? Authorizing Provider  amLODipine (NORVASC) 10 MG tablet Take 1 tablet (10 mg total) by mouth daily. 04/05/21   Lucrezia Starch, MD  aspirin EC 81 MG tablet Take 1 tablet (81 mg total) by mouth daily. 04/05/21   Lucrezia Starch, MD  clopidogrel (PLAVIX) 75 MG tablet Take 1 tablet (75 mg total) by mouth daily. 04/05/21   Lucrezia Starch, MD  gabapentin (NEURONTIN) 300 MG capsule Take 1 capsule (300 mg total) by mouth 2 (two) times daily for 7 days. 04/05/21 04/12/21  Lucrezia Starch, MD  hydrochlorothiazide (HYDRODIURIL) 25 MG tablet Take 1 tablet (25 mg total) by mouth daily. 04/05/21   Lucrezia Starch, MD  insulin detemir (LEVEMIR) 100 UNIT/ML injection Inject 0.2 mLs (20 Units total) into the skin 2 (two) times daily. 10/28/19   Clapacs, Madie Reno, MD  OLANZapine (ZYPREXA) 5 MG tablet Take 1 tablet (5 mg total) by mouth at bedtime. 04/05/21   Lucrezia Starch, MD  oxyCODONE-acetaminophen (PERCOCET) 10-325 MG tablet Take 1 tablet by mouth every 6 (six) hours as needed for pain. 03/03/21 03/03/22  Merlyn Lot, MD  pantoprazole (PROTONIX) 40 MG tablet Take 1 tablet (40 mg total) by mouth 2 (two) times daily. 04/05/21 05/05/21  Lucrezia Starch, MD   Physical Exam: Vitals:   05/16/21 1600 05/16/21 1700 05/16/21 1707 05/16/21 1819  BP:  (!) 216/102 (!) 208/116 (!) 202/94  Pulse:   100 94  Resp:  18 18 17   Temp: 98.7 F (37.1 C)     TempSrc:      SpO2:  96% 100% 100%  Weight:      Height:       Constitutional: appears age-appropriate, tobacco odor is present, NAD, calm, comfortable Eyes: PERRL, lids and conjunctivae normal ENMT: Mucous membranes are moist. Posterior pharynx positive for exudate or lesions. Age-appropriate dentition. Hearing appropriate Neck: normal, supple, no masses, no thyromegaly Respiratory: clear to auscultation bilaterally, no  wheezing, no crackles. Normal respiratory effort. No accessory muscle use.  Cardiovascular: Regular rate and rhythm, no murmurs / rubs / gallops. No extremity edema. 2+ pedal pulses. No carotid bruits.  Abdomen: no tenderness, no masses palpated, no hepatosplenomegaly. Bowel sounds positive.  Musculoskeletal: no clubbing / cyanosis.  Right TMA; left BKA. Good ROM, no contractures, no atrophy. Normal muscle tone.  Skin: Lesions are present      Neurologic: Sensation intact. Strength 5/5 in all 4.  Psychiatric: Normal judgment and insight. Alert and oriented x 3. Normal mood.   EKG: independently reviewed, showing sinus rhythm with rate of 95, QTc 449  Chest x-ray on Admission: I personally reviewed and I agree with radiologist reading as below.  CT ABDOMEN PELVIS W CONTRAST  Result Date: 05/16/2021 CLINICAL DATA:  Right lower quadrant abdominal pain. EXAM: CT ABDOMEN AND PELVIS WITH CONTRAST TECHNIQUE: Multidetector CT imaging of the abdomen and pelvis was performed using the standard protocol following bolus administration of intravenous contrast. CONTRAST:  13mL OMNIPAQUE IOHEXOL 300 MG/ML  SOLN COMPARISON:  CT October 23, 2019 FINDINGS: Lower chest: No acute abnormality. Hepatobiliary: No suspicious hepatic lesion. Gallbladder is unremarkable. No biliary ductal dilation. Pancreas: No pancreatic ductal  dilation or evidence of acute inflammation. Spleen: Within normal limits. Adrenals/Urinary Tract: Bilateral adrenal glands are unremarkable. No hydronephrosis. No solid enhancing renal mass. 3 cm left lower pole renal cyst. Right renal scarring. Symmetric enhancement of the bilateral kidneys. Mild wall thickening of an incompletely distended urinary bladder. Stomach/Bowel: No enteric contrast was administered. Small hiatal hernia otherwise the stomach is unremarkable for degree of distension. No pathologic dilation of small or large bowel. Fluid-filled appendix visualized in the right lower quadrant  measuring measuring upper limits of normal at 7 mm without wall thickening or inflammatory stranding. No evidence of acute bowel inflammation. Vascular/Lymphatic: Aortic and branch vessel atherosclerosis without abdominal aortic aneurysm. No pathologically enlarged abdominal or pelvic lymph nodes. Reproductive: Prostate gland is unremarkable. Similar penile calcifications. Other: No pneumoperitoneum. No significant abdominopelvic ascites. Prior left inguinal hernia repair. Musculoskeletal: Multilevel degenerative changes spine. No acute osseous abnormality. IMPRESSION: 1. Fluid-filled appendix measuring upper limits of normal at 7 mm without wall thickening or inflammatory stranding. Findings are equivocal for early acute appendicitis. 2. Mild wall thickening of an incompletely distended urinary bladder. Correlate with urinalysis to exclude cystitis. 3. Aortic Atherosclerosis (ICD10-I70.0). Electronically Signed   By: Dahlia Bailiff M.D.   On: 05/16/2021 16:51   DG Chest Portable 1 View  Result Date: 05/16/2021 CLINICAL DATA:  Rule out pneumonia. Sore throat and fever for 3 days. EXAM: PORTABLE CHEST 1 VIEW COMPARISON:  October 01, 2013 FINDINGS: There is a vascular stent in the superior mediastinum to the left. The heart, hila, and mediastinum are otherwise unchanged and unremarkable. No pneumothorax. No nodules or masses. No focal infiltrates. Mild atelectasis in the left base. IMPRESSION: No cause for the patient's symptoms identified. No pneumonia identified. Electronically Signed   By: Dorise Bullion III M.D.   On: 05/16/2021 15:40    Labs on Admission: I have personally reviewed following labs  CBC: Recent Labs  Lab 05/16/21 1523  WBC 14.3*  NEUTROABS 11.7*  HGB 13.4  HCT 39.0  MCV 95.6  PLT 99991111   Basic Metabolic Panel: Recent Labs  Lab 05/16/21 1523  NA 131*  K 3.6  CL 93*  CO2 29  GLUCOSE 560*  BUN 13  CREATININE 1.10  CALCIUM 8.6*   GFR: Estimated Creatinine Clearance: 76.1  mL/min (by C-G formula based on SCr of 1.1 mg/dL).  Liver Function Tests: Recent Labs  Lab 05/16/21 1523  AST 14*  ALT 10  ALKPHOS 122  BILITOT 0.7  PROT 7.0  ALBUMIN 3.2*   Recent Labs  Lab 05/16/21 1523  LIPASE 26   Urine analysis:    Component Value Date/Time   COLORURINE STRAW (A) 05/16/2021 1523   APPEARANCEUR CLEAR (A) 05/16/2021 1523   APPEARANCEUR Clear 10/01/2013 0929   LABSPEC 1.027 05/16/2021 1523   LABSPEC 1.032 10/01/2013 0929   PHURINE 7.0 05/16/2021 1523   GLUCOSEU >=500 (A) 05/16/2021 1523   GLUCOSEU >=500 10/01/2013 0929   HGBUR SMALL (A) 05/16/2021 1523   BILIRUBINUR NEGATIVE 05/16/2021 1523   BILIRUBINUR Negative 10/01/2013 0929   KETONESUR NEGATIVE 05/16/2021 1523   PROTEINUR 100 (A) 05/16/2021 1523   NITRITE NEGATIVE 05/16/2021 1523   LEUKOCYTESUR NEGATIVE 05/16/2021 1523   LEUKOCYTESUR Negative 10/01/2013 0929   CRITICAL CARE Performed by: Briant Cedar Tonica Brasington  Total critical care time: 35 minutes  Critical care time was exclusive of separately billable procedures and treating other patients.  Critical care was necessary to treat or prevent imminent or life-threatening deterioration.  Endocrine crisis; severe sepsis; hypertensive urgency/emergency  Critical care was time spent personally by me on the following activities: development of treatment plan with patient and/or surrogate as well as nursing, discussions with consultants, evaluation of patient's response to treatment, examination of patient, obtaining history from patient or surrogate, ordering and performing treatments and interventions, ordering and review of laboratory studies, ordering and review of radiographic studies, pulse oximetry and re-evaluation of patient's condition.  Dr. Tobie Poet [facility]  If 7PM-7AM, please contact overnight-coverage provider If 7AM-7PM, please contact day coverage provider [email or web address]  05/16/2021, 6:32 PM

## 2021-05-16 NOTE — Progress Notes (Signed)
60 yo DM, CAD, Left AKA  and right TMA seems to have chronic pain issues came in today w multiple complaints Otalgia, fever and pain ( chest pain, abd pain leg pain) . He is on ASA and plavix. Has had drug induced delirium is on antipsychotics. More likely diagnosis is narcotic withdrawal as source of abd pain..... Reviewing his record this has happened before. CT scan is unimpressive; very borderline mildly dilated appendix w/o surrounding inflammatory response ( suspect incidental findings).  We will do formal consult but suspect this is more incidental findings. Still not sure the source of his WBC. Wound vs ear infection vs less likely  appendicitis.

## 2021-05-16 NOTE — ED Notes (Addendum)
Message to MD Mansy, J. And MD Toniann Fail About patient BP and orders for patient palcement

## 2021-05-17 DIAGNOSIS — Z79899 Other long term (current) drug therapy: Secondary | ICD-10-CM | POA: Diagnosis not present

## 2021-05-17 DIAGNOSIS — E876 Hypokalemia: Secondary | ICD-10-CM | POA: Diagnosis not present

## 2021-05-17 DIAGNOSIS — J039 Acute tonsillitis, unspecified: Secondary | ICD-10-CM | POA: Diagnosis present

## 2021-05-17 DIAGNOSIS — I160 Hypertensive urgency: Secondary | ICD-10-CM | POA: Diagnosis present

## 2021-05-17 DIAGNOSIS — I10 Essential (primary) hypertension: Secondary | ICD-10-CM | POA: Diagnosis present

## 2021-05-17 DIAGNOSIS — G8929 Other chronic pain: Secondary | ICD-10-CM | POA: Diagnosis present

## 2021-05-17 DIAGNOSIS — K3580 Unspecified acute appendicitis: Secondary | ICD-10-CM | POA: Diagnosis present

## 2021-05-17 DIAGNOSIS — F29 Unspecified psychosis not due to a substance or known physiological condition: Secondary | ICD-10-CM | POA: Diagnosis present

## 2021-05-17 DIAGNOSIS — R6520 Severe sepsis without septic shock: Secondary | ICD-10-CM | POA: Diagnosis present

## 2021-05-17 DIAGNOSIS — I2510 Atherosclerotic heart disease of native coronary artery without angina pectoris: Secondary | ICD-10-CM | POA: Diagnosis present

## 2021-05-17 DIAGNOSIS — R1084 Generalized abdominal pain: Secondary | ICD-10-CM | POA: Diagnosis not present

## 2021-05-17 DIAGNOSIS — R739 Hyperglycemia, unspecified: Secondary | ICD-10-CM | POA: Diagnosis present

## 2021-05-17 DIAGNOSIS — F17210 Nicotine dependence, cigarettes, uncomplicated: Secondary | ICD-10-CM | POA: Diagnosis present

## 2021-05-17 DIAGNOSIS — Z91199 Patient's noncompliance with other medical treatment and regimen due to unspecified reason: Secondary | ICD-10-CM | POA: Diagnosis not present

## 2021-05-17 DIAGNOSIS — Z5900 Homelessness unspecified: Secondary | ICD-10-CM | POA: Diagnosis not present

## 2021-05-17 DIAGNOSIS — R1031 Right lower quadrant pain: Secondary | ICD-10-CM | POA: Diagnosis not present

## 2021-05-17 DIAGNOSIS — Z7982 Long term (current) use of aspirin: Secondary | ICD-10-CM | POA: Diagnosis not present

## 2021-05-17 DIAGNOSIS — L03115 Cellulitis of right lower limb: Secondary | ICD-10-CM | POA: Diagnosis present

## 2021-05-17 DIAGNOSIS — Z89612 Acquired absence of left leg above knee: Secondary | ICD-10-CM | POA: Diagnosis not present

## 2021-05-17 DIAGNOSIS — E1165 Type 2 diabetes mellitus with hyperglycemia: Secondary | ICD-10-CM | POA: Diagnosis present

## 2021-05-17 DIAGNOSIS — E1143 Type 2 diabetes mellitus with diabetic autonomic (poly)neuropathy: Secondary | ICD-10-CM | POA: Diagnosis present

## 2021-05-17 DIAGNOSIS — Z23 Encounter for immunization: Secondary | ICD-10-CM | POA: Diagnosis not present

## 2021-05-17 DIAGNOSIS — Z20822 Contact with and (suspected) exposure to covid-19: Secondary | ICD-10-CM | POA: Diagnosis present

## 2021-05-17 DIAGNOSIS — E1169 Type 2 diabetes mellitus with other specified complication: Secondary | ICD-10-CM | POA: Diagnosis not present

## 2021-05-17 DIAGNOSIS — Z794 Long term (current) use of insulin: Secondary | ICD-10-CM | POA: Diagnosis not present

## 2021-05-17 DIAGNOSIS — E1151 Type 2 diabetes mellitus with diabetic peripheral angiopathy without gangrene: Secondary | ICD-10-CM | POA: Diagnosis present

## 2021-05-17 DIAGNOSIS — A419 Sepsis, unspecified organism: Secondary | ICD-10-CM | POA: Diagnosis present

## 2021-05-17 DIAGNOSIS — Z955 Presence of coronary angioplasty implant and graft: Secondary | ICD-10-CM | POA: Diagnosis not present

## 2021-05-17 LAB — COMPREHENSIVE METABOLIC PANEL
ALT: 9 U/L (ref 0–44)
AST: 10 U/L — ABNORMAL LOW (ref 15–41)
Albumin: 2.5 g/dL — ABNORMAL LOW (ref 3.5–5.0)
Alkaline Phosphatase: 92 U/L (ref 38–126)
Anion gap: 8 (ref 5–15)
BUN: 15 mg/dL (ref 6–20)
CO2: 29 mmol/L (ref 22–32)
Calcium: 8.1 mg/dL — ABNORMAL LOW (ref 8.9–10.3)
Chloride: 100 mmol/L (ref 98–111)
Creatinine, Ser: 1.11 mg/dL (ref 0.61–1.24)
GFR, Estimated: >60 mL/min (ref >60)
Glucose, Bld: 242 mg/dL — ABNORMAL HIGH (ref 70–99)
Potassium: 3 mmol/L — ABNORMAL LOW (ref 3.5–5.1)
Sodium: 137 mmol/L (ref 135–145)
Total Bilirubin: 0.4 mg/dL (ref 0.3–1.2)
Total Protein: 5.8 g/dL — ABNORMAL LOW (ref 6.5–8.1)

## 2021-05-17 LAB — CBC
HCT: 34.4 % — ABNORMAL LOW (ref 39.0–52.0)
Hemoglobin: 11.4 g/dL — ABNORMAL LOW (ref 13.0–17.0)
MCH: 31.8 pg (ref 26.0–34.0)
MCHC: 33.1 g/dL (ref 30.0–36.0)
MCV: 95.8 fL (ref 80.0–100.0)
Platelets: 261 10*3/uL (ref 150–400)
RBC: 3.59 MIL/uL — ABNORMAL LOW (ref 4.22–5.81)
RDW: 12 % (ref 11.5–15.5)
WBC: 9.2 10*3/uL (ref 4.0–10.5)
nRBC: 0 % (ref 0.0–0.2)

## 2021-05-17 LAB — PROCALCITONIN: Procalcitonin: <0.1 ng/mL

## 2021-05-17 LAB — GLUCOSE, CAPILLARY
Glucose-Capillary: 115 mg/dL — ABNORMAL HIGH (ref 70–99)
Glucose-Capillary: 211 mg/dL — ABNORMAL HIGH (ref 70–99)
Glucose-Capillary: 228 mg/dL — ABNORMAL HIGH (ref 70–99)
Glucose-Capillary: 229 mg/dL — ABNORMAL HIGH (ref 70–99)
Glucose-Capillary: 346 mg/dL — ABNORMAL HIGH (ref 70–99)

## 2021-05-17 LAB — HIV ANTIBODY (ROUTINE TESTING W REFLEX): HIV Screen 4th Generation wRfx: NONREACTIVE

## 2021-05-17 MED ORDER — GABAPENTIN 400 MG PO CAPS
800.0000 mg | ORAL_CAPSULE | Freq: Three times a day (TID) | ORAL | Status: DC
  Administered 2021-05-17 – 2021-05-20 (×9): 800 mg via ORAL
  Filled 2021-05-17 (×9): qty 2

## 2021-05-17 MED ORDER — POTASSIUM CHLORIDE CRYS ER 20 MEQ PO TBCR
40.0000 meq | EXTENDED_RELEASE_TABLET | Freq: Two times a day (BID) | ORAL | Status: AC
  Administered 2021-05-17 (×2): 40 meq via ORAL
  Filled 2021-05-17 (×2): qty 2

## 2021-05-17 MED ORDER — OXYCODONE-ACETAMINOPHEN 5-325 MG PO TABS
1.0000 | ORAL_TABLET | Freq: Four times a day (QID) | ORAL | Status: DC | PRN
  Administered 2021-05-17 – 2021-05-18 (×6): 2 via ORAL
  Filled 2021-05-17 (×6): qty 2

## 2021-05-17 MED ORDER — PNEUMOCOCCAL VAC POLYVALENT 25 MCG/0.5ML IJ INJ: 0.5000 mL | INJECTION | INTRAMUSCULAR | Status: DC | PRN

## 2021-05-17 MED ORDER — LACTATED RINGERS IV SOLN: INTRAVENOUS | Status: DC

## 2021-05-17 MED ORDER — PHENOL 1.4 % MT LIQD
1.0000 | OROMUCOSAL | Status: DC | PRN
  Administered 2021-05-17: 1 via OROMUCOSAL
  Filled 2021-05-17: qty 177

## 2021-05-17 MED ORDER — CARBAMIDE PEROXIDE 6.5 % OT SOLN
5.0000 [drp] | Freq: Two times a day (BID) | OTIC | Status: DC
  Administered 2021-05-17 – 2021-05-20 (×6): 5 [drp] via OTIC
  Filled 2021-05-17: qty 15

## 2021-05-17 MED ORDER — CEFAZOLIN SODIUM-DEXTROSE 2-4 GM/100ML-% IV SOLN
2.0000 g | Freq: Three times a day (TID) | INTRAVENOUS | Status: DC
  Administered 2021-05-17 – 2021-05-19 (×6): 2 g via INTRAVENOUS
  Filled 2021-05-17 (×6): qty 100

## 2021-05-17 MED ORDER — SILVER SULFADIAZINE 1 % EX CREA
TOPICAL_CREAM | Freq: Two times a day (BID) | CUTANEOUS | Status: DC
  Filled 2021-05-17: qty 85

## 2021-05-17 MED ORDER — INFLUENZA VAC SPLIT QUAD 0.5 ML IM SUSY: 0.5000 mL | PREFILLED_SYRINGE | INTRAMUSCULAR | Status: DC | PRN

## 2021-05-17 NOTE — Progress Notes (Signed)
PHARMACY - PHYSICIAN COMMUNICATION CRITICAL VALUE ALERT - BLOOD CULTURE IDENTIFICATION (BCID)  Samuel [name] is an 60 y.o. male who presented to [facility] on 05/16/2021 with a chief complaint of fever at home with ear pain, sore throat.  His prosthesis for L AKA  broke and crawling on floor, has wound on R stump/knee, ? cellulitis  Assessment:  Blood cultures from  11/6 with GPC in 1 bottle of each set, BCID = MSSE. Possible concerns for cellulitis.  Surgery feels appendicitis unlikely.   Name of physician (or Provider) Contacted: Dr Mayford Knife  Current antibiotics: Ceftriaxone and metronidazole  Changes to prescribed antibiotics recommended:  Recommendations accepted by provider. Narrow to cefazolin   Results for orders placed or performed during the hospital encounter of 05/16/21  Blood Culture ID Panel (Reflexed) (Collected: 05/16/2021  6:54 PM)  Result Value Ref Range   Enterococcus faecalis NOT DETECTED NOT DETECTED   Enterococcus Faecium NOT DETECTED NOT DETECTED   Listeria monocytogenes NOT DETECTED NOT DETECTED   Staphylococcus species DETECTED (A) NOT DETECTED   Staphylococcus aureus (BCID) NOT DETECTED NOT DETECTED   Staphylococcus epidermidis DETECTED (A) NOT DETECTED   Staphylococcus lugdunensis NOT DETECTED NOT DETECTED   Streptococcus species NOT DETECTED NOT DETECTED   Streptococcus agalactiae NOT DETECTED NOT DETECTED   Streptococcus pneumoniae NOT DETECTED NOT DETECTED   Streptococcus pyogenes NOT DETECTED NOT DETECTED   A.calcoaceticus-baumannii NOT DETECTED NOT DETECTED   Bacteroides fragilis NOT DETECTED NOT DETECTED   Enterobacterales NOT DETECTED NOT DETECTED   Enterobacter cloacae complex NOT DETECTED NOT DETECTED   Escherichia coli NOT DETECTED NOT DETECTED   Klebsiella aerogenes NOT DETECTED NOT DETECTED   Klebsiella oxytoca NOT DETECTED NOT DETECTED   Klebsiella pneumoniae NOT DETECTED NOT DETECTED   Proteus species NOT DETECTED NOT DETECTED   Salmonella  species NOT DETECTED NOT DETECTED   Serratia marcescens NOT DETECTED NOT DETECTED   Haemophilus influenzae NOT DETECTED NOT DETECTED   Neisseria meningitidis NOT DETECTED NOT DETECTED   Pseudomonas aeruginosa NOT DETECTED NOT DETECTED   Stenotrophomonas maltophilia NOT DETECTED NOT DETECTED   Candida albicans NOT DETECTED NOT DETECTED   Candida auris NOT DETECTED NOT DETECTED   Candida glabrata NOT DETECTED NOT DETECTED   Candida krusei NOT DETECTED NOT DETECTED   Candida parapsilosis NOT DETECTED NOT DETECTED   Candida tropicalis NOT DETECTED NOT DETECTED   Cryptococcus neoformans/gattii NOT DETECTED NOT DETECTED   Methicillin resistance mecA/C NOT DETECTED NOT DETECTED   Juliette Alcide, PharmD, BCPS.   Work Cell: [phone] 05/17/2021 12:58 PM

## 2021-05-17 NOTE — Progress Notes (Signed)
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 Continue on IV flagyl, rocephin. Continue on IVFs. General surg following. Sepsis resolved   Possible appendicitis: as per CT scan but general surg feels this is only an incidental finding. No surgery indicated at this time as per gen surg. Continue on IV cefazolin, flagyl. General surg following and recs apprec  Unlikely bacteremia: blood cx growing stap epi, possible containment. Will repeat blood cxs tomorrow   HTN urgency: urgency resolved but still w/ HTN. Continue on amlodipine, HCTZ   DM2: poorly controlled, HbA1c 14.5 on 03/2021. Continue on glargine, SSI w/ accuchecks   Peripheral neuropathy: continue on home dose of gabapentin   Possible tonsillitis: continue on IV cefazolin . Group A strep neg. Pro-cal <0.10.   RLE wounds: on knee and around TMA site. Wound care consulted. Does not look grossly infected   Hypokalemia: KCl repleated  Tobacco use: nicotine patch to prevent w/drawl. Smoking cessation counseling  Psychiatric imbalance: continue on home dose of zyprexa    DVT prophylaxis: heparin  Code Status: full  Family Communication:  Disposition Plan: likely d/c back home   Level of care: Med-Surg  Status is: Inpatient  Remains inpatient appropriate because: severity of illness      Consultants:  General surg   Procedures:   Antimicrobials: cefazolin, flagyl   Subjective: Pt c/o RLE pain   Objective: Vitals:   05/16/21  2031 05/16/21 2130 05/16/21 2352 05/17/21 0601  BP: (!) 158/80 (!) 156/74 (!) 188/102 (!) 146/82  Pulse:  88 86 87  Resp:  '14 18 20  ' Temp:   98 F (36.7 C) 97.6 F (36.4 C)  TempSrc:   Oral   SpO2:  91% 100% 100%  Weight:      Height:        Intake/Output Summary (Last 24 hours) at 05/17/2021 0754 Last data filed at 05/17/2021 0648 Gross per 24 hour  Intake 2051.99 ml  Output --  Net 2051.99 ml   Filed Weights   05/16/21 1237  Weight: 72.6 kg    Examination:  General exam: Appears calm but uncomfortable  Respiratory system: Clear to auscultation. Respiratory effort normal. Cardiovascular system: S1 & S2 +. No rubs, gallops or clicks.  Gastrointestinal system: Abdomen is nondistended, soft and nontender. Normal bowel sounds heard. Central nervous system: Alert and oriented. Moves all extremities  Psychiatry: Judgement and insight appear normal. Flat mood and affect    Data Reviewed: I have personally reviewed following labs and imaging studies  CBC: Recent Labs  Lab 05/16/21 1523 05/17/21 0611  WBC 14.3* 9.2  NEUTROABS 11.7*  --   HGB 13.4 11.4*  HCT 39.0 34.4*  MCV 95.6 95.8  PLT 319 086   Basic Metabolic Panel: Recent Labs  Lab 05/16/21 1523 05/17/21 0611  NA 131* 137  K 3.6 3.0*  CL 93* 100  CO2 29 29  GLUCOSE 560* 242*  BUN 13 15  CREATININE 1.10 1.11  CALCIUM 8.6* 8.1*   GFR: Estimated Creatinine Clearance: 75.4 mL/min (by C-G formula  based on SCr of 1.11 mg/dL). Liver Function Tests: Recent Labs  Lab 05/16/21 1523 05/17/21 0611  AST 14* 10*  ALT 10 9  ALKPHOS 122 92  BILITOT 0.7 0.4  PROT 7.0 5.8*  ALBUMIN 3.2* 2.5*   Recent Labs  Lab 05/16/21 1523  LIPASE 26   No results for input(s): AMMONIA in the last 168 hours. Coagulation Profile: No results for input(s): INR, PROTIME in the last 168 hours. Cardiac Enzymes: No results for input(s): CKTOTAL, CKMB, CKMBINDEX, TROPONINI in the last 168 hours. BNP (last 3 results) No results  for input(s): PROBNP in the last 8760 hours. HbA1C: No results for input(s): HGBA1C in the last 72 hours. CBG: Recent Labs  Lab 05/16/21 1831 05/17/21 0002  GLUCAP 353* 346*   Lipid Profile: No results for input(s): CHOL, HDL, LDLCALC, TRIG, CHOLHDL, LDLDIRECT in the last 72 hours. Thyroid Function Tests: No results for input(s): TSH, T4TOTAL, FREET4, T3FREE, THYROIDAB in the last 72 hours. Anemia Panel: No results for input(s): VITAMINB12, FOLATE, FERRITIN, TIBC, IRON, RETICCTPCT in the last 72 hours. Sepsis Labs: Recent Labs  Lab 05/16/21 1708 05/16/21 1854  LATICACIDVEN 1.2 1.2    Recent Results (from the past 240 hour(s))  Resp Panel by RT-PCR (Flu A&B, Covid) Nasopharyngeal Swab     Status: None   Collection Time: 05/16/21  2:40 PM   Specimen: Nasopharyngeal Swab; Nasopharyngeal(NP) swabs in vial transport medium  Result Value Ref Range Status   SARS Coronavirus 2 by RT PCR NEGATIVE NEGATIVE Final    Comment: (NOTE) SARS-CoV-2 target nucleic acids are NOT DETECTED.  The SARS-CoV-2 RNA is generally detectable in upper respiratory specimens during the acute phase of infection. The lowest concentration of SARS-CoV-2 viral copies this assay can detect is 138 copies/mL. A negative result does not preclude SARS-Cov-2 infection and should not be used as the sole basis for treatment or other patient management decisions. A negative result may occur with  improper specimen collection/handling, submission of specimen other than nasopharyngeal swab, presence of viral mutation(s) within the areas targeted by this assay, and inadequate number of viral copies(<138 copies/mL). A negative result must be combined with clinical observations, patient history, and epidemiological information. The expected result is Negative.  Fact Sheet for Patients:  EntrepreneurPulse.com.au  Fact Sheet for Healthcare Providers:  IncredibleEmployment.be  This  test is no t yet approved or cleared by the [place] FDA and  has been authorized for detection and/or diagnosis of SARS-CoV-2 by FDA under an Emergency Use Authorization (EUA). This EUA will remain  in effect (meaning this test can be used) for the duration of the COVID-19 declaration under Section 564(b)(1) of the Act, 21 U.S.C.section 360bbb-3(b)(1), unless the authorization is terminated  or revoked sooner.       Influenza A by PCR NEGATIVE NEGATIVE Final   Influenza B by PCR NEGATIVE NEGATIVE Final    Comment: (NOTE) The Xpert Xpress SARS-CoV-2/FLU/RSV plus assay is intended as an aid in the diagnosis of influenza from Nasopharyngeal swab specimens and should not be used as a sole basis for treatment. Nasal washings and aspirates are unacceptable for Xpert Xpress SARS-CoV-2/FLU/RSV testing.  Fact Sheet for Patients: EntrepreneurPulse.com.au  Fact Sheet for Healthcare Providers: IncredibleEmployment.be  This test is not yet approved or cleared by the [place] FDA and has been authorized for detection and/or diagnosis of SARS-CoV-2 by FDA under an Emergency Use Authorization (EUA). This EUA will remain in effect (meaning this test can be used) for the duration of the  COVID-19 declaration under Section 564(b)(1) of the Act, 21 U.S.C. section 360bbb-3(b)(1), unless the authorization is terminated or revoked.  Performed at [facility], [address] [place], [place] [address]   Group A Strep by PCR     Status: None   Collection Time: 05/16/21  6:13 PM   Specimen: Throat; Sterile Swab  Result Value Ref Range Status   Group A Strep by PCR NOT DETECTED NOT DETECTED Final    Comment: Performed at [facility], [address], [place], [place] [address]         Radiology Studies: CT ABDOMEN PELVIS W CONTRAST  Result Date: 05/16/2021 CLINICAL DATA:  Right lower quadrant abdominal pain. EXAM: CT ABDOMEN  AND PELVIS WITH CONTRAST TECHNIQUE: Multidetector CT imaging of the abdomen and pelvis was performed using the standard protocol following bolus administration of intravenous contrast. CONTRAST:  112m OMNIPAQUE IOHEXOL 300 MG/ML  SOLN COMPARISON:  CT October 23, 2019 FINDINGS: Lower chest: No acute abnormality. Hepatobiliary: No suspicious hepatic lesion. Gallbladder is unremarkable. No biliary ductal dilation. Pancreas: No pancreatic ductal dilation or evidence of acute inflammation. Spleen: Within normal limits. Adrenals/Urinary Tract: Bilateral adrenal glands are unremarkable. No hydronephrosis. No solid enhancing renal mass. 3 cm left lower pole renal cyst. Right renal scarring. Symmetric enhancement of the bilateral kidneys. Mild wall thickening of an incompletely distended urinary bladder. Stomach/Bowel: No enteric contrast was administered. Small hiatal hernia otherwise the stomach is unremarkable for degree of distension. No pathologic dilation of small or large bowel. Fluid-filled appendix visualized in the right lower quadrant measuring measuring upper limits of normal at 7 mm without wall thickening or inflammatory stranding. No evidence of acute bowel inflammation. Vascular/Lymphatic: Aortic and branch vessel atherosclerosis without abdominal aortic aneurysm. No pathologically enlarged abdominal or pelvic lymph nodes. Reproductive: Prostate gland is unremarkable. Similar penile calcifications. Other: No pneumoperitoneum. No significant abdominopelvic ascites. Prior left inguinal hernia repair. Musculoskeletal: Multilevel degenerative changes spine. No acute osseous abnormality. IMPRESSION: 1. Fluid-filled appendix measuring upper limits of normal at 7 mm without wall thickening or inflammatory stranding. Findings are equivocal for early acute appendicitis. 2. Mild wall thickening of an incompletely distended urinary bladder. Correlate with urinalysis to exclude cystitis. 3. Aortic Atherosclerosis  (ICD10-I70.0). Electronically Signed   By: JDahlia BailiffM.D.   On: 05/16/2021 16:51   DG Chest Portable 1 View  Result Date: 05/16/2021 CLINICAL DATA:  Rule out pneumonia. Sore throat and fever for 3 days. EXAM: PORTABLE CHEST 1 VIEW COMPARISON:  October 01, 2013 FINDINGS: There is a vascular stent in the superior mediastinum to the left. The heart, hila, and mediastinum are otherwise unchanged and unremarkable. No pneumothorax. No nodules or masses. No focal infiltrates. Mild atelectasis in the left base. IMPRESSION: No cause for the patient's symptoms identified. No pneumonia identified. Electronically Signed   By: DDorise BullionIII M.D.   On: 05/16/2021 15:40        Scheduled Meds:  amLODipine  10 mg Oral Daily   aspirin EC  81 mg Oral Daily   clopidogrel  75 mg Oral Daily   gabapentin  300 mg Oral BID   heparin  5,000 Units Subcutaneous Q8H   hydrochlorothiazide  25 mg Oral Daily   insulin aspart  0-15 Units Subcutaneous TID WC   insulin aspart  0-5 Units Subcutaneous QHS   insulin detemir  20 Units Subcutaneous BID   levETIRAcetam  500 mg Oral Daily   OLANZapine  5 mg Oral QHS   pantoprazole  40 mg Oral BID   potassium chloride  40 mEq Oral BID   Continuous Infusions:  cefTRIAXone (ROCEPHIN)  IV     lactated ringers 150 mL/hr at 05/17/21 0557   metronidazole 500 mg (05/17/21 0559)     LOS: 0 days    Time spent: 34 mins     Wyvonnia Dusky, MD [facility] Pager [phone]  If 7PM-7AM, please contact night-coverage 05/17/2021, 7:54 AM

## 2021-05-17 NOTE — Consult Note (Signed)
Patient ID: Samuel [name], male   DOB: [birth year], 60 y.o.   MRN: [id number]  HPI Samuel [name] is a 60 y.o. male seen in consultation at the request of Dr. Roxan Hockey.  He came in yesterday with nonspecific symptoms.  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 He was also c/o chest pain and SOB.  Then endorsed some abdominal pain and nausea.  He does have significant social issues and is currently his left prosthesis broke and currently is in a wheelchair.  He does have chronic pain and takes Percocets daily. Of note he complaining of nausea and vomiting but this morning he ate his full breakfast to include pancakes with syrup, as well as milk, sausages and a biscuit.  This morning his breakfast tray was completely empty. He did have CT scan that have personally reviewed showing evidence of a very mild dilated appendix without an appendicolith and there is no evidence of inflammatory changes no perforation and no abscess. He does have a history of coronary artery disease, peripheral vascular disease and uncontrolled diabetes Did have a white count initially yesterday of 14,000 but this morning has normalized and is 9.3.  Rest of his CBC is normal.  CMP from yesterday did show sugars of 560 and that has improved to 242 today.  Creatinine is normal.  HPI  Past Medical History:  Diagnosis Date   Diabetes mellitus without complication (HCC)    Hypertension    UTI (urinary tract infection)     Past Surgical History:  Procedure Laterality Date   CORONARY ANGIOPLASTY WITH STENT PLACEMENT     CYSTOSCOPY/URETEROSCOPY/HOLMIUM LASER/STENT PLACEMENT Right 10/20/2019   Procedure: CYSTOSCOPY/URETEROSCOPY/HOLMIUM LASER/STENT PLACEMENT;  Surgeon: [name], MD;  Location: [facility] ORS;  Service: Urology;  Laterality: Right;   ESOPHAGOGASTRODUODENOSCOPY N/A 10/21/2019   Procedure: ESOPHAGOGASTRODUODENOSCOPY (EGD);  Surgeon: Toney Reil, MD;  Location: [facility] END[facility]COPY;  Service: Gastroenterology;  Laterality: N/A;   FOOT AMPUTATION     HERNIA REPAIR     INCISION AND DRAINAGE PERIRECTAL ABSCESS N/A 09/20/2019   Procedure: IRRIGATION AND DEBRIDEMENT PERIRECTAL ABSCESS;  Surgeon: Carolan Shiver, MD;  Location: [facility] ORS;  Service: General;  Laterality: N/A;    History reviewed. No pertinent family history.  Social History Social History   Tobacco Use   Smoking status: Former   Smokeless tobacco: Never  Substance Use Topics   Alcohol use: Not Currently    Allergies  Allergen Reactions   Fish Allergy Swelling    Current Facility-Administered Medications  Medication Dose Route Frequency Provider Last Rate Last Admin   acetaminophen (TYLENOL) tablet 650 mg  650 mg Oral Q6H PRN Cox, Amy N, DO       Or   acetaminophen (TYLENOL) suppository 650 mg  650 mg Rectal Q6H PRN Cox, Amy N, DO       amLODipine (NORVASC) tablet 10 mg  10 mg Oral Daily Cox, Amy N, DO   10 mg at 05/17/21 0909   aspirin EC tablet 81 mg  81 mg Oral Daily Cox, Amy N, DO   81 mg at 05/17/21 0910   cefTRIAXone (ROCEPHIN) 2 g in sodium chloride 0.9 % 100 mL IVPB  2 g Intravenous Q24H Cox, Amy N, DO       clopidogrel (PLAVIX) tablet 75 mg  75 mg Oral Daily Cox, Amy N, DO   75 mg at 05/17/21 0908   gabapentin (NEURONTIN) capsule 800 mg  800 mg Oral TID Charise Killian, MD       heparin injection 5,000 Units  5,000 Units Subcutaneous Q8H Cox, Amy N, DO   5,000 Units at 05/17/21 0559   hydrochlorothiazide (HYDRODIURIL) tablet 25 mg  25 mg Oral Daily Cox, Amy N, DO   25 mg at 05/17/21 0910   influenza vac split quadrivalent PF (FLUARIX) injection 0.5 mL  0.5 mL Intramuscular Prior to discharge Cox, Amy N, DO       insulin aspart (novoLOG) injection 0-15 Units  0-15 Units Subcutaneous TID WC Cox, Amy N, DO   5 Units at 05/17/21 0910   insulin aspart (novoLOG) injection 0-5 Units  0-5 Units Subcutaneous QHS Cox, Amy N, DO   4 Units at 05/17/21 0026    insulin detemir (LEVEMIR) injection 20 Units  20 Units Subcutaneous BID Cox, Amy N, DO   20 Units at 05/16/21 1844   labetalol (NORMODYNE) injection 10 mg  10 mg Intravenous Q2H PRN Eduard Clos, MD   10 mg at 05/16/21 2121   lactated ringers infusion   Intravenous Continuous Charise Killian, MD 75 mL/hr at 05/17/21 0908 New Bag at 05/17/21 0908   levETIRAcetam (KEPPRA) tablet 500 mg  500 mg Oral Daily Cox, Amy N, DO       menthol-cetylpyridinium (CEPACOL) lozenge 3 mg  1 lozenge Oral PRN Cox, Amy N, DO       metroNIDAZOLE (FLAGYL) IVPB 500 mg  500 mg Intravenous Q12H Cox, Amy N, DO 100 mL/hr at 05/17/21 0559 500 mg at 05/17/21 0559   morphine 2 MG/ML injection 2 mg  2 mg Intravenous Q3H PRN Cox, Amy N, DO   2 mg at 05/17/21 0910   nicotine (NICODERM CQ - dosed in mg/24 hours) patch 21 mg  21 mg Transdermal Daily PRN Cox, Amy N, DO       OLANZapine (ZYPREXA) tablet 5 mg  5 mg Oral QHS Cox, Amy N, DO   5 mg at 05/17/21 0028   ondansetron (ZOFRAN) tablet 4 mg  4 mg Oral Q6H PRN Cox, Amy N, DO       Or   ondansetron (ZOFRAN) injection 4 mg  4 mg Intravenous Q6H PRN Cox, Amy N, DO   4 mg at 05/16/21 1947   oxyCODONE-acetaminophen (PERCOCET/ROXICET) 5-325 MG per tablet 1-2 tablet  1-2 tablet Oral Q6H PRN Eduard Clos, MD   2 tablet at 05/17/21 0028   pantoprazole (PROTONIX) EC tablet 40 mg  40 mg Oral BID Cox, Amy N, DO   40 mg at 05/17/21 0909   phenol (CHLORASEPTIC) mouth spray 1 spray  1 spray Mouth/Throat PRN Charise Killian, MD       pneumococcal 23 valent vaccine (PNEUMOVAX-23) injection 0.5 mL  0.5 mL Intramuscular Prior to discharge Cox, Amy N, DO       potassium chloride SA (KLOR-CON) CR tablet 40 mEq  40 mEq Oral BID Charise Killian, MD   40 mEq at 05/17/21 0908   silver sulfADIAZINE (SILVADENE) 1 % cream   Topical BID Charise Killian, MD         Review of Systems Full ROS  was asked and was negative except for the information on the HPI  Physical  Exam Blood pressure (!) 145/78, pulse 88, temperature (!) 97.4 F (36.3 C), temperature source Oral, resp. rate 16, height 5\' 10"  (1.778 m), weight 72.6 kg, SpO2 100 %. CONSTITUTIONAL: NAD. EYES: Pupils are equal, round,t, Sclera are  non-icteric. EARS, NOSE, MOUTH AND THROAT: He is wearing a maks Hearing is intact to voice. LYMPH NODES:  Lymph nodes in the neck are normal. RESPIRATORY:  Lungs are clear. There is normal respiratory effort, with equal breath sounds bilaterally, and without pathologic use of accessory muscles. CARDIOVASCULAR: Heart is regular without murmurs, gallops, or rubs. GI: The abdomen is  soft,  and nondistended.  Is evidence of diffuse tenderness to palpation.  He hurts everywhere in his abdominal wall also hears in his chest and also bilateral inguinal regions.  No peritonitis no rebound There are no palpable masses. There is no hepatosplenomegaly. There are normal bowel sounds GU: Rectal deferred.   MUSCULOSKELETAL: Normal muscle strength and tone. No cyanosis or edema.  Does have a well-healed stump on the left below the knee amputation and a right TMA with some eschar SKIN: Turgor is good and there are no pathologic skin lesions or ulcers. NEUROLOGIC: Motor and sensation is grossly normal. Cranial nerves are grossly intact. PSYCH:  Oriented to person, place and time. Affect is normal.  Data Reviewed  I have personally reviewed the patient's imaging, laboratory findings and medical records.    Assessment/Plan 60 year old male with multiple comorbidities and chronic pain presents with nonspecific symptoms including otalgia, leg pain and abdominal pain.  CT scan showing very mildly dilated appendix without evidence of appendicitis there is no evidence of appendicolith.  Clinical picture definitely not consistent with appendicitis. I do think there is a low yield on surgical intervention at this time.  Discussed with the patient in detail about my thought process.  We  will continue to follow him.  If he persists symptoms we may have to repeat a CT on Wednesday or so. He does have significant social and chronic pain issues and he pretty much hurts everywhere. I do not have any thing to offer at this point time from a surgical perspective but we will make sure that we will continue to follow him   Time spent with the patient was 70 minutes, with more than 50% of the time spent in face-to-face education, counseling and care coordination.     Sterling Big, MD FACS General Surgeon 05/17/2021, 10:36 AM

## 2021-05-17 NOTE — Progress Notes (Signed)
Inpatient Diabetes Program Recommendations  AACE/ADA: New Consensus Statement on Inpatient Glycemic Control (2015)  Target Ranges:  Prepandial:   less than 140 mg/dL      Peak postprandial:   less than 180 mg/dL (1-2 hours)      Critically ill patients:  140 - 180 mg/dL   Results for Samuel [name], Samuel [name] (MRN [id number]) as of 05/17/2021 07:44  Ref. Range 05/16/2021 18:31 05/17/2021 00:02  Glucose-Capillary Latest Ref Range: 70 - 99 mg/dL 353 (H)  20 units Levemir 346 (H)  4 units Novolog   Results for Samuel [name], Samuel [name] (MRN [id number]) as of 05/17/2021 11:50  Ref. Range 05/17/2021 08:30 05/17/2021 11:47  Glucose-Capillary Latest Ref Range: 70 - 99 mg/dL 228 (H)  5 units Novolog  229 (H)  20 units Levemir  Results for Samuel [name], Samuel [name] (MRN [id number]) as of 05/17/2021 07:44  Ref. Range 10/19/2019 07:43 04/05/2021 13:15  Hemoglobin A1C Latest Ref Range: 4.8 - 5.6 % >15.5 (H) 14.5 (H)  (369 mg/dl)    Admit with:  Early acute uncomplicated appendicitis Bilateral tonsillitis with erythema and mild edema  History: DM2, L AKA  Home DM Meds: Levemir 20 units BID        Current Orders: Novolog Moderate Correction Scale/ SSI (0-15 units) TID AC + HS     Levemir 20 units BID   MD- Pt stated to me he takes Lantus 20 units BID + Humalog SSI at home.  Note CBG 229 at 12pm today.  Please consider starting low dose Novolog Meal Coverage: Novolog 3 units TID with meals     Met w/ pt at bedside this AM.  Pt stated to me that he takes Lantus 20 units BID + Humalog TID at home.  Does not take Food coverage b/c he states he only gets 1 good meal per day--Takes Humalog on a scale--could not tell me the specifics of his scale but told me he would take 6 units Humalog for CBG of 300 at home.  Perhaps pt is dosing 1 unit for every 50 mg/dl above target CBG of 100??    Pt told me he hs many aches and pains right now and does not like how we are dosing his insulin.  Complained that we gave him 5 units  Novolog this AM and told me when his CBG nears 100 mg/dl that he has Hypoglycemia symptoms--Discussed with pt that he likely has HYPO symptoms when his CBGs are 100 or less b/c his A1c is high and indicates his CBGs have been maintaining in the mid 300 range.  Discussed with pt that if we can  help him get his CBGs in normal healthy ranges more consistently that he will likely feel more normal with healthy CBGs.  Pt told me he has a 1 year supply of both insulins at home--stated he is moving to a boarding house and will be able to properly store his insulin there.  Also stated to me that he had a referral to an ENDO for today but couldn't remember the name of the ENDO--wants to see an ENDO in [place].  Discussed with pt that he should reach out to the MD who made the referral to the ENDO and have them set up a new appt.  Spoke with patient about his current A1c of 14.5%.  Explained what an A1c is and what it measures.  Reminded patient that his goal A1c is 7% or less per ADA standards to prevent both acute and long-term complications.  Explained to patient  the extreme importance of good glucose control at home.  Encouraged patient to check his CBGs at least TID AC at home and to record all CBGs in a logbook for his PCP or Endocrinologist to review.    --Will follow patient during hospitalization--  Wyn Quaker RN, MSN, CDE Diabetes Coordinator Inpatient Glycemic Control Team Team Pager: [phone] (8a-5p)

## 2021-05-17 NOTE — Consult Note (Signed)
WOC Nurse Consult Note: Patient receiving care in [facility] Reason for Consult: right knee and foot wounds Wound type: full thickness. Patient has been crawling on his hands and knees resulting in the traumatic wounds. Pressure Injury POA: Yes/No/NA Measurement: To be provided by the bedside RN in the flowsheet section  Wound bed: see photos in the EMR Drainage (amount, consistency, odor) none Periwound: intact Dressing procedure/placement/frequency: BID application of silvadene: Apply to right knee and foot wounds AFTER cleansing with soap and water and patting dry. Then place dry gauze over the silvadene and tape in place.  Monitor the wound area(s) for worsening of condition such as: Signs/symptoms of infection,  Increase in size,  Development of or worsening of odor, Development of pain, or increased pain at the affected locations.  Notify the medical team if any of these develop.  Thank you for the consult.WOC nurse will not follow at this time.  Please re-consult the WOC team if needed.  Helmut Muster, RN, MSN, CWOCN, CNS-BC, pager [phone]

## 2021-05-18 DIAGNOSIS — R1031 Right lower quadrant pain: Secondary | ICD-10-CM

## 2021-05-18 DIAGNOSIS — E1143 Type 2 diabetes mellitus with diabetic autonomic (poly)neuropathy: Secondary | ICD-10-CM | POA: Diagnosis not present

## 2021-05-18 DIAGNOSIS — K3580 Unspecified acute appendicitis: Secondary | ICD-10-CM | POA: Diagnosis not present

## 2021-05-18 DIAGNOSIS — E1165 Type 2 diabetes mellitus with hyperglycemia: Secondary | ICD-10-CM

## 2021-05-18 DIAGNOSIS — I10 Essential (primary) hypertension: Secondary | ICD-10-CM | POA: Diagnosis not present

## 2021-05-18 LAB — BLOOD CULTURE ID PANEL (REFLEXED) - BCID2

## 2021-05-18 LAB — GLUCOSE, CAPILLARY
Glucose-Capillary: 129 mg/dL — ABNORMAL HIGH (ref 70–99)
Glucose-Capillary: 173 mg/dL — ABNORMAL HIGH (ref 70–99)
Glucose-Capillary: 96 mg/dL (ref 70–99)
Glucose-Capillary: 199 mg/dL — ABNORMAL HIGH (ref 70–99)

## 2021-05-18 LAB — BASIC METABOLIC PANEL
Anion gap: 7 (ref 5–15)
BUN: 14 mg/dL (ref 6–20)
CO2: 29 mmol/L (ref 22–32)
Calcium: 8.2 mg/dL — ABNORMAL LOW (ref 8.9–10.3)
Chloride: 101 mmol/L (ref 98–111)
Creatinine, Ser: 1.22 mg/dL (ref 0.61–1.24)
GFR, Estimated: >60 mL/min (ref >60)
Glucose, Bld: 130 mg/dL — ABNORMAL HIGH (ref 70–99)
Potassium: 3.8 mmol/L (ref 3.5–5.1)
Sodium: 137 mmol/L (ref 135–145)

## 2021-05-18 LAB — CBC
HCT: 35.5 % — ABNORMAL LOW (ref 39.0–52.0)
Hemoglobin: 11.8 g/dL — ABNORMAL LOW (ref 13.0–17.0)
MCH: 32.4 pg (ref 26.0–34.0)
MCHC: 33.2 g/dL (ref 30.0–36.0)
MCV: 97.5 fL (ref 80.0–100.0)
Platelets: 285 10*3/uL (ref 150–400)
RBC: 3.64 MIL/uL — ABNORMAL LOW (ref 4.22–5.81)
RDW: 12.3 % (ref 11.5–15.5)
WBC: 9.5 10*3/uL (ref 4.0–10.5)
nRBC: 0 % (ref 0.0–0.2)

## 2021-05-18 MED ORDER — POLYETHYLENE GLYCOL 3350 17 G PO PACK
17.0000 g | PACK | Freq: Every day | ORAL | Status: DC
Start: 2021-05-18 — End: 2021-05-20
  Administered 2021-05-20: 17 g via ORAL
  Filled 2021-05-18 (×2): qty 1

## 2021-05-18 MED ORDER — OXYCODONE-ACETAMINOPHEN 5-325 MG PO TABS
1.0000 | ORAL_TABLET | ORAL | Status: DC | PRN
Start: 2021-05-18 — End: 2021-05-20
  Administered 2021-05-18 – 2021-05-19 (×6): 2 via ORAL
  Administered 2021-05-20: 1 via ORAL
  Administered 2021-05-20: 2 via ORAL
  Administered 2021-05-20: 1 via ORAL
  Administered 2021-05-20: 2 via ORAL
  Filled 2021-05-18 (×3): qty 2
  Filled 2021-05-18: qty 1
  Filled 2021-05-18 (×2): qty 2
  Filled 2021-05-18: qty 1
  Filled 2021-05-18 (×3): qty 2

## 2021-05-18 NOTE — Progress Notes (Addendum)
[facility] SURGICAL PROGRESS NOTE (cpt [phone])  Hospital Day(s): 1.   Interval History: Patient seen and examined, no acute events or new complaints overnight. Patient reports this morning his abdomen overall feels better compared to yesterday and is only reporting left sided and suprapubic soreness. No complaints of RLQ pain at all. He does endorse he feels constipated and states "he hasn't had a bowel movement in 5 days" Previously seems that magnesemia citrate has helped. Otherwise only complains of chronic MSK pain. He has remained without leukocytosis over the last 24-48 hours. WBC 9.5K this morning. Renal function remains at his baseline; sCr - 1.22; UO - 1.8L + unmeasured. No significant electrolyte derangements. He is tolerating a regular diet.   Review of Systems:  Constitutional: denies fever, chills  HEENT: denies cough or congestion  Respiratory: denies any shortness of breath  Cardiovascular: denies chest pain or palpitations  Gastrointestinal: + abdominal pain (Improved, LLQ), denied N/V, or diarrhea/and bowel function as per interval history Genitourinary: denies burning with urination or urinary frequency Musculoskeletal: + pain  Vital signs in last 24 hours: [min-max] current  Temp:  [97.6 F (36.4 C)-98 F (36.7 C)] 97.6 F (36.4 C) (11/08 0759) Pulse Rate:  [80-90] 88 (11/08 0759) Resp:  [16-17] 16 (11/08 0759) BP: (143-180)/(84-99) 173/98 (11/08 0759) SpO2:  [96 %-100 %] 97 % (11/08 0759)     Height: 5\' 10"  (177.8 cm) Weight: 72.6 kg BMI (Calculated): 22.96   Intake/Output last 2 shifts:  11/07 0701 - 11/08 0700 In: 1662.8 [I.V.:1343.6; IV Piggyback:319.2] Out: 1800 [Urine:1800]   Physical Exam:  Constitutional: alert, cooperative and no distress  HENT: normocephalic without obvious abnormality  Eyes: PERRL, EOM's grossly intact and symmetric  Respiratory: breathing non-labored at rest  Cardiovascular: regular rate and sinus rhythm   Gastrointestinal: soft, no significant tenderness, LLQ soreness, non-distended, no rebound/guarding. He is certainly without peritonitis Musculoskeletal: right TMA, left BKA   Labs:  CBC Latest Ref Rng & Units 05/18/2021 05/17/2021 05/16/2021  WBC 4.0 - 10.5 K/uL 9.5 9.2 14.3(H)  Hemoglobin 13.0 - 17.0 g/dL 11.8(L) 11.4(L) 13.4  Hematocrit 39.0 - 52.0 % 35.5(L) 34.4(L) 39.0  Platelets 150 - 400 K/uL 285 261 319   CMP Latest Ref Rng & Units 05/18/2021 05/17/2021 05/16/2021  Glucose 70 - 99 mg/dL 130(H) 242(H) 560(HH)  BUN 6 - 20 mg/dL 14 15 13   Creatinine 0.61 - 1.24 mg/dL 1.22 1.11 1.10  Sodium 135 - 145 mmol/L 137 137 131(L)  Potassium 3.5 - 5.1 mmol/L 3.8 3.0(L) 3.6  Chloride 98 - 111 mmol/L 101 100 93(L)  CO2 22 - 32 mmol/L 29 29 29   Calcium 8.9 - 10.3 mg/dL 8.2(L) 8.1(L) 8.6(L)  Total Protein 6.5 - 8.1 g/dL - 5.8(L) 7.0  Total Bilirubin 0.3 - 1.2 mg/dL - 0.4 0.7  Alkaline Phos 38 - 126 U/L - 92 122  AST 15 - 41 U/L - 10(L) 14(L)  ALT 0 - 44 U/L - 9 10    Imaging studies: No new pertinent imaging studies   Assessment/Plan: (ICD-10's: R10.31) 60 y.o. male admitted with chronic pain, found to have very mildly dilated appendix without inflammatory changes, abscess, nor perforation, now with resolution in abdominal pain and normalization of WBC   - Very low suspicion for appendicitis at this time given resolution in abdominal pain and leukocytosis without significant appendix changes seen on imaging.     - Low yield for surgical interventions  - Okay to continue diet   - Monitor abdominal  examination; on-going bowel function  - Unfortunately magnesium citrate is on nationwide recall; unable to add to bowel regimen  - Pain control prn; antiemetics prn - Further management per primary service; we will be available    All of the above findings and recommendations were discussed with the patient, and the medical team, and all of patient's questions were answered to his expressed  satisfaction.   -- Lynden Oxford, PA-C [facility] 05/18/2021, 8:33 AM [phone] M-F: 7am - 4pm

## 2021-05-18 NOTE — Evaluation (Signed)
Occupational Therapy Evaluation Patient Details Name: Samuel [name] MRN: [id number] DOB: 25-Aug-1960 Today's Date: 05/18/2021   History of Present Illness Samuel [name] is a 60 y.o. male with medical history significant for insulin-dependent diabetes mellitus, hypertension, psychosis, left BKA, right TMA, CAD, has left prosthesis, who presents to the emergency department for chief concerns of hyperglycemic crisis, broken prosthesis and wounds on R LE and bilateral hands, as he has been crawling during the 2 weeks his prosthesis has been broken. He reports that over the last several days he has been having subjective fever, sore throat, cough, belly pain.   Clinical Impression   Samuel [name] presents today with generalized weakness, limited endurance, impaired balance secondary to broken L LE prosthesis, anxiety, and poor health literacy. He reports that he frequently has mod-severe back pain, but that he is pain-free at present, having recently taken pain meds. Pt's prosthesis has been broken for ~ 2 weeks; Samuel [name] states that he has had to crawl within his home (hallways there are not wide enough to accommodate a wheelchair) and to gain access into the home (along a gravel driveway and up/down 8 STE). He displays wounds on knuckles and R LE. Pt demonstrates little understanding as to how he might obtain repair for his prosthesis, having missed a recent appointment to address this issue. He also reports that he has difficulty managing his medications, cannot afford testing strips to monitor his blood glucose levels, and cannot recall the schedule for taking insulin or other medications. He states the he has crutches at home but has not tried these or other DME as an alternative to crawling, as "no one has shown me how to use them." Recommend ongoing OT while patient is hospitalized, followed by outpatient OT post-DC, to address endurance, balance, wound management/prevention, safe use of DME, as well as  assisting patient to successfully address ongoing health concerns     Recommendations for follow up therapy are one component of a multi-disciplinary discharge planning process, led by the attending physician.  Recommendations may be updated based on patient status, additional functional criteria and insurance authorization.   Follow Up Recommendations  Outpatient OT    Assistance Recommended at Discharge Intermittent Supervision/Assistance  Functional Status Assessment  Patient has had a recent decline in their functional status and demonstrates the ability to make significant improvements in function in a reasonable and predictable amount of time.  Equipment Recommendations  Other (comment) (repair/replacement of L LE prosethsis)    Recommendations for Other Services       Precautions / Restrictions Precautions Precautions: Fall Restrictions Weight Bearing Restrictions: No      Mobility Bed Mobility Overal bed mobility: Modified Independent                  Transfers Overall transfer level: Modified independent Equipment used: Pushed w/c               General transfer comment: good UE strength for transfering bed to WC      Balance Overall balance assessment: Modified Independent Sitting-balance support: Bilateral upper extremity supported;Single extremity supported Sitting balance-Leahy Scale: Good                                     ADL either performed or assessed with clinical judgement   ADL Overall ADL's : Needs assistance/impaired  Toilet Transfer: Haematologistupervision/safety   Toileting- Clothing Manipulation and Hygiene: Supervision/safety         General ADL Comments: Generally Mod I with ADLs; however requires cueing for sequencing, continuation, secondary to distraction, inattention     Vision Ability to See in Adequate Light: 1 Impaired Patient Visual Report: Central vision impairment        Perception     Praxis      Pertinent Vitals/Pain Pain Assessment: No/denies pain     Hand Dominance     Extremity/Trunk Assessment Upper Extremity Assessment Upper Extremity Assessment: Overall WFL for tasks assessed   Lower Extremity Assessment Lower Extremity Assessment: LLE deficits/detail;RLE deficits/detail RLE Deficits / Details: R TMA; wounds on R knee and foot RLE Sensation: history of peripheral neuropathy;decreased light touch LLE Deficits / Details: L BKA, limited ROM (~150 degrees) at knee       Communication Communication Communication: No difficulties   Cognition Arousal/Alertness: Awake/alert Behavior During Therapy: Agitated Overall Cognitive Status: Within Functional Limits for tasks assessed                                 General Comments: A&O x 4; however, anxious, agitated, engages in tangential conversion, difficult to redirect, impulsive     General Comments       Exercises Other Exercises Other Exercises: Education re: med mgmt, importance of monitoring/controlling blood glucose, decision-making re: health concerns   Shoulder Instructions      Home Living Family/patient expects to be discharged to:: Private residence Living Arrangements:  (lives in aunt's home, with various other family members) Available Help at Discharge: Available PRN/intermittently;Family Type of Home: Mobile home Home Access: Stairs to enter Entrance Stairs-Number of Steps: 8   Home Layout: One level               Home Equipment: Cane - single point;Wheelchair - Dispensing opticianmanual;Shower seat;Crutches   Additional Comments: Home where he is living at present is not wheelchair accessible -- pt reports he crawl up stairs to get into house. Inside of home is not large enough for maneuvering WC      Prior Functioning/Environment Prior Level of Function : Driving;Independent/Modified Independent             Mobility Comments: Mod I with prosthesis ADLs  Comments: Reports able to drive limited distances, able to perform ADLs INDly.        OT Problem List: Decreased strength;Impaired balance (sitting and/or standing);Decreased knowledge of precautions;Pain;Impaired sensation;Decreased knowledge of use of DME or AE;Decreased coordination;Decreased activity tolerance;Decreased range of motion      OT Treatment/Interventions: Self-care/ADL training;DME and/or AE instruction;Therapeutic activities;Balance training;Therapeutic exercise;Patient/family education    OT Goals(Current goals can be found in the care plan section) Acute Rehab OT Goals Patient Stated Goal: to move into new place (boarding house) OT Goal Formulation: With patient Time For Goal Achievement: 06/01/21 Potential to Achieve Goals: Good ADL Goals Pt Will Perform Tub/Shower Transfer: Shower transfer;with modified independence;shower seat (using LRAD) Additional ADL Goal #1: pt will be able to identify/demostrate 2+ strategies for managing medications Additional ADL Goal #2: Pt will be able to identify/demonstrate 2+ strategies for tracking/completing medical appointments  OT Frequency: Min 1X/week   Barriers to D/C: Inaccessible home environment  current home cannot accommodate wheelchair       Co-evaluation              AM-PAC OT "6 Clicks" Daily Activity  Outcome Measure Help from another person eating meals?: None Help from another person taking care of personal grooming?: A Little Help from another person toileting, which includes using toliet, bedpan, or urinal?: A Little Help from another person bathing (including washing, rinsing, drying)?: A Little Help from another person to put on and taking off regular upper body clothing?: None Help from another person to put on and taking off regular lower body clothing?: None 6 Click Score: 21   End of Session Equipment Utilized During Treatment: Other (comment) (wheelchair)  Activity Tolerance: Patient  tolerated treatment well Patient left: in bed;with call bell/phone within reach;with bed alarm set;with nursing/sitter in room  OT Visit Diagnosis: Unsteadiness on feet (R26.81);Muscle weakness (generalized) (M62.81)                Time: [id number] OT Time Calculation (min): 42 min Charges:  OT General Charges $OT Visit: 1 Visit OT Evaluation $OT Eval Moderate Complexity: 1 Mod OT Treatments $Self Care/Home Management : 38-52 mins Josiah Lobo, PhD, MS, OTR/L 05/18/21, 2:48 PM

## 2021-05-18 NOTE — TOC Progression Note (Addendum)
Transition of Care [facility]) - Progression Note    Patient Details  Name: Samuel [name] MRN: [id number] Date of Birth: 1960/12/02  Transition of Care [facility]) CM/SW Howe, RN Phone Number: 05/18/2021, 10:44 AM  Clinical Narrative:     Met with the patient in the room to discuss DC plan and needs, He stated that he has been living with his aunt but has made a deposit for a room at a boarding hose, He plans to move there.  He has a vehicle and does drive, He is in Need of a PCP, [facility] is pushed out until after the first of the year for new patients, I called [facility] at [phone] to get the next available follow up appointment for PCP, Nov 15th at 145 at [facility] with Dr Glyn Ade , He will need to bring DC paperwork and Insurance card, provided this to the patient He stated that he has a prothesis but it is broken, he stated that it is a starter one and it needs replaced, He got the original from [place] where he recently moved from, I provided the patient with the name address and phone number from 3 places that deal with prosthetics in [place] and explained I can't recommend any of them that this was from a google search, He stated thanks.    He stated that he would not need any PT services and does not feel he will need wound care at this time    Expected Discharge Plan and Services                                                 Social Determinants of Health (SDOH) Interventions    Readmission Risk Interventions No flowsheet data found.

## 2021-05-18 NOTE — Progress Notes (Addendum)
PROGRESS NOTE   HPI was taken from Dr. Tobie Poet: Samuel [name] is a 60 y.o. male with medical history significant for insulin-dependent diabetes mellitus, hypertension, left AKA, right TMA, CAD, has left prosthesis, who presents to the emergency department for chief concerns of broken prosthesis.   The prosthesis was broken about two weeks ago. He had appointment [facility] on November 4th and he was told he did not have the appointment due to no confirmation.   He denies diarrhea.   His nephew/cousin recently had strep throat and he has been staying with his aunt.    He reports that over the last several days he has been having subjective fever, sore throat, cough, belly pain.  He also endorses that he is in order to get around has been crawling around the house.  He originally presented for the wounds due to crawling without a prosthesis.   Social history: He lives at home with his aunt. He endorses current tobacco use. He denies etoh and recreational drug use. He formerly worked in [employer]. He is currently disabled   Vaccination history: He is vaccinated for covid 19, with 2 doses of Moderna.     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 Continue on IV flagyl, rocephin. Continue on IVFs. General surg following. Sepsis resolved   Possible appendicitis: as per CT scan but general surg feels this is only an incidental finding. No surgery indicated at this time as per gen surg. Continue on IV flagyl,  cefazolin. General surg following and recs apprec  Unlikely bacteremia: blood cxs growing staph epi, possible containment. Repeat blood cxs today   HTN urgency: urgency resolved but still w/ HTN. Continue on amlodipine, HCTZ. IV labetalol prn   DM2: poorly controlled, HbA1c 14.5 on 03/2021. Continue on levemir, SSI w/ accuchecks   Peripheral neuropathy: continue on home dose of gabapentin   Possible tonsillitis: continue on IV cefazolin. Pro-cal < 0.10. Group A strep neg.  RLE wounds: on knee and around TMA site. Continue w/ wound care   Hypokalemia: WNL today   Tobacco use: nicotine patch to prevent w/drawl. Received smoking cessation counseling   Psychiatric imbalance: continue on home dose of zyprexa   Multiple social issues: pt's prothesis broke prior to admission sometime and pt needs help getting a new one. Pt lives w/ his aunt whose house is not wheelchair accessible so pt has to crawl around on his hands/knees a lot. Pt needs a PCP, ophthalmologist, & dentist. TOC is aware and helping the pt w/ a few of these issues    DVT prophylaxis: heparin  Code Status: full  Family Communication:  Disposition Plan: likely d/c back home   Level of care: Med-Surg  Status is: Inpatient  Remains inpatient appropriate because: severity of illness, can likely d/c tomorrow if repeat blood cxs show NGTD       Consultants:  General surg   Procedures:   Antimicrobials: cefazolin, flagyl   Subjective: Pt c/o multiple social issues.   Objective: Vitals:   05/17/21 1952 05/17/21 2310  05/18/21 0429 05/18/21 0759  BP: (!) 146/84 (!) 180/99 (!) 143/86 (!) 173/98  Pulse: 81 90 80 88  Resp: '16 17 17 16  ' Temp: 98 F (36.7 C) 97.7 F (36.5 C) 97.8 F (36.6 C) 97.6 F (36.4 C)  TempSrc:      SpO2: 100% 98% 96% 97%  Weight:      Height:        Intake/Output Summary (Last 24 hours) at 05/18/2021 0917 Last data filed at 05/18/2021 0745 Gross per 24 hour  Intake 1792.51 ml   Output 1800 ml  Net -7.49 ml   Filed Weights   05/16/21 1237  Weight: 72.6 kg    Examination:  General exam: Appears calm & comfortable  Respiratory system: clear breath sounds b/l  Cardiovascular system: S1/S2+. No rubs or clicks  Gastrointestinal system: Abd is soft, NT, ND & hypoactive bowel sounds  Central nervous system: Alert and oriented. Moves all extremities  Psychiatry: Judgement and insight appear normal. Flat mood and affect    Data Reviewed: I have personally reviewed following labs and imaging studies  CBC: Recent Labs  Lab 05/16/21 1523 05/17/21 0611 05/18/21 0636  WBC 14.3* 9.2 9.5  NEUTROABS 11.7*  --   --   HGB 13.4 11.4* 11.8*  HCT 39.0 34.4* 35.5*  MCV 95.6 95.8 97.5  PLT 319 261 323   Basic Metabolic Panel: Recent Labs  Lab 05/16/21 1523 05/17/21 0611 05/18/21 0636  NA 131* 137 137  K 3.6 3.0* 3.8  CL 93* 100 101  CO2 '29 29 29  ' GLUCOSE 560* 242* 130*  BUN '13 15 14  ' CREATININE 1.10 1.11 1.22  CALCIUM 8.6* 8.1* 8.2*   GFR: Estimated Creatinine Clearance: 68.6 mL/min (by C-G formula based on SCr of 1.22 mg/dL). Liver Function Tests: Recent Labs  Lab 05/16/21 1523 05/17/21 0611  AST 14* 10*  ALT 10 9  ALKPHOS 122 92  BILITOT 0.7 0.4  PROT 7.0 5.8*  ALBUMIN 3.2* 2.5*   Recent Labs  Lab 05/16/21 1523  LIPASE 26   No results for input(s): AMMONIA in the last 168 hours. Coagulation Profile: No results for input(s): INR, PROTIME in the last 168 hours. Cardiac Enzymes: No results for input(s): CKTOTAL, CKMB, CKMBINDEX, TROPONINI in the last 168 hours. BNP (last 3 results) No results for input(s): PROBNP in the last 8760 hours. HbA1C: No results for input(s): HGBA1C in the last 72 hours. CBG: Recent Labs  Lab 05/17/21 0830 05/17/21 1147 05/17/21 1604 05/17/21 2152 05/18/21 0759  GLUCAP 228* 229* 115* 211* 129*   Lipid Profile: No results for input(s): CHOL, HDL, LDLCALC, TRIG, CHOLHDL, LDLDIRECT in the last 72  hours. Thyroid Function Tests: No results for input(s): TSH, T4TOTAL, FREET4, T3FREE, THYROIDAB in the last 72 hours. Anemia Panel: No results for input(s): VITAMINB12, FOLATE, FERRITIN, TIBC, IRON, RETICCTPCT in the last 72 hours. Sepsis Labs: Recent Labs  Lab 05/16/21 1708 05/16/21 1854 05/17/21 0611  PROCALCITON  --   --  <0.10  LATICACIDVEN 1.2 1.2  --     Recent Results (from the past 240 hour(s))  Resp Panel by RT-PCR (Flu A&B, Covid) Nasopharyngeal Swab     Status: None   Collection Time: 05/16/21  2:40 PM   Specimen: Nasopharyngeal Swab; Nasopharyngeal(NP) swabs in vial transport medium  Result Value Ref Range Status   SARS Coronavirus 2 by RT PCR NEGATIVE NEGATIVE Final    Comment: (NOTE) SARS-CoV-2 target nucleic acids are NOT DETECTED.  The SARS-CoV-2 RNA is generally  detectable in upper respiratory specimens during the acute phase of infection. The lowest concentration of SARS-CoV-2 viral copies this assay can detect is 138 copies/mL. A negative result does not preclude SARS-Cov-2 infection and should not be used as the sole basis for treatment or other patient management decisions. A negative result may occur with  improper specimen collection/handling, submission of specimen other than nasopharyngeal swab, presence of viral mutation(s) within the areas targeted by this assay, and inadequate number of viral copies(<138 copies/mL). A negative result must be combined with clinical observations, patient history, and epidemiological information. The expected result is Negative.  Fact Sheet for Patients:  EntrepreneurPulse.com.au  Fact Sheet for Healthcare Providers:  IncredibleEmployment.be  This test is no t yet approved or cleared by the [place] FDA and  has been authorized for detection and/or diagnosis of SARS-CoV-2 by FDA under an Emergency Use Authorization (EUA). This EUA will remain  in effect (meaning this test  can be used) for the duration of the COVID-19 declaration under Section 564(b)(1) of the Act, 21 U.S.C.section 360bbb-3(b)(1), unless the authorization is terminated  or revoked sooner.       Influenza A by PCR NEGATIVE NEGATIVE Final   Influenza B by PCR NEGATIVE NEGATIVE Final    Comment: (NOTE) The Xpert Xpress SARS-CoV-2/FLU/RSV plus assay is intended as an aid in the diagnosis of influenza from Nasopharyngeal swab specimens and should not be used as a sole basis for treatment. Nasal washings and aspirates are unacceptable for Xpert Xpress SARS-CoV-2/FLU/RSV testing.  Fact Sheet for Patients: EntrepreneurPulse.com.au  Fact Sheet for Healthcare Providers: IncredibleEmployment.be  This test is not yet approved or cleared by the [place] FDA and has been authorized for detection and/or diagnosis of SARS-CoV-2 by FDA under an Emergency Use Authorization (EUA). This EUA will remain in effect (meaning this test can be used) for the duration of the COVID-19 declaration under Section 564(b)(1) of the Act, 21 U.S.C. section 360bbb-3(b)(1), unless the authorization is terminated or revoked.  Performed at [facility], [address] [place], [place] [address]   Blood culture (routine x 2)     Status: None (Preliminary result)   Collection Time: 05/16/21  5:08 PM   Specimen: BLOOD  Result Value Ref Range Status   Specimen Description BLOOD LEFT AC  Final   Special Requests   Final    BOTTLES DRAWN AEROBIC AND ANAEROBIC Blood Culture results may not be optimal due to an inadequate volume of blood received in culture bottles   Culture  Setup Time   Final    GRAM POSITIVE COCCI ANAEROBIC BOTTLE ONLY Gram Stain Report Called to,Read Back By and Verified With: ALEX CHAPPELL 05/17/21 @ 1142 BY SB Performed at [facility], [address] [place], [place] [address]    Culture GRAM POSITIVE COCCI  Final   Report Status  PENDING  Incomplete  Group A Strep by PCR     Status: None   Collection Time: 05/16/21  6:13 PM   Specimen: Throat; Sterile Swab  Result Value Ref Range Status   Group A Strep by PCR NOT DETECTED NOT DETECTED Final    Comment: Performed at [facility], [address] [place], [place] [address]  Blood culture (routine x 2)     Status: Abnormal (Preliminary result)   Collection Time: 05/16/21  6:54 PM   Specimen: BLOOD  Result Value Ref Range Status   Specimen Description   Final    BLOOD BLOOD RIGHT HAND Performed  at [facility], [address], [place], [place] [address]    Special Requests   Final    BOTTLES DRAWN AEROBIC AND ANAEROBIC Blood Culture adequate volume Performed at [facility], [address] [place], [place] [address]    Culture  Setup Time   Final    GRAM POSITIVE COCCI ANAEROBIC BOTTLE ONLY Gram Stain Report Called to,Read Back By and Verified With: ALEX CHAPPELL 05/17/21 @ 1242 BY SB Organism ID to follow Performed at [facility], [address] [place], [place] [address]    Culture (A)  Final    STAPHYLOCOCCUS EPIDERMIDIS SUSCEPTIBILITIES TO FOLLOW Performed at [facility], [place] [address] [place], [place] [address]    Report Status PENDING  Incomplete  Blood Culture ID Panel (Reflexed)     Status: Abnormal   Collection Time: 05/16/21  6:54 PM  Result Value Ref Range Status   Enterococcus faecalis NOT DETECTED NOT DETECTED Final   Enterococcus Faecium NOT DETECTED NOT DETECTED Final   Listeria monocytogenes NOT DETECTED NOT DETECTED Final   Staphylococcus species DETECTED (A) NOT DETECTED Final    Comment: CRITICAL RESULT CALLED TO, READ BACK BY AND VERIFIED WITH: ALEX CHAPPELL 05/17/21 @ 1242 BY SB    Staphylococcus aureus (BCID) NOT DETECTED NOT DETECTED Final   Staphylococcus epidermidis DETECTED (A) NOT DETECTED Final   Staphylococcus lugdunensis NOT DETECTED NOT DETECTED Final   Streptococcus  species NOT DETECTED NOT DETECTED Final   Streptococcus agalactiae NOT DETECTED NOT DETECTED Final   Streptococcus pneumoniae NOT DETECTED NOT DETECTED Final   Streptococcus pyogenes NOT DETECTED NOT DETECTED Final   A.calcoaceticus-baumannii NOT DETECTED NOT DETECTED Final   Bacteroides fragilis NOT DETECTED NOT DETECTED Final   Enterobacterales NOT DETECTED NOT DETECTED Final   Enterobacter cloacae complex NOT DETECTED NOT DETECTED Final   Escherichia coli NOT DETECTED NOT DETECTED Final   Klebsiella aerogenes NOT DETECTED NOT DETECTED Final   Klebsiella oxytoca NOT DETECTED NOT DETECTED Final   Klebsiella pneumoniae NOT DETECTED NOT DETECTED Final   Proteus species NOT DETECTED NOT DETECTED Final   Salmonella species NOT DETECTED NOT DETECTED Final   Serratia marcescens NOT DETECTED NOT DETECTED Final   Haemophilus influenzae NOT DETECTED NOT DETECTED Final   Neisseria meningitidis NOT DETECTED NOT DETECTED Final   Pseudomonas aeruginosa NOT DETECTED NOT DETECTED Final   Stenotrophomonas maltophilia NOT DETECTED NOT DETECTED Final   Candida albicans NOT DETECTED NOT DETECTED Final   Candida auris NOT DETECTED NOT DETECTED Final   Candida glabrata NOT DETECTED NOT DETECTED Final   Candida krusei NOT DETECTED NOT DETECTED Final   Candida parapsilosis NOT DETECTED NOT DETECTED Final   Candida tropicalis NOT DETECTED NOT DETECTED Final   Cryptococcus neoformans/gattii NOT DETECTED NOT DETECTED Final   Methicillin resistance mecA/C NOT DETECTED NOT DETECTED Final    Comment: Performed at [facility], [address] [place], [place] [address]         Radiology Studies: CT ABDOMEN PELVIS W CONTRAST  Result Date: 05/16/2021 CLINICAL DATA:  Right lower quadrant abdominal pain. EXAM: CT ABDOMEN AND PELVIS WITH CONTRAST TECHNIQUE: Multidetector CT imaging of the abdomen and pelvis was performed using the standard protocol following bolus administration of intravenous  contrast. CONTRAST:  174m OMNIPAQUE IOHEXOL 300 MG/ML  SOLN COMPARISON:  CT October 23, 2019 FINDINGS: Lower chest: No acute abnormality. Hepatobiliary: No suspicious hepatic lesion. Gallbladder is unremarkable. No biliary ductal dilation. Pancreas: No pancreatic ductal dilation or evidence of  acute inflammation. Spleen: Within normal limits. Adrenals/Urinary Tract: Bilateral adrenal glands are unremarkable. No hydronephrosis. No solid enhancing renal mass. 3 cm left lower pole renal cyst. Right renal scarring. Symmetric enhancement of the bilateral kidneys. Mild wall thickening of an incompletely distended urinary bladder. Stomach/Bowel: No enteric contrast was administered. Small hiatal hernia otherwise the stomach is unremarkable for degree of distension. No pathologic dilation of small or large bowel. Fluid-filled appendix visualized in the right lower quadrant measuring measuring upper limits of normal at 7 mm without wall thickening or inflammatory stranding. No evidence of acute bowel inflammation. Vascular/Lymphatic: Aortic and branch vessel atherosclerosis without abdominal aortic aneurysm. No pathologically enlarged abdominal or pelvic lymph nodes. Reproductive: Prostate gland is unremarkable. Similar penile calcifications. Other: No pneumoperitoneum. No significant abdominopelvic ascites. Prior left inguinal hernia repair. Musculoskeletal: Multilevel degenerative changes spine. No acute osseous abnormality. IMPRESSION: 1. Fluid-filled appendix measuring upper limits of normal at 7 mm without wall thickening or inflammatory stranding. Findings are equivocal for early acute appendicitis. 2. Mild wall thickening of an incompletely distended urinary bladder. Correlate with urinalysis to exclude cystitis. 3. Aortic Atherosclerosis (ICD10-I70.0). Electronically Signed   By: Dahlia Bailiff M.D.   On: 05/16/2021 16:51   DG Chest Portable 1 View  Result Date: 05/16/2021 CLINICAL DATA:  Rule out pneumonia. Sore  throat and fever for 3 days. EXAM: PORTABLE CHEST 1 VIEW COMPARISON:  October 01, 2013 FINDINGS: There is a vascular stent in the superior mediastinum to the left. The heart, hila, and mediastinum are otherwise unchanged and unremarkable. No pneumothorax. No nodules or masses. No focal infiltrates. Mild atelectasis in the left base. IMPRESSION: No cause for the patient's symptoms identified. No pneumonia identified. Electronically Signed   By: Dorise Bullion III M.D.   On: 05/16/2021 15:40        Scheduled Meds:  amLODipine  10 mg Oral Daily   aspirin EC  81 mg Oral Daily   carbamide peroxide  5 drop Both EARS BID   clopidogrel  75 mg Oral Daily   gabapentin  800 mg Oral TID   heparin  5,000 Units Subcutaneous Q8H   hydrochlorothiazide  25 mg Oral Daily   insulin aspart  0-15 Units Subcutaneous TID WC   insulin aspart  0-5 Units Subcutaneous QHS   insulin detemir  20 Units Subcutaneous BID   levETIRAcetam  500 mg Oral Daily   OLANZapine  5 mg Oral QHS   pantoprazole  40 mg Oral BID   polyethylene glycol  17 g Oral Daily   silver sulfADIAZINE   Topical BID   Continuous Infusions:   ceFAZolin (ANCEF) IV 2 g (05/18/21 0527)   lactated ringers 75 mL/hr at 05/18/21 0745     LOS: 1 day    Time spent: 34 mins     Wyvonnia Dusky, MD [facility] Pager [phone]  If 7PM-7AM, please contact night-coverage 05/18/2021, 9:17 AM

## 2021-05-19 DIAGNOSIS — A419 Sepsis, unspecified organism: Principal | ICD-10-CM

## 2021-05-19 DIAGNOSIS — K3580 Unspecified acute appendicitis: Secondary | ICD-10-CM | POA: Diagnosis not present

## 2021-05-19 DIAGNOSIS — E1165 Type 2 diabetes mellitus with hyperglycemia: Secondary | ICD-10-CM | POA: Diagnosis not present

## 2021-05-19 DIAGNOSIS — R1031 Right lower quadrant pain: Secondary | ICD-10-CM

## 2021-05-19 DIAGNOSIS — R6520 Severe sepsis without septic shock: Secondary | ICD-10-CM

## 2021-05-19 DIAGNOSIS — R739 Hyperglycemia, unspecified: Secondary | ICD-10-CM

## 2021-05-19 DIAGNOSIS — I10 Essential (primary) hypertension: Secondary | ICD-10-CM | POA: Diagnosis not present

## 2021-05-19 LAB — GLUCOSE, CAPILLARY
Glucose-Capillary: 233 mg/dL — ABNORMAL HIGH (ref 70–99)
Glucose-Capillary: 308 mg/dL — ABNORMAL HIGH (ref 70–99)
Glucose-Capillary: 100 mg/dL — ABNORMAL HIGH (ref 70–99)
Glucose-Capillary: 197 mg/dL — ABNORMAL HIGH (ref 70–99)
Glucose-Capillary: 403 mg/dL — ABNORMAL HIGH (ref 70–99)

## 2021-05-19 LAB — CBC
HCT: 35.3 % — ABNORMAL LOW (ref 39.0–52.0)
Hemoglobin: 11.5 g/dL — ABNORMAL LOW (ref 13.0–17.0)
MCH: 31.6 pg (ref 26.0–34.0)
MCHC: 32.6 g/dL (ref 30.0–36.0)
MCV: 97 fL (ref 80.0–100.0)
Platelets: 301 10*3/uL (ref 150–400)
RBC: 3.64 MIL/uL — ABNORMAL LOW (ref 4.22–5.81)
RDW: 12.3 % (ref 11.5–15.5)
WBC: 11.6 10*3/uL — ABNORMAL HIGH (ref 4.0–10.5)
nRBC: 0 % (ref 0.0–0.2)

## 2021-05-19 LAB — CULTURE, BLOOD (ROUTINE X 2): Special Requests: ADEQUATE

## 2021-05-19 LAB — BASIC METABOLIC PANEL
Anion gap: 7 (ref 5–15)
BUN: 19 mg/dL (ref 6–20)
CO2: 31 mmol/L (ref 22–32)
Calcium: 8.2 mg/dL — ABNORMAL LOW (ref 8.9–10.3)
Chloride: 97 mmol/L — ABNORMAL LOW (ref 98–111)
Creatinine, Ser: 1.31 mg/dL — ABNORMAL HIGH (ref 0.61–1.24)
GFR, Estimated: >60 mL/min (ref >60)
Glucose, Bld: 252 mg/dL — ABNORMAL HIGH (ref 70–99)
Potassium: 4 mmol/L (ref 3.5–5.1)
Sodium: 135 mmol/L (ref 135–145)

## 2021-05-19 MED ORDER — HYDRALAZINE HCL 10 MG PO TABS
10.0000 mg | ORAL_TABLET | Freq: Four times a day (QID) | ORAL | Status: DC | PRN
  Administered 2021-05-20: 10 mg via ORAL
  Filled 2021-05-19 (×3): qty 1

## 2021-05-19 MED ORDER — CEPHALEXIN 500 MG PO CAPS
500.0000 mg | ORAL_CAPSULE | Freq: Three times a day (TID) | ORAL | Status: DC
  Administered 2021-05-19 – 2021-05-20 (×4): 500 mg via ORAL
  Filled 2021-05-19 (×4): qty 1

## 2021-05-19 NOTE — Progress Notes (Signed)
PROGRESS NOTE  Samuel [name]    DOB: [birth year], 60 y.o.  JJK:[id number]  PCP: Pcp, No   Code Status: Full Code   DOA: 05/16/2021   LOS: 2  Brief Narrative of Current Hospitalization  Samuel [name] is a 60 y.o. male with a PMH significant for type II DM, HTN, left AKA, right TMA, CAD. They presented from home to the ED on 05/16/2021 with sepsis thought to be due to intra-abdominal source with possible early appendicitis vs cellulitis.  General surgery was consulted and recommended against surgical intervention.  Patient was admitted to medicine service for further workup and management of infection/sepsis as outlined in detail below.  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  Vital signs stable on room air today and afebrile overnight.  Initially thought to be due to appendicitis.  General surgery has evaluated and recommended against surgery.  -General surgery has signed off today for outpatient follow-up -Advance diet as tolerated -As needed analgesia, antiemetics  cellulitis right foot-improvement in erythema - continue keflex  Diabetes Mellitus with neuropathy -Continue home gabapentin -Continue 20 units Lantus -Continue sliding scale  HTN-chronic, poorly controlled.  Asymptomatic. -Continue home amlodipine -Follow-up with PCP  Behavioral health -Continue home olanzapine  Homelessness-barrier to discharge due to poor support network for follow-up and unable to care for wounds -TOC consulted for resources  Tobacco dependence- -Smoking cessation counseling -Nicotine patch  DVT prophylaxis:  heparin injection 5,000 Units Start: 05/16/21 2200 Place TED hose Start: 05/16/21 1753   Diet:  Diet Orders (From admission, onward)     Start     Ordered   05/16/21 1754  Diet heart healthy/carb modified Room service appropriate? Yes; Fluid consistency: Thin  Diet effective now       Question Answer Comment  Diet-HS Snack? Nothing   Room service appropriate? Yes   Fluid consistency: Thin      05/16/21 1753            Subjective 05/19/21    Pt reports improvement in pain.   Disposition Plan & Communication  Patient status: Inpatient  Admitted From: Home Disposition: Home Anticipated discharge date: 11/10 due to unsafe dispo plan  Family Communication: none  Consults, Procedures, Significant Events  Consultants:  General surgery  Procedures/significant events:  none Antimicrobials:  Anti-infectives (From admission, onward)    Start     Dose/Rate Route Frequency Ordered Stop   05/17/21 1800  cefTRIAXone (ROCEPHIN) 2 g in sodium chloride 0.9 % 100 mL IVPB  Status:  Discontinued        2 g 200 mL/hr over 30 Minutes Intravenous Every 24 hours 05/16/21 1759 05/17/21 1316   05/17/21 1430  ceFAZolin (ANCEF) IVPB 2g/100 mL premix        2 g 200 mL/hr over 30 Minutes Intravenous Every 8 hours 05/17/21 1316     05/16/21 1800  cefTRIAXone (ROCEPHIN) 1 g in sodium chloride 0.9 % 100 mL IVPB  Status:  Discontinued        1 g 200 mL/hr over 30 Minutes Intravenous  Once 05/16/21 1751 05/16/21 1758   05/16/21 1800  metroNIDAZOLE (FLAGYL) IVPB 500 mg  Status:  Discontinued        500 mg 100 mL/hr over 60 Minutes Intravenous  Once 05/16/21 1751 05/16/21 1759   05/16/21 1800  cefTRIAXone (ROCEPHIN) 2 g in sodium chloride 0.9 % 100 mL IVPB        2 g 200 mL/hr over 30 Minutes Intravenous  Once 05/16/21 1758 05/16/21 1932   05/16/21 1800  metroNIDAZOLE (FLAGYL) IVPB 500 mg  Status:  Discontinued        500 mg 100 mL/hr over 60 Minutes Intravenous Every 12 hours 05/16/21 1759  05/17/21 1317       Objective   Vitals:   05/18/21 1510 05/18/21 2010 05/18/21 2351 05/19/21 0403  BP: (!) 154/87 (!) 159/91 (!) 168/88 (!) 169/89  Pulse: 92 89 95 95  Resp: 16 17 17 18   Temp: 98.1 F (36.7 C) 98.2 F (36.8 C) 98.3 F (36.8 C) 98.7 F (37.1 C)  TempSrc:      SpO2: 97% 91% 91% 94%  Weight:      Height:        Intake/Output Summary (Last 24 hours) at 05/19/2021 0716 Last data filed at 05/19/2021 0500 Gross per 24 hour  Intake 479.67 ml  Output 1350 ml  Net -870.33 ml   Filed Weights   05/16/21 1237  Weight: 72.6 kg    Patient BMI: Body mass index is 22.96 kg/m.   Physical Exam: General: asleep, NAD Respiratory: normal respiratory effort. Cardiovascular: quick capillary refill  Extremities: positive erythema, edema r TMA. Left AKA Skin: dry, intact, normal temperature, normal color, other than noted above  Labs   I have personally reviewed following labs and imaging studies Admission on 05/16/2021  Component Date Value Ref Range Status   Sodium 05/16/2021 131 (A)  135 - 145 mmol/L Final   Potassium 05/16/2021 3.6  3.5 - 5.1 mmol/L Final   Chloride 05/16/2021 93 (A)  98 - 111 mmol/L Final   CO2 05/16/2021 29  22 - 32 mmol/L Final   Glucose, Bld 05/16/2021 560 (A)  70 - 99 mg/dL Final   BUN 05/16/2021 13  6 - 20 mg/dL Final   Creatinine, Ser 05/16/2021 1.10  0.61 - 1.24 mg/dL Final   Calcium 05/16/2021 8.6 (A)  8.9 - 10.3 mg/dL Final   Total Protein 05/16/2021 7.0  6.5 - 8.1 g/dL Final   Albumin 05/16/2021 3.2 (A)  3.5 - 5.0 g/dL Final   AST 05/16/2021 14 (A)  15 - 41 U/L Final   ALT 05/16/2021 10  0 - 44 U/L Final   Alkaline Phosphatase 05/16/2021 122  38 - 126 U/L Final   Total Bilirubin 05/16/2021 0.7  0.3 - 1.2 mg/dL Final   GFR, Estimated 05/16/2021 >60  >60 mL/min Final   Anion gap 05/16/2021 9  5 - 15 Final   WBC 05/16/2021 14.3 (A)  4.0 - 10.5 K/uL Final   RBC 05/16/2021 4.08 (A)  4.22 - 5.81 MIL/uL Final   Hemoglobin 05/16/2021 13.4   13.0 - 17.0 g/dL Final   HCT 05/16/2021 39.0  39.0 - 52.0 % Final   MCV 05/16/2021 95.6  80.0 - 100.0 fL Final   MCH 05/16/2021 32.8  26.0 - 34.0 pg Final   MCHC 05/16/2021 34.4  30.0 - 36.0 g/dL Final   RDW 05/16/2021 12.0  11.5 - 15.5 % Final   Platelets 05/16/2021 319  150 - 400 K/uL Final   nRBC 05/16/2021 0.0  0.0 - 0.2 % Final   Neutrophils Relative % 05/16/2021  81  % Final   Neutro Abs 05/16/2021 11.7 (A)  1.7 - 7.7 K/uL Final   Lymphocytes Relative 05/16/2021 13  % Final   Lymphs Abs 05/16/2021 1.8  0.7 - 4.0 K/uL Final   Monocytes Relative 05/16/2021 4  % Final   Monocytes Absolute 05/16/2021 0.6  0.1 - 1.0 K/uL Final   Eosinophils Relative 05/16/2021 1  % Final   Eosinophils Absolute 05/16/2021 0.1  0.0 - 0.5 K/uL Final   Basophils Relative 05/16/2021 0  % Final   Basophils Absolute 05/16/2021 0.1  0.0 - 0.1 K/uL Final   Immature Granulocytes 05/16/2021 1  % Final   Abs Immature Granulocytes 05/16/2021 0.08 (A)  0.00 - 0.07 K/uL Final   Troponin I (High Sensitivity) 05/16/2021 12  <18 ng/L Final   SARS Coronavirus 2 by RT PCR 05/16/2021 NEGATIVE  NEGATIVE Final   Influenza A by PCR 05/16/2021 NEGATIVE  NEGATIVE Final   Influenza B by PCR 05/16/2021 NEGATIVE  NEGATIVE Final   Glucose-Capillary 05/16/2021 353 (A)  70 - 99 mg/dL Final   Color, Urine 05/16/2021 STRAW (A)  YELLOW Final   APPearance 05/16/2021 CLEAR (A)  CLEAR Final   Specific Gravity, Urine 05/16/2021 1.027  1.005 - 1.030 Final   pH 05/16/2021 7.0  5.0 - 8.0 Final   Glucose, UA 05/16/2021 >=500 (A)  NEGATIVE mg/dL Final   Hgb urine dipstick 05/16/2021 SMALL (A)  NEGATIVE Final   Bilirubin Urine 05/16/2021 NEGATIVE  NEGATIVE Final   Ketones, ur 05/16/2021 NEGATIVE  NEGATIVE mg/dL Final   Protein, ur 05/16/2021 100 (A)  NEGATIVE mg/dL Final   Nitrite 05/16/2021 NEGATIVE  NEGATIVE Final   Leukocytes,Ua 05/16/2021 NEGATIVE  NEGATIVE Final   RBC / HPF 05/16/2021 0-5  0 - 5 RBC/hpf Final   WBC, UA 05/16/2021 0-5   0 - 5 WBC/hpf Final   Bacteria, UA 05/16/2021 NONE SEEN  NONE SEEN Final   Squamous Epithelial / LPF 05/16/2021 0-5  0 - 5 Final   Troponin I (High Sensitivity) 05/16/2021 10  <18 ng/L Final   Specimen Description 05/16/2021    Final                   Value:BLOOD LEFT [facility] Performed at [facility], [address] [place], [place] [address]    Special Requests 05/16/2021    Final                   Value:BOTTLES DRAWN AEROBIC AND ANAEROBIC Blood Culture results may not be optimal due to an inadequate volume of blood received in culture bottles Performed at [facility], [address] [place], [place] [address]    Culture  Setup Time 05/16/2021    Final                   Value:GRAM POSITIVE COCCI ANAEROBIC BOTTLE ONLY Gram Stain Report Called to,Read Back By and Verified With: ALEX CHAPPELL 05/17/21 @ 1142 BY SB Performed at [facility], [address], [place], [place] [address]    Culture 05/16/2021 STAPHYLOCOCCUS HOMINIS (A)   Final   Report Status 05/16/2021 PENDING   Incomplete   Specimen Description 05/16/2021    Final                   Value:BLOOD BLOOD RIGHT HAND Performed at [facility], [address] [place], [place] [address]    Special Requests 05/16/2021    Final  Value:BOTTLES DRAWN AEROBIC AND ANAEROBIC Blood Culture adequate volume Performed at [facility], [address] [place], [place] [address]    Culture  Setup Time 05/16/2021    Final                   Value:GRAM POSITIVE COCCI ANAEROBIC BOTTLE ONLY Gram Stain Report Called to,Read Back By and Verified With: ALEX CHAPPELL 05/17/21 @ 1242 BY SB Organism ID to follow Performed at [facility], [address] [place], [place] [address]    Culture 05/16/2021 STAPHYLOCOCCUS EPIDERMIDIS (A)   Final   Report Status 05/16/2021 PENDING   Incomplete   Lactic Acid, Venous 05/16/2021 1.2  0.5 - 1.9 mmol/L Final   Lactic Acid, Venous  05/16/2021 1.2  0.5 - 1.9 mmol/L Final   Lipase 05/16/2021 26  11 - 51 U/L Final   HIV Screen 4th Generation wRfx 05/16/2021 Non Reactive  Non Reactive Final   WBC 05/17/2021 9.2  4.0 - 10.5 K/uL Final   RBC 05/17/2021 3.59 (A)  4.22 - 5.81 MIL/uL Final   Hemoglobin 05/17/2021 11.4 (A)  13.0 - 17.0 g/dL Final   HCT 05/17/2021 34.4 (A)  39.0 - 52.0 % Final   MCV 05/17/2021 95.8  80.0 - 100.0 fL Final   MCH 05/17/2021 31.8  26.0 - 34.0 pg Final   MCHC 05/17/2021 33.1  30.0 - 36.0 g/dL Final   RDW 05/17/2021 12.0  11.5 - 15.5 % Final   Platelets 05/17/2021 261  150 - 400 K/uL Final   nRBC 05/17/2021 0.0  0.0 - 0.2 % Final   Sodium 05/17/2021 137  135 - 145 mmol/L Final   Potassium 05/17/2021 3.0 (A)  3.5 - 5.1 mmol/L Final   Chloride 05/17/2021 100  98 - 111 mmol/L Final   CO2 05/17/2021 29  22 - 32 mmol/L Final   Glucose, Bld 05/17/2021 242 (A)  70 - 99 mg/dL Final   BUN 05/17/2021 15  6 - 20 mg/dL Final   Creatinine, Ser 05/17/2021 1.11  0.61 - 1.24 mg/dL Final   Calcium 05/17/2021 8.1 (A)  8.9 - 10.3 mg/dL Final   Total Protein 05/17/2021 5.8 (A)  6.5 - 8.1 g/dL Final   Albumin 05/17/2021 2.5 (A)  3.5 - 5.0 g/dL Final   AST 05/17/2021 10 (A)  15 - 41 U/L Final   ALT 05/17/2021 9  0 - 44 U/L Final   Alkaline Phosphatase 05/17/2021 92  38 - 126 U/L Final   Total Bilirubin 05/17/2021 0.4  0.3 - 1.2 mg/dL Final   GFR, Estimated 05/17/2021 >60  >60 mL/min Final   Anion gap 05/17/2021 8  5 - 15 Final   Group A Strep by PCR 05/16/2021 NOT DETECTED  NOT DETECTED Final   Glucose-Capillary 05/17/2021 346 (A)  70 - 99 mg/dL Final   Procalcitonin 05/17/2021 <0.10  ng/mL Final   Glucose-Capillary 05/17/2021 228 (A)  70 - 99 mg/dL Final   Enterococcus faecalis 05/16/2021 NOT DETECTED  NOT DETECTED Final   Enterococcus Faecium 05/16/2021 NOT DETECTED  NOT DETECTED Final   Listeria monocytogenes 05/16/2021 NOT DETECTED  NOT DETECTED Final   Staphylococcus species 05/16/2021 DETECTED (A)  NOT  DETECTED Final   Staphylococcus aureus (BCID) 05/16/2021 NOT DETECTED  NOT DETECTED Final   Staphylococcus epidermidis 05/16/2021 DETECTED (A)  NOT DETECTED Final   Staphylococcus lugdunensis 05/16/2021 NOT DETECTED  NOT DETECTED Final   Streptococcus species 05/16/2021 NOT DETECTED  NOT DETECTED Final   Streptococcus  agalactiae 05/16/2021 NOT DETECTED  NOT DETECTED Final   Streptococcus pneumoniae 05/16/2021 NOT DETECTED  NOT DETECTED Final   Streptococcus pyogenes 05/16/2021 NOT DETECTED  NOT DETECTED Final   A.calcoaceticus-baumannii 05/16/2021 NOT DETECTED  NOT DETECTED Final   Bacteroides fragilis 05/16/2021 NOT DETECTED  NOT DETECTED Final   Enterobacterales 05/16/2021 NOT DETECTED  NOT DETECTED Final   Enterobacter cloacae complex 05/16/2021 NOT DETECTED  NOT DETECTED Final   Escherichia coli 05/16/2021 NOT DETECTED  NOT DETECTED Final   Klebsiella aerogenes 05/16/2021 NOT DETECTED  NOT DETECTED Final   Klebsiella oxytoca 05/16/2021 NOT DETECTED  NOT DETECTED Final   Klebsiella pneumoniae 05/16/2021 NOT DETECTED  NOT DETECTED Final   Proteus species 05/16/2021 NOT DETECTED  NOT DETECTED Final   Salmonella species 05/16/2021 NOT DETECTED  NOT DETECTED Final   Serratia marcescens 05/16/2021 NOT DETECTED  NOT DETECTED Final   Haemophilus influenzae 05/16/2021 NOT DETECTED  NOT DETECTED Final   Neisseria meningitidis 05/16/2021 NOT DETECTED  NOT DETECTED Final   Pseudomonas aeruginosa 05/16/2021 NOT DETECTED  NOT DETECTED Final   Stenotrophomonas maltophilia 05/16/2021 NOT DETECTED  NOT DETECTED Final   Candida albicans 05/16/2021 NOT DETECTED  NOT DETECTED Final   Candida auris 05/16/2021 NOT DETECTED  NOT DETECTED Final   Candida glabrata 05/16/2021 NOT DETECTED  NOT DETECTED Final   Candida krusei 05/16/2021 NOT DETECTED  NOT DETECTED Final   Candida parapsilosis 05/16/2021 NOT DETECTED  NOT DETECTED Final   Candida tropicalis 05/16/2021 NOT DETECTED  NOT DETECTED Final    Cryptococcus neoformans/gattii 05/16/2021 NOT DETECTED  NOT DETECTED Final   Methicillin resistance mecA/C 05/16/2021 NOT DETECTED  NOT DETECTED Final   Glucose-Capillary 05/17/2021 229 (A)  70 - 99 mg/dL Final   Glucose-Capillary 05/17/2021 115 (A)  70 - 99 mg/dL Final   WBC 05/18/2021 9.5  4.0 - 10.5 K/uL Final   RBC 05/18/2021 3.64 (A)  4.22 - 5.81 MIL/uL Final   Hemoglobin 05/18/2021 11.8 (A)  13.0 - 17.0 g/dL Final   HCT 05/18/2021 35.5 (A)  39.0 - 52.0 % Final   MCV 05/18/2021 97.5  80.0 - 100.0 fL Final   MCH 05/18/2021 32.4  26.0 - 34.0 pg Final   MCHC 05/18/2021 33.2  30.0 - 36.0 g/dL Final   RDW 05/18/2021 12.3  11.5 - 15.5 % Final   Platelets 05/18/2021 285  150 - 400 K/uL Final   nRBC 05/18/2021 0.0  0.0 - 0.2 % Final   Sodium 05/18/2021 137  135 - 145 mmol/L Final   Potassium 05/18/2021 3.8  3.5 - 5.1 mmol/L Final   Chloride 05/18/2021 101  98 - 111 mmol/L Final   CO2 05/18/2021 29  22 - 32 mmol/L Final   Glucose, Bld 05/18/2021 130 (A)  70 - 99 mg/dL Final   BUN 05/18/2021 14  6 - 20 mg/dL Final   Creatinine, Ser 05/18/2021 1.22  0.61 - 1.24 mg/dL Final   Calcium 05/18/2021 8.2 (A)  8.9 - 10.3 mg/dL Final   GFR, Estimated 05/18/2021 >60  >60 mL/min Final   Anion gap 05/18/2021 7  5 - 15 Final   Glucose-Capillary 05/17/2021 211 (A)  70 - 99 mg/dL Final   Comment 1 05/17/2021 Notify RN   Final   Comment 2 05/17/2021 Document in Chart   Final   Glucose-Capillary 05/18/2021 129 (A)  70 - 99 mg/dL Final   Glucose-Capillary 05/18/2021 173 (A)  70 - 99 mg/dL Final   Glucose-Capillary 05/18/2021 96  70 - 99  mg/dL Final   WBC 05/19/2021 11.6 (A)  4.0 - 10.5 K/uL Final   RBC 05/19/2021 3.64 (A)  4.22 - 5.81 MIL/uL Final   Hemoglobin 05/19/2021 11.5 (A)  13.0 - 17.0 g/dL Final   HCT 05/19/2021 35.3 (A)  39.0 - 52.0 % Final   MCV 05/19/2021 97.0  80.0 - 100.0 fL Final   MCH 05/19/2021 31.6  26.0 - 34.0 pg Final   MCHC 05/19/2021 32.6  30.0 - 36.0 g/dL Final   RDW 05/19/2021  12.3  11.5 - 15.5 % Final   Platelets 05/19/2021 301  150 - 400 K/uL Final   nRBC 05/19/2021 0.0  0.0 - 0.2 % Final   Sodium 05/19/2021 135  135 - 145 mmol/L Final   Potassium 05/19/2021 4.0  3.5 - 5.1 mmol/L Final   Chloride 05/19/2021 97 (A)  98 - 111 mmol/L Final   CO2 05/19/2021 31  22 - 32 mmol/L Final   Glucose, Bld 05/19/2021 252 (A)  70 - 99 mg/dL Final   BUN 05/19/2021 19  6 - 20 mg/dL Final   Creatinine, Ser 05/19/2021 1.31 (A)  0.61 - 1.24 mg/dL Final   Calcium 05/19/2021 8.2 (A)  8.9 - 10.3 mg/dL Final   GFR, Estimated 05/19/2021 >60  >60 mL/min Final   Anion gap 05/19/2021 7  5 - 15 Final   Glucose-Capillary 05/18/2021 199 (A)  70 - 99 mg/dL Final   Glucose-Capillary 05/19/2021 233 (A)  70 - 99 mg/dL Final    Imaging Studies  No results found. Medications   Scheduled Meds:  amLODipine  10 mg Oral Daily   aspirin EC  81 mg Oral Daily   carbamide peroxide  5 drop Both EARS BID   clopidogrel  75 mg Oral Daily   gabapentin  800 mg Oral TID   heparin  5,000 Units Subcutaneous Q8H   hydrochlorothiazide  25 mg Oral Daily   insulin aspart  0-15 Units Subcutaneous TID WC   insulin aspart  0-5 Units Subcutaneous QHS   insulin detemir  20 Units Subcutaneous BID   levETIRAcetam  500 mg Oral Daily   OLANZapine  5 mg Oral QHS   pantoprazole  40 mg Oral BID   polyethylene glycol  17 g Oral Daily   silver sulfADIAZINE   Topical BID   No recently discontinued medications to reconcile  LOS: 2 days   Time spent: >37min  Jame Seelig L Nero Sawatzky, DO [facility] 05/19/2021, 7:16 AM   Please refer to amion to contact the [facility] Attending or Consulting provider for this pt  [email or web address] Available by Epic secure chat 7AM-7PM. If 7PM-7AM, please contact night-coverage

## 2021-05-19 NOTE — TOC Progression Note (Addendum)
Transition of Care [facility]) - Progression Note    Patient Details  Name: Samuel [name] MRN: [id number] Date of Birth: 06/23/1961  Transition of [facility]) CM/SW Contact  Marlowe Sax, RN Phone Number: 05/19/2021, 10:30 AM  Clinical Narrative:   Provided the patient with [employer], [employer] , phone number to call and get information on Transportation available and if he qualifies I called [employer] in [place] to ask if they do [employer], The person answering the phone stated that they may offer when requested by the patient if they qualkfy, she was not sure what qulaifies   Expected Discharge Plan: Home/Self Care Barriers to Discharge: Continued Medical Work up  Expected Discharge Plan and Services Expected Discharge Plan: Home/Self Care   Discharge Planning Services: CM Consult   Living arrangements for the past 2 months: Single Family Home                 DME Arranged: N/A         HH Arranged: NA           Social Determinants of Health (SDOH) Interventions    Readmission Risk Interventions No flowsheet data found.

## 2021-05-19 NOTE — Progress Notes (Signed)
[facility] SURGICAL PROGRESS NOTE (cpt [id number])  Hospital Day(s): 2.   Interval History: Patient seen and examined, no acute events or new complaints overnight. Patient reports he continues to feel better compared to presentation. He has some LLQ soreness. Absolutely no RLQ pain. No fever, chills, nausea, emesis. He did have a mild increase in leukocytosis to 11.6K, which seems to be near a baseline for him on results review through the last year. Slight bump in scr from his baseline to 1.31; UO - 1.3L. No significant electrolyte derangements. He continues on regular diet; tolerating well. He was able to have bowel movements yesterday.    Review of Systems:  Constitutional: denies fever, chills  HEENT: denies cough or congestion  Respiratory: denies any shortness of breath  Cardiovascular: denies chest pain or palpitations  Gastrointestinal: + abdominal pain (mild LLQ soreness), denied N/V, or diarrhea/and bowel function as per interval history Genitourinary: denies burning with urination or urinary frequency Musculoskeletal: + chronic pain  Vital signs in last 24 hours: [min-max] current  Temp:  [97.7 F (36.5 C)-98.7 F (37.1 C)] 98.7 F (37.1 C) (11/09 0403) Pulse Rate:  [89-100] 95 (11/09 0403) Resp:  [16-18] 18 (11/09 0403) BP: (154-175)/(87-94) 169/89 (11/09 0403) SpO2:  [91 %-99 %] 94 % (11/09 0403)     Height: 5\' 10"  (177.8 cm) Weight: 72.6 kg BMI (Calculated): 22.96   Intake/Output last 2 shifts:  11/08 0701 - 11/09 0700 In: 479.7 [P.O.:350; I.V.:129.7] Out: 1350 [Urine:1350]   Physical Exam:  Constitutional: alert, cooperative and no distress  HENT: normocephalic without obvious abnormality  Eyes: PERRL, EOM's grossly intact and symmetric  Respiratory: breathing non-labored at rest  Cardiovascular: regular rate and sinus rhythm  Gastrointestinal: soft, no significant tenderness, LLQ soreness, non-distended, no rebound/guarding. He is certainly without  peritonitis Musculoskeletal: right TMA, left BKA   Labs:  CBC Latest Ref Rng & Units 05/19/2021 05/18/2021 05/17/2021  WBC 4.0 - 10.5 K/uL 11.6(H) 9.5 9.2  Hemoglobin 13.0 - 17.0 g/dL 11.5(L) 11.8(L) 11.4(L)  Hematocrit 39.0 - 52.0 % 35.3(L) 35.5(L) 34.4(L)  Platelets 150 - 400 K/uL 301 285 261   CMP Latest Ref Rng & Units 05/19/2021 05/18/2021 05/17/2021  Glucose 70 - 99 mg/dL 252(H) 130(H) 242(H)  BUN 6 - 20 mg/dL 19 14 15   Creatinine 0.61 - 1.24 mg/dL 1.31(H) 1.22 1.11  Sodium 135 - 145 mmol/L 135 137 137  Potassium 3.5 - 5.1 mmol/L 4.0 3.8 3.0(L)  Chloride 98 - 111 mmol/L 97(L) 101 100  CO2 22 - 32 mmol/L 31 29 29   Calcium 8.9 - 10.3 mg/dL 8.2(L) 8.2(L) 8.1(L)  Total Protein 6.5 - 8.1 g/dL - - 5.8(L)  Total Bilirubin 0.3 - 1.2 mg/dL - - 0.4  Alkaline Phos 38 - 126 U/L - - 92  AST 15 - 41 U/L - - 10(L)  ALT 0 - 44 U/L - - 9     Imaging studies: No new pertinent imaging studies   Assessment/Plan: (ICD-10's: R10.31) 60 y.o. male admitted with chronic pain, found to have very mildly dilated appendix without inflammatory changes, abscess, nor perforation, now with resolution in abdominal pain and normalization of WBC               - Very low suspicion for appendicitis at this time given resolution in abdominal pain and leukocytosis without significant appendix changes seen on imaging. Low yield for surgical interventions currently.              - Okay to  continue diet              - Monitor abdominal examination; on-going bowel function             - Okay to continue Miralax for bowel regimen - Pain control prn; antiemetics prn - Further management per primary service   - Nothing further to add from general surgery perspective; we will sign off. I will arrange a follow up appointment with Dr Everlene Farrier in 3-4 weeks to ensure he has done well at home.    All of the above findings and recommendations were discussed with the patient, and the medical team, and all of patient's questions  were answered to his expressed satisfaction.  -- Lynden Oxford, PA-C [facility] 05/19/2021, 8:25 AM [phone] M-F: 7am - 4pm

## 2021-05-19 NOTE — Progress Notes (Signed)
PT Cancellation Note  Patient Details Name: Samuel [name] MRN: [id number] DOB: [birth year]   Cancelled Treatment:    Reason Eval/Treat Not Completed: PT screened, no needs identified, will sign off (Consult received and chart reviewed.  Per OT evaluation/session, patient mod indep with WC level transfers and mobility. No acute change in functional status noted; no acute PT needs identified.    Did voice barrier of broken prosthesis; patient has been provided with resources to assess/repair prosthesis (per TOC).  Did briefly discuss option of ambulation with RW until prosthesis repaired (versus crawling in home environment); patient declined this education, preferring to maintain WC level and move to new living environment as able at discharge.)  No additional PT needs identified at this time; patient in agreement.  Will complete PT order at this time. Please re-consult should needs change.   Nga Rabon H. Manson Passey, PT, DPT, NCS 05/19/21, 3:23 PM [phone]

## 2021-05-19 NOTE — Progress Notes (Signed)
Occupational Therapy Treatment Patient Details Name: Samuel [name] MRN: [id number] DOB: March 20, 1961 Today's Date: 05/19/2021   History of present illness Samuel [name] is a 60 y.o. male with medical history significant for insulin-dependent diabetes mellitus, hypertension, psychosis, left BKA, right TMA, CAD, has left prosthesis, who presents to the emergency department for chief concerns of hyperglycemic crisis, broken prosthesis and wounds on R LE and bilateral hands, as he has been crawling during the 2 weeks his prosthesis has been broken. He reports that over the last several days he has been having subjective fever, sore throat, cough, belly pain.   OT comments  Pt presents to OT services with impaired safety awareness and safety/independence with BADL tasks. Pt voices host of concerns regarding his ability to manage his health upon DC home at start of session. Discussed safe transfer techniques, falls prevention strategies, medication management strategies, and considerations for home accessibility. He demonstrates limited recall from past OT education. This author contacts case management to further facilitate connection to community resources including [employer] transportation and pharmacy blister packs for medication management. Pt able to perform SPT EOB<>WC with MOD I and is able to safely navigate a lap around the nurses station propelling his WC using BUE/RLE. Prior to functional mobility, pt dons a sock on his RLE with increased time/effort to perform and supervision for safety. Pt continues to benefit from skilled OT services. POC & frequency remain appropriate. DC recommendation updated as pt continues to make progress with OT services while acutely hospitalized.    Recommendations for follow up therapy are one component of a multi-disciplinary discharge planning process, led by the attending physician.  Recommendations may be updated based on patient status, additional functional criteria  and insurance authorization.    Follow Up Recommendations  No OT follow up    Assistance Recommended at Discharge PRN  Equipment Recommendations   (Pt would benefit from LLE prosthesis replacement.)    Recommendations for Other Services      Precautions / Restrictions Precautions Precautions: Fall Restrictions Other Position/Activity Restrictions: Hx of L BKA and R TMA.       Mobility Bed Mobility Overal bed mobility: Modified Independent                  Transfers Overall transfer level: Modified independent                 General transfer comment: Pt stand pivots from EOB to [facility] on RLE. He does not require physical asssist. He propels WC using RLE/BUE able to complete full lap around unit without assist.     Balance Overall balance assessment: Needs assistance Sitting-balance support: Feet supported;Single extremity supported Sitting balance-Leahy Scale: Fair Sitting balance - Comments: Baseline instability sitting EOB. C/o fear of falling when he does not have back support.   Standing balance support: Bilateral upper extremity supported;During functional activity;Reliant on assistive device for balance Standing balance-Leahy Scale: Poor                             ADL either performed or assessed with clinical judgement   ADL Overall ADL's : At baseline                                       General ADL Comments: Pt performs bed/functional mobility with MOD I during session. He is up ad lib in  room and performing self-care with MOD I. Pt does demonstrate decreased safety awareness (reports transferring barefoot and c/o slippery floors in hospital) educated on falls prevention strategies including importance of appropriate footwear for his RLE. Non-slip socks provided. Pt able to don RLE sock with MOD I and increased time/effort when long sitting in bed.    Extremity/Trunk Assessment Upper Extremity Assessment Upper Extremity  Assessment: Overall WFL for tasks assessed   Lower Extremity Assessment Lower Extremity Assessment: Overall WFL for tasks assessed        Vision Patient Visual Report: No change from baseline     Perception     Praxis      Cognition Arousal/Alertness: Awake/alert Behavior During Therapy: Restless;WFL for tasks assessed/performed Overall Cognitive Status: Within Functional Limits for tasks assessed                                            Exercises Other Exercises Other Exercises: Therapist provided education on community resources for asssitance with transportation, medication management, and accessiblity support. Case management consulted during session as well and provide pt wtih additional information. OT facilitates bed/functional mobility during session with education on safety and falls prvention provided t/o.   Shoulder Instructions       General Comments      Pertinent Vitals/ Pain       Pain Assessment: No/denies pain  Home Living                                          Prior Functioning/Environment              Frequency  Min 1X/week        Progress Toward Goals  OT Goals(current goals can now be found in the care plan section)  Progress towards OT goals: Progressing toward goals  Acute Rehab OT Goals Patient Stated Goal: To move to a more accessible home. OT Goal Formulation: With patient Time For Goal Achievement: 06/01/21 Potential to Achieve Goals: Good  Plan Frequency remains appropriate;Discharge plan needs to be updated    Co-evaluation                 AM-PAC OT "6 Clicks" Daily Activity     Outcome Measure   Help from another person eating meals?: None Help from another person taking care of personal grooming?: None Help from another person toileting, which includes using toliet, bedpan, or urinal?: None Help from another person bathing (including washing, rinsing, drying)?: A  Little Help from another person to put on and taking off regular upper body clothing?: None Help from another person to put on and taking off regular lower body clothing?: None 6 Click Score: 23    End of Session Equipment Utilized During Treatment:  (WC)  OT Visit Diagnosis: Unsteadiness on feet (R26.81);Muscle weakness (generalized) (M62.81)   Activity Tolerance Patient tolerated treatment well   Patient Left in bed;with call bell/phone within reach;with bed alarm set;with nursing/sitter in room   Nurse Communication          Time: [id number] OT Time Calculation (min): 40 min  Charges: OT General Charges $OT Visit: 1 Visit OT Treatments $Self Care/Home Management : 23-37 mins $Therapeutic Activity: 8-22 mins  Shara Blazing, M.S., OTR/L Feeding Team, Special Care Nursery Ascom: [phone]  05/19/21, 4:00 PM

## 2021-05-19 NOTE — Progress Notes (Addendum)
[facility] Cross Coverage Note  Received message from nursing staff that patient is noncompliant with diet ordered.   Patient BG is 403 at 2316.  Reviewed MAR:   Insulin Aspart 11 units given at 0853  Insulin aspart 5 units noted in [facility] given at 2334 Insulin detemir 20 units BID given at 853 and 2354  Assessment/Plan:   # Severe hyperglycemia - multifactorial in setting of patient noncompliance with diabetic diet - Advised RN to recheck BG at approximately 00:20 AM and chart in Epic - Additional insulin Aspart will be ordered if BG is > 200 after BG recheck at 00:20. - Goal inpatient GB is 140-180 - Diabetes coordinator consulted for DM diet education  Dr. Sedalia Muta

## 2021-05-20 DIAGNOSIS — F19951 Other psychoactive substance use, unspecified with psychoactive substance-induced psychotic disorder with hallucinations: Secondary | ICD-10-CM

## 2021-05-20 DIAGNOSIS — K3580 Unspecified acute appendicitis: Secondary | ICD-10-CM | POA: Diagnosis not present

## 2021-05-20 DIAGNOSIS — R739 Hyperglycemia, unspecified: Secondary | ICD-10-CM | POA: Diagnosis not present

## 2021-05-20 DIAGNOSIS — I10 Essential (primary) hypertension: Secondary | ICD-10-CM | POA: Diagnosis not present

## 2021-05-20 DIAGNOSIS — E1143 Type 2 diabetes mellitus with diabetic autonomic (poly)neuropathy: Secondary | ICD-10-CM | POA: Diagnosis not present

## 2021-05-20 LAB — GLUCOSE, CAPILLARY
Glucose-Capillary: 132 mg/dL — ABNORMAL HIGH (ref 70–99)
Glucose-Capillary: 136 mg/dL — ABNORMAL HIGH (ref 70–99)
Glucose-Capillary: 480 mg/dL — ABNORMAL HIGH (ref 70–99)
Glucose-Capillary: 76 mg/dL (ref 70–99)

## 2021-05-20 MED ORDER — HYDROCHLOROTHIAZIDE 25 MG PO TABS
50.0000 mg | ORAL_TABLET | Freq: Every day | ORAL | Status: DC
  Administered 2021-05-20: 50 mg via ORAL
  Filled 2021-05-20: qty 2

## 2021-05-20 MED ORDER — PNEUMOCOCCAL VAC POLYVALENT 25 MCG/0.5ML IJ INJ: 0.5000 mL | INJECTION | Freq: Once | INTRAMUSCULAR | 0 refills | Status: AC

## 2021-05-20 MED ORDER — INFLUENZA VAC SPLIT QUAD 0.5 ML IM SUSY
0.5000 mL | PREFILLED_SYRINGE | INTRAMUSCULAR | Status: DC | PRN
  Filled 2021-05-20: qty 0.5

## 2021-05-20 MED ORDER — PNEUMOCOCCAL VAC POLYVALENT 25 MCG/0.5ML IJ INJ: 0.5000 mL | INJECTION | INTRAMUSCULAR | Status: DC | PRN

## 2021-05-20 MED ORDER — ASPIRIN 81 MG PO TBEC: 81.0000 mg | DELAYED_RELEASE_TABLET | Freq: Every day | ORAL | 11 refills | Status: AC

## 2021-05-20 MED ORDER — GABAPENTIN 300 MG PO CAPS: 800.0000 mg | ORAL_CAPSULE | Freq: Three times a day (TID) | ORAL | 0 refills | Status: AC

## 2021-05-20 MED ORDER — HYDROCHLOROTHIAZIDE 50 MG PO TABS: 50.0000 mg | ORAL_TABLET | Freq: Every day | ORAL | 0 refills | Status: AC

## 2021-05-20 MED ORDER — INFLUENZA VAC SPLIT QUAD 0.5 ML IM SUSY: 0.5000 mL | PREFILLED_SYRINGE | Freq: Once | INTRAMUSCULAR | 0 refills | Status: AC

## 2021-05-20 MED ORDER — DOXYCYCLINE HYCLATE 50 MG PO CAPS: 50.0000 mg | ORAL_CAPSULE | Freq: Two times a day (BID) | ORAL | 0 refills | Status: AC

## 2021-05-20 MED ORDER — INSULIN DETEMIR 100 UNIT/ML ~~LOC~~ SOLN: 40.0000 [IU] | Freq: Every day | SUBCUTANEOUS | 0 refills | Status: AC

## 2021-05-20 MED ORDER — INSULIN DETEMIR 100 UNIT/ML ~~LOC~~ SOLN: 40.0000 [IU] | Freq: Every day | SUBCUTANEOUS | Status: DC

## 2021-05-20 MED ORDER — INSULIN ASPART 100 UNIT/ML IJ SOLN
12.0000 [IU] | Freq: Once | INTRAMUSCULAR | Status: AC
Start: 2021-05-20 — End: 2021-05-20
  Administered 2021-05-20: 12 [IU] via SUBCUTANEOUS
  Filled 2021-05-20: qty 1

## 2021-05-20 NOTE — Progress Notes (Signed)
On call provider notified "pt's BG- 403; 5 units of novolog administered along with levemir; he has been non compliant with meals.  BP is also elevated; currently down from 189/101 to 173/101 with HR ranging between 90's-110's and low grade temp.  PRN BP meds have been discontinued and I've given him 2 percocets.  Please advise.  Thanks."  Provider placed new orders; BG rechecked at 0030 and additional insulin prescribed.  Pt was hesitant and resistant to additional insulin stating that he was afraid of BG dropping in the morning but agreed due to BG increasing to 480 with recheck.  Pt's BG this morning was 76.      Pt did not require PRN dose of Hydralazine, BP decreased without intervention and temperature's currently down from 100 to 98.4 this am.

## 2021-05-20 NOTE — Care Management Important Message (Signed)
Important Message  Patient Details  Name: Samuel [name] MRN: [id number] Date of Birth: 11-Nov-1960   Medicare Important Message Given:  N/A - LOS <3 / Initial given by admissions     Samuel [name] 05/20/2021, 11:52 AM

## 2021-05-20 NOTE — Plan of Care (Signed)
Patient discharged home per MD orders at this time.All discharge instructions,education and medications reviewed with patient at the bedside.Pt expressed understanding and will comply with dc instructions.follow up appointments was also communicated to patient No verbal c/o or any ssx of distress.Pt discharged home with self care per order.patient transported self home in a privately owned vehicle.

## 2021-05-20 NOTE — Discharge Summary (Signed)
Physician Discharge Summary  Patient ID: Samuel [name] MRN: [id number] DOB/AGE: 12/26/1960 60 y.o.  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 He did meet sepsis criteria for RR, leukocytosis, suspected source. However, general surgery recommended against surgery as they did not think it was true appendicitis. His other symptoms included sore throat, earache and lower extremity erythema at site of TMA. His symptoms improved with Abx coverage for abdominal and skin flora (keflex, CTX, flagyl, ancef). The erythema of his extremity resolved. He was otherwise treated for his chronic conditions including poorly controlled diabetes and HTN and remained stable. He was discharged in improved condition to continue doxycycline and follow up with PCP for diabetes and HTN management.  Consults: general surgery  Significant Diagnostic Studies: CT abdomen/pelvis  Treatments: IV fluids, analgesia, Abx  Discharge Exam: Blood pressure (!) 155/92, pulse (!) 107, temperature 98.1 F (36.7 C), resp. rate 16, height 5\' 10"  (1.778 m), weight 72.6 kg, SpO2 95 %. Physical Exam Vitals and nursing note reviewed.  Constitutional:      General: He is not in acute distress.    Appearance: He is normal weight. He is not ill-appearing or toxic-appearing.  HENT:     Head: Normocephalic.      Right Ear: Tympanic membrane normal.     Left Ear: Tympanic membrane normal.     Mouth/Throat:     Mouth: Mucous membranes are moist.     Pharynx: Oropharynx is clear.  Eyes:     Conjunctiva/sclera: Conjunctivae normal.     Pupils: Pupils are equal, round, and reactive to light.  Cardiovascular:     Rate and Rhythm: Normal rate and regular rhythm.  Pulmonary:     Effort: Pulmonary effort is normal.     Breath sounds: Normal breath sounds.  Abdominal:     General: Abdomen is flat.     Palpations: Abdomen is soft.     Tenderness: There is abdominal tenderness (mild to RLQ. no guarding or rebound).  Musculoskeletal:        General: No swelling.     Cervical back: Normal range of motion.     Right lower leg: No edema.     Left lower leg: No edema.  Lymphadenopathy:     Cervical: No cervical adenopathy.  Skin:    General: Skin is warm and dry.     Capillary Refill: Capillary refill takes less than 2 seconds.     Findings: No erythema or rash.  Neurological:     General: No focal deficit present.     Mental Status: He is alert and oriented to person, place, and time.   Disposition: home Discharge Instructions     Discharge patient   Complete by: As directed    Discharge disposition: 01-Home or Self Care   Discharge patient date: 05/20/2021      Allergies as of 05/20/2021       Reactions  Fish Allergy Swelling        Medication List     TAKE these medications    amLODipine 10 MG tablet Commonly known as: NORVASC Take 1 tablet (10 mg total) by mouth daily.   aspirin 81 MG EC tablet Take 1 tablet (81 mg total) by mouth daily. Swallow whole. Start taking on: May 21, 2021 What changed: additional instructions   clopidogrel 75 MG tablet Commonly known as: PLAVIX Take 1 tablet (75 mg total) by mouth daily.   doxycycline 50 MG capsule Commonly known as: VIBRAMYCIN Take 1 capsule (50 mg total) by mouth 2 (two) times daily for 5 days.   gabapentin 300 MG  capsule Commonly known as: NEURONTIN Take 3 capsules (900 mg total) by mouth 3 (three) times daily for 7 days. What changed:  how much to take when to take this   hydrochlorothiazide 50 MG tablet Commonly known as: HYDRODIURIL Take 1 tablet (50 mg total) by mouth daily. Start taking on: May 21, 2021 What changed:  medication strength how much to take   influenza vac split quadrivalent PF 0.5 ML injection Commonly known as: FLUARIX Inject 0.5 mLs into the muscle once for 1 dose.   insulin detemir 100 UNIT/ML injection Commonly known as: LEVEMIR Inject 0.4 mLs (40 Units total) into the skin daily. What changed:  how much to take when to take this   levETIRAcetam 500 MG tablet Commonly known as: KEPPRA Take 500 mg by mouth daily.   OLANZapine 5 MG tablet Commonly known as: ZYPREXA Take 1 tablet (5 mg total) by mouth at bedtime.   oxyCODONE-acetaminophen 10-325 MG tablet Commonly known as: Percocet Take 1 tablet by mouth every 6 (six) hours as needed for pain.   pantoprazole 40 MG tablet Commonly known as: PROTONIX Take 1 tablet (40 mg total) by mouth 2 (two) times daily.   pneumococcal 23 valent vaccine 25 MCG/0.5ML injection Commonly known as: PNEUMOVAX-23 Inject 0.5 mLs into the muscle once for 1 dose.        Follow-up Information     Sherron Monday, MD. Go on 05/25/2021.   Specialty: Internal Medicine Why: Appt at 1:45 pm Contact information: [address] [place] [address] [phone]         Leafy Ro, MD Follow up on 06/10/2021.   Specialty: General Surgery Why: Hospital follow up: RLQ Pain, ? appendicitis at 845 Contact information: [address] [place] [address] [phone]                 Signed: Leeroy Bock 05/20/2021, 2:52 PM

## 2021-05-23 LAB — CULTURE, BLOOD (ROUTINE X 2)
Culture: NO GROWTH
Culture: NO GROWTH
Special Requests: ADEQUATE
Special Requests: ADEQUATE

## 2021-06-14 ENCOUNTER — Ambulatory Visit: Payer: Medicare PPO | Admitting: Surgery
